# Patient Record
Sex: Male | Born: 1950 | Race: White | Hispanic: No | Smoking: Former smoker
Health system: Southern US, Community
[De-identification: ages and names within clinical notes are randomized; demographics above are authoritative.]

## PROBLEM LIST (undated history)

## (undated) DIAGNOSIS — I10 Essential (primary) hypertension: Secondary | ICD-10-CM

## (undated) DIAGNOSIS — M1990 Unspecified osteoarthritis, unspecified site: Secondary | ICD-10-CM

## (undated) DIAGNOSIS — C679 Malignant neoplasm of bladder, unspecified: Secondary | ICD-10-CM

## (undated) DIAGNOSIS — G510 Bell's palsy: Secondary | ICD-10-CM

## (undated) DIAGNOSIS — G459 Transient cerebral ischemic attack, unspecified: Secondary | ICD-10-CM

## (undated) DIAGNOSIS — M48061 Spinal stenosis, lumbar region without neurogenic claudication: Secondary | ICD-10-CM

## (undated) DIAGNOSIS — K219 Gastro-esophageal reflux disease without esophagitis: Secondary | ICD-10-CM

## (undated) DIAGNOSIS — G4730 Sleep apnea, unspecified: Secondary | ICD-10-CM

## (undated) DIAGNOSIS — M109 Gout, unspecified: Secondary | ICD-10-CM

## (undated) DIAGNOSIS — N400 Enlarged prostate without lower urinary tract symptoms: Secondary | ICD-10-CM

## (undated) DIAGNOSIS — M5416 Radiculopathy, lumbar region: Secondary | ICD-10-CM

## (undated) HISTORY — PX: JOINT REPLACEMENT: SHX530

## (undated) HISTORY — PX: CYSTOSCOPY: SUR368

## (undated) HISTORY — PX: TONSILLECTOMY: SUR1361

## (undated) HISTORY — PX: KNEE ARTHROSCOPY: SUR90

---

## 2012-05-19 DIAGNOSIS — G510 Bell's palsy: Secondary | ICD-10-CM

## 2012-05-19 HISTORY — DX: Bell's palsy: G51.0

## 2014-05-19 HISTORY — PX: SHOULDER ARTHROSCOPY: SHX128

## 2015-05-20 HISTORY — PX: BLADDER TUMOR EXCISION: SHX238

## 2015-05-20 HISTORY — PX: BLEPHAROPLASTY: SUR158

## 2015-12-04 DIAGNOSIS — N401 Enlarged prostate with lower urinary tract symptoms: Secondary | ICD-10-CM | POA: Insufficient documentation

## 2015-12-04 DIAGNOSIS — C679 Malignant neoplasm of bladder, unspecified: Secondary | ICD-10-CM | POA: Insufficient documentation

## 2015-12-04 DIAGNOSIS — N138 Other obstructive and reflux uropathy: Secondary | ICD-10-CM | POA: Insufficient documentation

## 2016-07-14 DIAGNOSIS — M25562 Pain in left knee: Secondary | ICD-10-CM | POA: Insufficient documentation

## 2016-07-14 DIAGNOSIS — G8929 Other chronic pain: Secondary | ICD-10-CM | POA: Insufficient documentation

## 2016-11-06 ENCOUNTER — Other Ambulatory Visit: Payer: Self-pay | Admitting: Orthopedic Surgery

## 2016-11-26 NOTE — Pre-Procedure Instructions (Signed)
Niki Cosman  11/26/2016      [id number] [place], [place] [address] [place] [address] Phone: [phone] Fax: [phone]    Your procedure is scheduled on July 23  Report to [facility] Admitting at 0800 A.M.  Call this number if you have problems the morning of surgery:  [phone]   Remember:  Do not eat food or drink liquids after midnight.   Take these medicines the morning of surgery with A SIP OF WATER allopurinol (ZYLOPRIM),  amLODipine (NORVASC), eye drops, metoprolol succinate (TOPROL-XL), pantoprazole (PROTONIX), tamsulosin (FLOMAX)  7 days prior to surgery STOP taking any meloxicam (MOBIC), Aleve, Naproxen, Ibuprofen, Motrin, Advil, Goody's, BC's, all herbal medications, fish oil, and all vitamins  Follow your doctors instructions regarding your Aspirin.  If no instructions were given by the doctor you will need to call the office to get instructions.  Your pre admission RN will also call for those instructions    Do not wear jewelry  Do not wear lotions, powders, or cologne, or deoderant.  Men may shave face and neck.  Do not bring valuables to the hospital.  [facility] is not responsible for any belongings or valuables.  Contacts, dentures or bridgework may not be worn into surgery.  Leave your suitcase in the car.  After surgery it may be brought to your room.  For patients admitted to the hospital, discharge time will be determined by your treatment team.  Patients discharged the day of surgery will not be allowed to drive home.    Special instructions:   [place]- Preparing For Surgery  Before surgery, you can play an important role. Because skin is not sterile, your skin needs to be as free of germs as possible. You can reduce the number of germs on your skin by washing with CHG (chlorahexidine gluconate) Soap before surgery.  CHG is an antiseptic cleaner which kills germs and  bonds with the skin to continue killing germs even after washing.  Please do not use if you have an allergy to CHG or antibacterial soaps. If your skin becomes reddened/irritated stop using the CHG.  Do not shave (including legs and underarms) for at least 48 hours prior to first CHG shower. It is OK to shave your face.  Please follow these instructions carefully.   1. Shower the NIGHT BEFORE SURGERY and the MORNING OF SURGERY with CHG.   2. If you chose to wash your hair, wash your hair first as usual with your normal shampoo.  3. After you shampoo, rinse your hair and body thoroughly to remove the shampoo.  4. Use CHG as you would any other liquid soap. You can apply CHG directly to the skin and wash gently with a scrungie or a clean washcloth.   5. Apply the CHG Soap to your body ONLY FROM THE NECK DOWN.  Do not use on open wounds or open sores. Avoid contact with your eyes, ears, mouth and genitals (private parts). Wash genitals (private parts) with your normal soap.  6. Wash thoroughly, paying special attention to the area where your surgery will be performed.  7. Thoroughly rinse your body with warm water from the neck down.  8. DO NOT shower/wash with your normal soap after using and rinsing off the CHG Soap.  9. Pat yourself dry with a CLEAN TOWEL.   10. Wear CLEAN PAJAMAS   11. Place CLEAN  SHEETS on your bed the night of your first shower and DO NOT SLEEP WITH PETS.    Day of Surgery: Do not apply any deodorants/lotions. Please wear clean clothes to the hospital/surgery center.      Please read over the following fact sheets that you were given.

## 2016-11-27 ENCOUNTER — Encounter (HOSPITAL_COMMUNITY)
Admission: RE | Admit: 2016-11-27 | Discharge: 2016-11-27 | Disposition: A | Discharge status: Home / Self Care | Payer: Federal, State, Local not specified - PPO | Source: Ambulatory Visit | Attending: Orthopedic Surgery | Admitting: Orthopedic Surgery

## 2016-11-27 ENCOUNTER — Encounter (HOSPITAL_COMMUNITY): Payer: Self-pay

## 2016-11-27 DIAGNOSIS — Z01812 Encounter for preprocedural laboratory examination: Secondary | ICD-10-CM | POA: Diagnosis not present

## 2016-11-27 HISTORY — DX: Sleep apnea, unspecified: G47.30

## 2016-11-27 HISTORY — DX: Essential (primary) hypertension: I10

## 2016-11-27 HISTORY — DX: Unspecified osteoarthritis, unspecified site: M19.90

## 2016-11-27 HISTORY — DX: Gastro-esophageal reflux disease without esophagitis: K21.9

## 2016-11-27 LAB — CBC WITH DIFFERENTIAL/PLATELET
BASOS ABS: 0 10*3/uL (ref 0.0–0.1)
Basophils Relative: 0 %
EOS PCT: 1 %
Eosinophils Absolute: 0.1 10*3/uL (ref 0.0–0.7)
HCT: 41.7 % (ref 39.0–52.0)
Hemoglobin: 14.4 g/dL (ref 13.0–17.0)
LYMPHS PCT: 27 %
Lymphs Abs: 1.8 10*3/uL (ref 0.7–4.0)
MCH: 31 pg (ref 26.0–34.0)
MCHC: 34.5 g/dL (ref 30.0–36.0)
MCV: 89.7 fL (ref 78.0–100.0)
MONO ABS: 0.4 10*3/uL (ref 0.1–1.0)
MONOS PCT: 5 %
Neutro Abs: 4.6 10*3/uL (ref 1.7–7.7)
Neutrophils Relative %: 67 %
PLATELETS: 172 10*3/uL (ref 150–400)
RBC: 4.65 MIL/uL (ref 4.22–5.81)
RDW: 13.6 % (ref 11.5–15.5)
WBC: 6.8 10*3/uL (ref 4.0–10.5)

## 2016-11-27 LAB — COMPREHENSIVE METABOLIC PANEL
ALT: 23 U/L (ref 17–63)
ANION GAP: 8 (ref 5–15)
AST: 20 U/L (ref 15–41)
Albumin: 3.8 g/dL (ref 3.5–5.0)
Alkaline Phosphatase: 47 U/L (ref 38–126)
BILIRUBIN TOTAL: 0.7 mg/dL (ref 0.3–1.2)
BUN: 19 mg/dL (ref 6–20)
CHLORIDE: 109 mmol/L (ref 101–111)
CO2: 23 mmol/L (ref 22–32)
Calcium: 9.4 mg/dL (ref 8.9–10.3)
Creatinine, Ser: 0.84 mg/dL (ref 0.61–1.24)
Glucose, Bld: 111 mg/dL — ABNORMAL HIGH (ref 65–99)
POTASSIUM: 3.9 mmol/L (ref 3.5–5.1)
Sodium: 140 mmol/L (ref 135–145)
TOTAL PROTEIN: 6 g/dL — AB (ref 6.5–8.1)

## 2016-11-27 LAB — SURGICAL PCR SCREEN
MRSA, PCR: NEGATIVE
STAPHYLOCOCCUS AUREUS: NEGATIVE

## 2016-11-27 NOTE — Pre-Procedure Instructions (Signed)
William [name]  11/27/2016      [id number] , [place] [address] [place] [address] Phone: [phone] Fax: [phone]    Your procedure is scheduled on July 23  Report to [facility] Admitting at 0800 A.M.  Call this number if you have problems the morning of surgery:  [phone]   Remember:  Do not eat food or drink liquids after midnight.   On 7/22- Sunday night    Take these medicines the morning of surgery with A SIP OF WATER allopurinol (ZYLOPRIM),  amLODipine (NORVASC), eye drops, metoprolol succinate (TOPROL-XL), pantoprazole (PROTONIX), tamsulosin (FLOMAX)  7 days prior to surgery STOP taking any meloxicam (MOBIC), Aleve, Naproxen, Ibuprofen, Motrin, Advil, Goody's, BC's, all herbal medications, fish oil, and all vitamins  Follow your doctors instructions regarding your Aspirin.  If no instructions were given by the doctor you will need to call the office to get instructions.  Your pre admission RN will also call for those instructions    Do not wear jewelry  Do not wear lotions, powders, or cologne, or deoderant.  Men may shave face and neck.  Do not bring valuables to the hospital.  [facility] is not responsible for any belongings or valuables.  Contacts, dentures or bridgework may not be worn into surgery.  Leave your suitcase in the car.  After surgery it may be brought to your room.  For patients admitted to the hospital, discharge time will be determined by your treatment team.  Patients discharged the day of surgery will not be allowed to drive home.    Special instructions:   [place]- Preparing For Surgery  Before surgery, you can play an important role. Because skin is not sterile, your skin needs to be as free of germs as possible. You can reduce the number of germs on your skin by washing with CHG (chlorahexidine gluconate) Soap before surgery.  CHG is an antiseptic  cleaner which kills germs and bonds with the skin to continue killing germs even after washing.  Please do not use if you have an allergy to CHG or antibacterial soaps. If your skin becomes reddened/irritated stop using the CHG.  Do not shave (including legs and underarms) for at least 48 hours prior to first CHG shower. It is OK to shave your face.  Please follow these instructions carefully.   1. Shower the NIGHT BEFORE SURGERY and the MORNING OF SURGERY with CHG.   2. If you chose to wash your hair, wash your hair first as usual with your normal shampoo.  3. After you shampoo, rinse your hair and body thoroughly to remove the shampoo.  4. Use CHG as you would any other liquid soap. You can apply CHG directly to the skin and wash gently with a scrungie or a clean washcloth.   5. Apply the CHG Soap to your body ONLY FROM THE NECK DOWN.  Do not use on open wounds or open sores. Avoid contact with your eyes, ears, mouth and genitals (private parts). Wash genitals (private parts) with your normal soap.  6. Wash thoroughly, paying special attention to the area where your surgery will be performed.  7. Thoroughly rinse your body with warm water from the neck down.  8. DO NOT shower/wash with your normal soap after using and rinsing off the CHG Soap.  9. Pat yourself dry with a CLEAN TOWEL.   10.  [place]   11. Place CLEAN SHEETS on your bed the night of your first shower and DO NOT SLEEP WITH PETS.    Day of Surgery: Do not apply any deodorants/lotions. Please wear clean clothes to the hospital/surgery center.      Please read over the following fact sheets that you were given.

## 2016-11-27 NOTE — Progress Notes (Signed)
Pt. explains that he went to PCP- Dr. Humphrey Rolls with [facility]. For surg. Clearance.  Ekg done & found to have some concern.  Pt. Referred to [facility]. & the stress test done proved to be negative. Requesting EKG & stress test. Pt. Denies all concerns for changes in his chest.

## 2016-11-28 NOTE — Progress Notes (Addendum)
Anesthesia Chart Review:  Pt is a 66 year old male scheduled for R total knee arthroplasty on 12/08/16 with Vickey Huger, M.D.  - PCP is Bertram Millard, MD who cleared pt for surgery.   PMH includes: HTN, OSA, GERD. Former smoker. BMI 28.  Medications include: Amlodipine, ASA 81 mg, Lipitor, losartan, metoprolol, Protonix  BP 136/73   Pulse 77   Temp (!) 36.3 C   Resp 20   Ht 5' 11.5" (1.816 m)   Wt 201 lb 9.6 oz (91.4 kg)   SpO2 96%   BMI 27.73 kg/m    Preoperative labs reviewed.    EKG 10/17/16: sinus rhythm with borderline 1st degree AV block. RAD.  Lateral infarct, age undetermined.    Nuclear stress test 10/22/16 ([place]): 1. No ischemia seen on the scan. 2. Normal gated images. 3. Normal EF calculated to be 64%.  If no changes, I anticipate pt can proceed with surgery as scheduled.   Willeen Cass, FNP-BC [facility] Short Stay Surgical Center/Anesthesiology Phone: [phone] 12/01/2016 9:20 AM

## 2016-12-05 MED ORDER — GABAPENTIN 300 MG PO CAPS
300.0000 mg | ORAL_CAPSULE | Freq: Once | ORAL | Status: AC
  Administered 2016-12-08: 300 mg via ORAL
  Filled 2016-12-05: qty 1

## 2016-12-05 MED ORDER — ACETAMINOPHEN 500 MG PO TABS
1000.0000 mg | ORAL_TABLET | Freq: Once | ORAL | Status: AC
  Administered 2016-12-08: 1000 mg via ORAL
  Filled 2016-12-05: qty 2

## 2016-12-05 MED ORDER — TRANEXAMIC ACID 1000 MG/10ML IV SOLN
1000.0000 mg | INTRAVENOUS | Status: AC
  Administered 2016-12-08: 1000 mg via INTRAVENOUS
  Filled 2016-12-05: qty 1100

## 2016-12-05 MED ORDER — CEFAZOLIN SODIUM-DEXTROSE 2-4 GM/100ML-% IV SOLN
2.0000 g | INTRAVENOUS | Status: AC
  Administered 2016-12-08: 2 g via INTRAVENOUS
  Filled 2016-12-05: qty 100

## 2016-12-05 MED ORDER — BUPIVACAINE LIPOSOME 1.3 % IJ SUSP
20.0000 mL | INTRAMUSCULAR | Status: AC
  Administered 2016-12-08: 20 mL
  Filled 2016-12-05: qty 20

## 2016-12-05 MED ORDER — DEXAMETHASONE SODIUM PHOSPHATE 10 MG/ML IJ SOLN
8.0000 mg | INTRAMUSCULAR | Status: AC
  Administered 2016-12-08: 8 mg via INTRAVENOUS
  Filled 2016-12-05: qty 1

## 2016-12-08 ENCOUNTER — Encounter (HOSPITAL_COMMUNITY)
Admission: RE | Disposition: A | Discharge status: Home / Self Care | Payer: Self-pay | Source: Ambulatory Visit | Attending: Orthopedic Surgery

## 2016-12-08 ENCOUNTER — Ambulatory Visit (HOSPITAL_COMMUNITY): Payer: Federal, State, Local not specified - PPO | Admitting: Certified Registered"

## 2016-12-08 ENCOUNTER — Observation Stay (HOSPITAL_COMMUNITY)
Admission: RE | Admit: 2016-12-08 | Discharge: 2016-12-09 | Disposition: A | Discharge status: Home / Self Care | Payer: Federal, State, Local not specified - PPO | Source: Ambulatory Visit | Attending: Orthopedic Surgery | Admitting: Orthopedic Surgery

## 2016-12-08 ENCOUNTER — Ambulatory Visit (HOSPITAL_COMMUNITY): Payer: Federal, State, Local not specified - PPO | Admitting: Emergency Medicine

## 2016-12-08 ENCOUNTER — Encounter (HOSPITAL_COMMUNITY): Payer: Self-pay | Admitting: Urology

## 2016-12-08 DIAGNOSIS — M1711 Unilateral primary osteoarthritis, right knee: Secondary | ICD-10-CM | POA: Diagnosis not present

## 2016-12-08 DIAGNOSIS — I10 Essential (primary) hypertension: Secondary | ICD-10-CM | POA: Diagnosis not present

## 2016-12-08 DIAGNOSIS — Z87891 Personal history of nicotine dependence: Secondary | ICD-10-CM | POA: Insufficient documentation

## 2016-12-08 DIAGNOSIS — Z882 Allergy status to sulfonamides status: Secondary | ICD-10-CM | POA: Insufficient documentation

## 2016-12-08 DIAGNOSIS — G4730 Sleep apnea, unspecified: Secondary | ICD-10-CM | POA: Insufficient documentation

## 2016-12-08 DIAGNOSIS — Z79899 Other long term (current) drug therapy: Secondary | ICD-10-CM | POA: Insufficient documentation

## 2016-12-08 DIAGNOSIS — Z9889 Other specified postprocedural states: Secondary | ICD-10-CM | POA: Diagnosis not present

## 2016-12-08 DIAGNOSIS — K219 Gastro-esophageal reflux disease without esophagitis: Secondary | ICD-10-CM | POA: Diagnosis not present

## 2016-12-08 DIAGNOSIS — Z96659 Presence of unspecified artificial knee joint: Secondary | ICD-10-CM

## 2016-12-08 HISTORY — PX: TOTAL KNEE ARTHROPLASTY: SHX125

## 2016-12-08 SURGERY — ARTHROPLASTY, KNEE, TOTAL
Anesthesia: Spinal | Site: Knee | Laterality: Right

## 2016-12-08 MED ORDER — PROMETHAZINE HCL 25 MG/ML IJ SOLN: 6.2500 mg | INTRAMUSCULAR | Status: DC | PRN

## 2016-12-08 MED ORDER — ASPIRIN EC 325 MG PO TBEC
325.0000 mg | DELAYED_RELEASE_TABLET | Freq: Two times a day (BID) | ORAL | Status: DC
  Administered 2016-12-08 – 2016-12-09 (×2): 325 mg via ORAL
  Filled 2016-12-08 (×2): qty 1

## 2016-12-08 MED ORDER — LOSARTAN POTASSIUM 50 MG PO TABS
100.0000 mg | ORAL_TABLET | Freq: Every day | ORAL | Status: DC
  Administered 2016-12-09: 100 mg via ORAL
  Filled 2016-12-08: qty 2

## 2016-12-08 MED ORDER — ONDANSETRON HCL 4 MG/2ML IJ SOLN
INTRAMUSCULAR | Status: DC | PRN
  Administered 2016-12-08: 4 mg via INTRAVENOUS

## 2016-12-08 MED ORDER — FENTANYL CITRATE (PF) 100 MCG/2ML IJ SOLN
INTRAMUSCULAR | Status: AC
  Administered 2016-12-08: 50 ug via INTRAVENOUS
  Filled 2016-12-08: qty 2

## 2016-12-08 MED ORDER — ONDANSETRON HCL 4 MG/2ML IJ SOLN
INTRAMUSCULAR | Status: AC
  Filled 2016-12-08: qty 2

## 2016-12-08 MED ORDER — SODIUM CHLORIDE 0.9 % IV SOLN
1000.0000 mg | Freq: Once | INTRAVENOUS | Status: AC
  Administered 2016-12-08: 1000 mg via INTRAVENOUS
  Filled 2016-12-08: qty 10

## 2016-12-08 MED ORDER — HYDROMORPHONE HCL 1 MG/ML IJ SOLN: 0.2500 mg | INTRAMUSCULAR | Status: DC | PRN

## 2016-12-08 MED ORDER — FLEET ENEMA 7-19 GM/118ML RE ENEM: 1.0000 | ENEMA | Freq: Once | RECTAL | Status: DC | PRN

## 2016-12-08 MED ORDER — EPHEDRINE 5 MG/ML INJ
INTRAVENOUS | Status: AC
  Filled 2016-12-08: qty 10

## 2016-12-08 MED ORDER — CHLORHEXIDINE GLUCONATE 4 % EX LIQD: 60.0000 mL | Freq: Once | CUTANEOUS | Status: DC

## 2016-12-08 MED ORDER — ZOLPIDEM TARTRATE 5 MG PO TABS: 5.0000 mg | ORAL_TABLET | Freq: Every evening | ORAL | Status: DC | PRN

## 2016-12-08 MED ORDER — FENTANYL CITRATE (PF) 100 MCG/2ML IJ SOLN
50.0000 ug | Freq: Once | INTRAMUSCULAR | Status: AC
  Administered 2016-12-08: 50 ug via INTRAVENOUS

## 2016-12-08 MED ORDER — OXYCODONE HCL 5 MG PO TABS
5.0000 mg | ORAL_TABLET | ORAL | Status: DC | PRN
  Administered 2016-12-08 – 2016-12-09 (×2): 10 mg via ORAL
  Filled 2016-12-08 (×3): qty 2

## 2016-12-08 MED ORDER — PROPOFOL 500 MG/50ML IV EMUL
INTRAVENOUS | Status: DC | PRN
  Administered 2016-12-08: 75 ug/kg/min via INTRAVENOUS

## 2016-12-08 MED ORDER — BISACODYL 5 MG PO TBEC: 5.0000 mg | DELAYED_RELEASE_TABLET | Freq: Every day | ORAL | Status: DC | PRN

## 2016-12-08 MED ORDER — MENTHOL 3 MG MT LOZG: 1.0000 | LOZENGE | OROMUCOSAL | Status: DC | PRN

## 2016-12-08 MED ORDER — CEFAZOLIN SODIUM-DEXTROSE 1-4 GM/50ML-% IV SOLN
1.0000 g | Freq: Four times a day (QID) | INTRAVENOUS | Status: AC
  Administered 2016-12-08 – 2016-12-09 (×2): 1 g via INTRAVENOUS
  Filled 2016-12-08 (×2): qty 50

## 2016-12-08 MED ORDER — DOCUSATE SODIUM 100 MG PO CAPS
100.0000 mg | ORAL_CAPSULE | Freq: Two times a day (BID) | ORAL | Status: DC
  Administered 2016-12-08 – 2016-12-09 (×2): 100 mg via ORAL
  Filled 2016-12-08 (×2): qty 1

## 2016-12-08 MED ORDER — MIDAZOLAM HCL 2 MG/2ML IJ SOLN
INTRAMUSCULAR | Status: AC
  Filled 2016-12-08: qty 2

## 2016-12-08 MED ORDER — ACETAMINOPHEN 500 MG PO TABS
1000.0000 mg | ORAL_TABLET | Freq: Four times a day (QID) | ORAL | Status: AC
  Administered 2016-12-08 – 2016-12-09 (×4): 1000 mg via ORAL
  Filled 2016-12-08 (×4): qty 2

## 2016-12-08 MED ORDER — PANTOPRAZOLE SODIUM 40 MG PO TBEC
40.0000 mg | DELAYED_RELEASE_TABLET | Freq: Every day | ORAL | Status: DC
  Administered 2016-12-09: 40 mg via ORAL
  Filled 2016-12-08: qty 1

## 2016-12-08 MED ORDER — BUPIVACAINE IN DEXTROSE 0.75-8.25 % IT SOLN
INTRATHECAL | Status: DC | PRN
  Administered 2016-12-08: 2 mL via INTRATHECAL

## 2016-12-08 MED ORDER — SODIUM CHLORIDE 0.9 % IJ SOLN
INTRAMUSCULAR | Status: DC | PRN
  Administered 2016-12-08: 20 mL

## 2016-12-08 MED ORDER — PHENYLEPHRINE HCL 10 MG/ML IJ SOLN
INTRAMUSCULAR | Status: DC | PRN
  Administered 2016-12-08: 80 ug via INTRAVENOUS
  Administered 2016-12-08: 40 ug via INTRAVENOUS

## 2016-12-08 MED ORDER — ACETAMINOPHEN 650 MG RE SUPP: 650.0000 mg | Freq: Four times a day (QID) | RECTAL | Status: DC | PRN

## 2016-12-08 MED ORDER — PHENOL 1.4 % MT LIQD: 1.0000 | OROMUCOSAL | Status: DC | PRN

## 2016-12-08 MED ORDER — METOCLOPRAMIDE HCL 5 MG/ML IJ SOLN: 5.0000 mg | Freq: Three times a day (TID) | INTRAMUSCULAR | Status: DC | PRN

## 2016-12-08 MED ORDER — ALUM & MAG HYDROXIDE-SIMETH 200-200-20 MG/5ML PO SUSP: 30.0000 mL | ORAL | Status: DC | PRN

## 2016-12-08 MED ORDER — DEXAMETHASONE SODIUM PHOSPHATE 10 MG/ML IJ SOLN
10.0000 mg | Freq: Once | INTRAMUSCULAR | Status: AC
  Administered 2016-12-09: 10 mg via INTRAVENOUS
  Filled 2016-12-08: qty 1

## 2016-12-08 MED ORDER — METOPROLOL SUCCINATE ER 100 MG PO TB24
100.0000 mg | ORAL_TABLET | Freq: Every day | ORAL | Status: DC
  Administered 2016-12-09: 100 mg via ORAL
  Filled 2016-12-08: qty 1

## 2016-12-08 MED ORDER — AMLODIPINE BESYLATE 5 MG PO TABS
5.0000 mg | ORAL_TABLET | Freq: Every day | ORAL | Status: DC
  Administered 2016-12-09: 5 mg via ORAL
  Filled 2016-12-08: qty 1

## 2016-12-08 MED ORDER — MIDAZOLAM HCL 2 MG/2ML IJ SOLN
INTRAMUSCULAR | Status: AC
  Administered 2016-12-08: 1 mg via INTRAVENOUS
  Filled 2016-12-08: qty 2

## 2016-12-08 MED ORDER — METOCLOPRAMIDE HCL 5 MG PO TABS: 5.0000 mg | ORAL_TABLET | Freq: Three times a day (TID) | ORAL | Status: DC | PRN

## 2016-12-08 MED ORDER — SODIUM CHLORIDE 0.9 % IR SOLN
Status: DC | PRN
  Administered 2016-12-08: 1000 mL
  Administered 2016-12-08: 3000 mL

## 2016-12-08 MED ORDER — HYDROCODONE-ACETAMINOPHEN 7.5-325 MG PO TABS
1.0000 | ORAL_TABLET | Freq: Four times a day (QID) | ORAL | Status: DC
  Administered 2016-12-08 – 2016-12-09 (×3): 1 via ORAL
  Filled 2016-12-08 (×3): qty 1

## 2016-12-08 MED ORDER — METHOCARBAMOL 1000 MG/10ML IJ SOLN
500.0000 mg | Freq: Four times a day (QID) | INTRAVENOUS | Status: DC | PRN
  Filled 2016-12-08: qty 5

## 2016-12-08 MED ORDER — METHOCARBAMOL 500 MG PO TABS
500.0000 mg | ORAL_TABLET | Freq: Four times a day (QID) | ORAL | Status: DC | PRN
  Administered 2016-12-08: 500 mg via ORAL
  Filled 2016-12-08 (×2): qty 1

## 2016-12-08 MED ORDER — ONDANSETRON HCL 4 MG PO TABS: 4.0000 mg | ORAL_TABLET | Freq: Four times a day (QID) | ORAL | Status: DC | PRN

## 2016-12-08 MED ORDER — HYDROMORPHONE HCL 1 MG/ML IJ SOLN: 1.0000 mg | INTRAMUSCULAR | Status: DC | PRN

## 2016-12-08 MED ORDER — PHENYLEPHRINE 40 MCG/ML (10ML) SYRINGE FOR IV PUSH (FOR BLOOD PRESSURE SUPPORT)
PREFILLED_SYRINGE | INTRAVENOUS | Status: AC
  Filled 2016-12-08: qty 10

## 2016-12-08 MED ORDER — FENTANYL CITRATE (PF) 250 MCG/5ML IJ SOLN
INTRAMUSCULAR | Status: AC
  Filled 2016-12-08: qty 5

## 2016-12-08 MED ORDER — BUPIVACAINE-EPINEPHRINE (PF) 0.25% -1:200000 IJ SOLN
INTRAMUSCULAR | Status: DC | PRN
  Administered 2016-12-08: 30 mL

## 2016-12-08 MED ORDER — DIPHENHYDRAMINE HCL 12.5 MG/5ML PO ELIX: 12.5000 mg | ORAL_SOLUTION | ORAL | Status: DC | PRN

## 2016-12-08 MED ORDER — ACETAMINOPHEN 325 MG PO TABS: 650.0000 mg | ORAL_TABLET | Freq: Four times a day (QID) | ORAL | Status: DC | PRN

## 2016-12-08 MED ORDER — SENNOSIDES-DOCUSATE SODIUM 8.6-50 MG PO TABS: 1.0000 | ORAL_TABLET | Freq: Every evening | ORAL | Status: DC | PRN

## 2016-12-08 MED ORDER — GABAPENTIN 300 MG PO CAPS
300.0000 mg | ORAL_CAPSULE | Freq: Three times a day (TID) | ORAL | Status: DC
  Administered 2016-12-08 – 2016-12-09 (×2): 300 mg via ORAL
  Filled 2016-12-08 (×2): qty 1

## 2016-12-08 MED ORDER — PROPOFOL 10 MG/ML IV BOLUS
INTRAVENOUS | Status: AC
  Filled 2016-12-08: qty 20

## 2016-12-08 MED ORDER — ONDANSETRON HCL 4 MG/2ML IJ SOLN: 4.0000 mg | Freq: Four times a day (QID) | INTRAMUSCULAR | Status: DC | PRN

## 2016-12-08 MED ORDER — TAMSULOSIN HCL 0.4 MG PO CAPS
0.4000 mg | ORAL_CAPSULE | Freq: Two times a day (BID) | ORAL | Status: DC
  Administered 2016-12-08 – 2016-12-09 (×2): 0.4 mg via ORAL
  Filled 2016-12-08 (×2): qty 1

## 2016-12-08 MED ORDER — ATORVASTATIN CALCIUM 10 MG PO TABS
10.0000 mg | ORAL_TABLET | Freq: Every day | ORAL | Status: DC
  Administered 2016-12-09: 10 mg via ORAL
  Filled 2016-12-08: qty 1

## 2016-12-08 MED ORDER — ALLOPURINOL 100 MG PO TABS
100.0000 mg | ORAL_TABLET | Freq: Every day | ORAL | Status: DC
  Administered 2016-12-09: 100 mg via ORAL
  Filled 2016-12-08: qty 1

## 2016-12-08 MED ORDER — EPHEDRINE SULFATE 50 MG/ML IJ SOLN
INTRAMUSCULAR | Status: DC | PRN
  Administered 2016-12-08 (×2): 10 mg via INTRAVENOUS
  Administered 2016-12-08: 5 mg via INTRAVENOUS

## 2016-12-08 MED ORDER — LACTATED RINGERS IV SOLN
INTRAVENOUS | Status: DC
  Administered 2016-12-08 (×2): via INTRAVENOUS

## 2016-12-08 MED ORDER — MIDAZOLAM HCL 2 MG/2ML IJ SOLN
1.0000 mg | Freq: Once | INTRAMUSCULAR | Status: AC
  Administered 2016-12-08: 1 mg via INTRAVENOUS

## 2016-12-08 MED ORDER — FENTANYL CITRATE (PF) 100 MCG/2ML IJ SOLN
INTRAMUSCULAR | Status: DC | PRN
  Administered 2016-12-08: 50 ug via INTRAVENOUS

## 2016-12-08 SURGICAL SUPPLY — 61 items
BANDAGE ACE 6X5 VEL STRL LF (GAUZE/BANDAGES/DRESSINGS) ×3 IMPLANT
BANDAGE ELASTIC 6 VELCRO ST LF (GAUZE/BANDAGES/DRESSINGS) ×3 IMPLANT
BANDAGE ESMARK 6X9 LF (GAUZE/BANDAGES/DRESSINGS) ×1 IMPLANT
BLADE SAGITTAL 13X1.27X60 (BLADE) ×2 IMPLANT
BLADE SAGITTAL 13X1.27X60MM (BLADE) ×1
BLADE SAW SGTL 83.5X18.5 (BLADE) ×3 IMPLANT
BLADE SURG 10 STRL SS (BLADE) ×3 IMPLANT
BNDG ESMARK 6X9 LF (GAUZE/BANDAGES/DRESSINGS) ×3
BOWL SMART MIX CTS (DISPOSABLE) ×3 IMPLANT
CAPT KNEE TOTAL 3 ×3 IMPLANT
CEMENT BONE SIMPLEX SPEEDSET (Cement) ×6 IMPLANT
CLOSURE WOUND 1/2 X4 (GAUZE/BANDAGES/DRESSINGS) ×1
COVER SURGICAL LIGHT HANDLE (MISCELLANEOUS) ×3 IMPLANT
CUFF TOURNIQUET SINGLE 34IN LL (TOURNIQUET CUFF) ×3 IMPLANT
DRAPE EXTREMITY T 121X128X90 (DRAPE) ×3 IMPLANT
DRAPE HALF SHEET 40X57 (DRAPES) ×3 IMPLANT
DRAPE INCISE IOBAN 66X45 STRL (DRAPES) ×6 IMPLANT
DRAPE U-SHAPE 47X51 STRL (DRAPES) ×3 IMPLANT
DRSG AQUACEL AG ADV 3.5X10 (GAUZE/BANDAGES/DRESSINGS) ×3 IMPLANT
DURAPREP 26ML APPLICATOR (WOUND CARE) ×6 IMPLANT
ELECT REM PT RETURN 9FT ADLT (ELECTROSURGICAL) ×3
ELECTRODE REM PT RTRN 9FT ADLT (ELECTROSURGICAL) ×1 IMPLANT
FILTER STRAW FLUID ASPIR (MISCELLANEOUS) IMPLANT
GLOVE BIOGEL M 7.0 STRL (GLOVE) ×3 IMPLANT
GLOVE BIOGEL PI IND STRL 7.5 (GLOVE) ×1 IMPLANT
GLOVE BIOGEL PI IND STRL 8.5 (GLOVE) ×1 IMPLANT
GLOVE BIOGEL PI INDICATOR 7.5 (GLOVE) ×2
GLOVE BIOGEL PI INDICATOR 8.5 (GLOVE) ×2
GLOVE SURG ORTHO 8.0 STRL STRW (GLOVE) ×6 IMPLANT
GOWN STRL REUS W/ TWL LRG LVL3 (GOWN DISPOSABLE) ×1 IMPLANT
GOWN STRL REUS W/ TWL XL LVL3 (GOWN DISPOSABLE) ×2 IMPLANT
GOWN STRL REUS W/TWL 2XL LVL3 (GOWN DISPOSABLE) ×3 IMPLANT
GOWN STRL REUS W/TWL LRG LVL3 (GOWN DISPOSABLE) ×2
GOWN STRL REUS W/TWL XL LVL3 (GOWN DISPOSABLE) ×4
HANDPIECE INTERPULSE COAX TIP (DISPOSABLE) ×2
HOOD PEEL AWAY FACE SHEILD DIS (HOOD) ×9 IMPLANT
KIT BASIN OR (CUSTOM PROCEDURE TRAY) ×3 IMPLANT
KIT ROOM TURNOVER OR (KITS) ×3 IMPLANT
KNEE CAPITATED TOTAL 3 ×1 IMPLANT
MANIFOLD NEPTUNE II (INSTRUMENTS) ×3 IMPLANT
NEEDLE 18GX1X1/2 (RX/OR ONLY) (NEEDLE) IMPLANT
NEEDLE 22X1 1/2 (OR ONLY) (NEEDLE) ×6 IMPLANT
NS IRRIG 1000ML POUR BTL (IV SOLUTION) ×3 IMPLANT
PACK TOTAL JOINT (CUSTOM PROCEDURE TRAY) ×3 IMPLANT
PAD ARMBOARD 7.5X6 YLW CONV (MISCELLANEOUS) ×3 IMPLANT
SET HNDPC FAN SPRY TIP SCT (DISPOSABLE) ×1 IMPLANT
STRIP CLOSURE SKIN 1/2X4 (GAUZE/BANDAGES/DRESSINGS) ×2 IMPLANT
SUCTION FRAZIER HANDLE 10FR (MISCELLANEOUS)
SUCTION TUBE FRAZIER 10FR DISP (MISCELLANEOUS) IMPLANT
SUT MNCRL AB 3-0 PS2 18 (SUTURE) ×3 IMPLANT
SUT VIC AB 0 CTB1 27 (SUTURE) ×6 IMPLANT
SUT VIC AB 1 CT1 27 (SUTURE) ×6
SUT VIC AB 1 CT1 27XBRD ANBCTR (SUTURE) ×3 IMPLANT
SUT VIC AB 2-0 CT1 27 (SUTURE) ×4
SUT VIC AB 2-0 CT1 TAPERPNT 27 (SUTURE) ×2 IMPLANT
SYR 20CC LL (SYRINGE) ×6 IMPLANT
SYR TB 1ML LUER SLIP (SYRINGE) IMPLANT
TOWEL OR 17X24 6PK STRL BLUE (TOWEL DISPOSABLE) ×3 IMPLANT
TOWEL OR 17X26 10 PK STRL BLUE (TOWEL DISPOSABLE) ×3 IMPLANT
TRAY FOLEY CATH 14FR (SET/KITS/TRAYS/PACK) ×3 IMPLANT
WRAP KNEE MAXI GEL POST OP (GAUZE/BANDAGES/DRESSINGS) ×3 IMPLANT

## 2016-12-08 NOTE — Transfer of Care (Signed)
Immediate Anesthesia Transfer of Care Note  Patient: William [name]  Procedure(s) Performed: Procedure(s): TOTAL KNEE ARTHROPLASTY (Right)  Patient Location: PACU  Anesthesia Type:MAC and Spinal  Level of Consciousness: drowsy and patient cooperative  Airway & Oxygen Therapy: Patient Spontanous Breathing and Patient connected to nasal cannula oxygen  Post-op Assessment: Report given to RN  Post vital signs: Reviewed and stable  Last Vitals:  Vitals:   12/08/16 1055 12/08/16 1100  BP: (!) 115/59 (!) 138/54  Pulse: (!) 59 (!) 59  Resp: 19 17  Temp:      Last Pain:  Vitals:   12/08/16 0810  TempSrc: Oral         Complications: No apparent anesthesia complications

## 2016-12-08 NOTE — Evaluation (Addendum)
Physical Therapy Evaluation Patient Details Name: William [name] MRN: [id number] DOB: 06/20/1950 Today's Date: 12/08/2016   History of Present Illness  pt is a 66 y/o male with pmh of HTN, leep apnea and OA of bil knees, admitted for elective R TKA.  Clinical Impression  Pt admitted with/for elective R tKA.  Pt currently limited functionally due to the problems listed below.  (see problems list.)  Pt will benefit from PT to maximize function and safety to be able to get home safely with available assist .     Follow Up Recommendations DC plan and follow up therapy as arranged by surgeon    Equipment Recommendations  None recommended by PT    Recommendations for Other Services       Precautions / Restrictions Precautions Precautions: Knee Restrictions Weight Bearing Restrictions: Yes RLE Weight Bearing: Weight bearing as tolerated      Mobility  Bed Mobility Overal bed mobility: Needs Assistance Bed Mobility: Supine to Sit;Sit to Supine     Supine to sit: Supervision        Transfers Overall transfer level: Needs assistance   Transfers: Sit to/from Stand Sit to Stand: Min guard         General transfer comment: cues for hand placement and general technique  Ambulation/Gait Ambulation/Gait assistance: Min guard Ambulation Distance (Feet): 150 Feet Assistive device: Rolling walker (2 wheeled) Gait Pattern/deviations: Step-to pattern   Gait velocity interpretation: Below normal speed for age/gender General Gait Details: generally steady with good R knee control. Cues for sequencing   Stairs            Wheelchair Mobility    Modified Rankin (Stroke Patients Only)       Balance Overall balance assessment: No apparent balance deficits (not formally assessed)                                           Pertinent Vitals/Pain Pain Assessment: Faces Faces Pain Scale: Hurts even more Pain Location: R knee Pain Descriptors /  Indicators: Aching;Sore Pain Intervention(s): Monitored during session    Home Living Family/patient expects to be discharged to:: Private residence Living Arrangements: Spouse/significant other Available Help at Discharge: Family;Available 24 hours/day Type of Home: House Home Access: Stairs to enter   [employer] of Steps: 1 Home Layout: Two level;Other (Comment) (will stay on basement level initially) Home Equipment: Walker - 2 wheels;Bedside commode      Prior Function Level of Independence: Independent               Hand Dominance        Extremity/Trunk Assessment   Upper Extremity Assessment Upper Extremity Assessment: Overall WFL for tasks assessed    Lower Extremity Assessment Lower Extremity Assessment: Overall WFL for tasks assessed;RLE deficits/detail RLE Deficits / Details: functional strength for control in w/bearing       Communication   Communication: No difficulties  Cognition Arousal/Alertness: Awake/alert Behavior During Therapy: WFL for tasks assessed/performed Overall Cognitive Status: Within Functional Limits for tasks assessed                                        General Comments      Exercises Total Joint Exercises Ankle Circles/Pumps: AROM;20 reps;Supine Quad Sets: AROM;Both;10 reps;Supine Gluteal Sets: AROM;Both;10 reps;Supine  Short Arc Quad: AROM;Right;10 reps;Supine Heel Slides: AROM;Right;10 reps;Supine Hip ABduction/ADduction: AROM;Right;10 reps;Supine Straight Leg Raises: AROM;Right;10 reps;Supine Knee Flexion: AROM;Right;10 reps;Supine Goniometric ROM: 110   Assessment/Plan    PT Assessment Patient needs continued PT services  PT Problem List Decreased range of motion;Decreased activity tolerance;Decreased mobility;Decreased knowledge of use of DME;Pain       PT Treatment Interventions Gait training;Stair training;DME instruction;Functional mobility training;Therapeutic  activities;Therapeutic exercise;Patient/family education    PT Goals (Current goals can be found in the Care Plan section)  Acute Rehab PT Goals Patient Stated Goal: home independent PT Goal Formulation: With patient Time For Goal Achievement: 12/15/16 Potential to Achieve Goals: Good    Frequency 7X/week   Barriers to discharge        Co-evaluation               AM-PAC PT "6 Clicks" Daily Activity  Outcome Measure Difficulty turning over in bed (including adjusting bedclothes, sheets and blankets)?: None Difficulty moving from lying on back to sitting on the side of the bed? : None Difficulty sitting down on and standing up from a chair with arms (e.g., wheelchair, bedside commode, etc,.)?: A Little Help needed moving to and from a bed to chair (including a wheelchair)?: A Little Help needed walking in hospital room?: A Little Help needed climbing 3-5 steps with a railing? : A Little 6 Click Score: 20    End of Session   Activity Tolerance: Patient tolerated treatment well Patient left: in chair;with call bell/phone within reach;with family/visitor present Nurse Communication: Mobility status PT Visit Diagnosis: Other abnormalities of gait and mobility (R26.89);Pain Pain - Right/Left: Right Pain - part of body: Knee    Time: 1725-1750 PT Time Calculation (min) (ACUTE ONLY): 25 min   Charges:   PT Evaluation $PT Eval Low Complexity: 1 Procedure PT Treatments $Therapeutic Exercise: 8-22 mins   PT G Codes:   PT G-Codes **NOT FOR INPATIENT CLASS** Functional Assessment Tool Used: AM-PAC 6 Clicks Basic Mobility Functional Limitation: Mobility: Walking and moving around Mobility: Walking and Moving Around Current Status (K9983): At least 1 percent but less than 20 percent impaired, limited or restricted Mobility: Walking and Moving Around Goal Status [id number]): At least 1 percent but less than 20 percent impaired, limited or restricted    12/08/2016  Donnella Sham, PT [phone]  (pager)  Tessie Fass Miche Loughridge 12/08/2016, 6:03 PM

## 2016-12-08 NOTE — Anesthesia Procedure Notes (Signed)
Procedure Name: MAC Date/Time: 12/08/2016 8:28 AM Performed by: Barrington Ellison Pre-anesthesia Checklist: Patient identified, Emergency Drugs available, Suction available and Patient being monitored Patient Re-evaluated:Patient Re-evaluated prior to induction Oxygen Delivery Method: Simple face mask

## 2016-12-08 NOTE — Anesthesia Postprocedure Evaluation (Signed)
Anesthesia Post Note  Patient: William [name]  Procedure(s) Performed: Procedure(s) (LRB): TOTAL KNEE ARTHROPLASTY (Right)     Patient location during evaluation: PACU Anesthesia Type: Spinal Level of consciousness: oriented and awake and alert Pain management: pain level controlled Vital Signs Assessment: post-procedure vital signs reviewed and stable Respiratory status: spontaneous breathing, respiratory function stable and patient connected to nasal cannula oxygen Cardiovascular status: blood pressure returned to baseline and stable Postop Assessment: no headache and no backache Anesthetic complications: no    Last Vitals:  Vitals:   12/08/16 1430 12/08/16 1442  BP: (!) 147/79 (!) 146/74  Pulse: 71 71  Resp: 15 17  Temp:      Last Pain:  Vitals:   12/08/16 0810  TempSrc: Oral                 [name],Maurion TERRILL

## 2016-12-08 NOTE — Anesthesia Procedure Notes (Addendum)
Anesthesia Regional Block: Adductor canal block   Pre-Anesthetic Checklist: ,, timeout performed, Correct Patient, Correct Site, Correct Laterality, Correct Procedure, Correct Position, site marked, Risks and benefits discussed,  Surgical consent,  Pre-op evaluation,  At surgeon's request and post-op pain management  Laterality: Right and Lower  Prep: chloraprep       Needles:   Needle Type: Echogenic Stimulator Needle     Needle Length: 9cm  Needle Gauge: 21   Needle insertion depth: 6 cm   Additional Needles:   Procedures: ultrasound guided,,,,,,,,  Narrative:  Start time: 12/08/2016 10:45 AM End time: 12/08/2016 10:55 AM Injection made incrementally with aspirations every 5 mL.  Performed by: Personally  Anesthesiologist: Geffrey Michaelsen

## 2016-12-08 NOTE — Anesthesia Preprocedure Evaluation (Signed)
Anesthesia Evaluation  Patient identified by MRN, date of birth, ID band Patient awake    Reviewed: Allergy & Precautions, NPO status , Patient's Chart, lab work & pertinent test results  Airway Mallampati: II   Neck ROM: Full    Dental no notable dental hx.    Pulmonary sleep apnea , former smoker,    breath sounds clear to auscultation       Cardiovascular hypertension,  Rhythm:Regular Rate:Normal     Neuro/Psych    GI/Hepatic GERD  ,  Endo/Other  negative endocrine ROS  Renal/GU negative Renal ROS     Musculoskeletal  (+) Arthritis ,   Abdominal   Peds  Hematology   Anesthesia Other Findings   Reproductive/Obstetrics                             Anesthesia Physical Anesthesia Plan  ASA: II  Anesthesia Plan: Spinal   Post-op Pain Management:  Regional for Post-op pain   Induction:   PONV Risk Score and Plan: 2 and Ondansetron and Dexamethasone  Airway Management Planned: Simple Face Mask and Natural Airway  Additional Equipment:   Intra-op Plan:   Post-operative Plan:   Informed Consent: I have reviewed the patients History and Physical, chart, labs and discussed the procedure including the risks, benefits and alternatives for the proposed anesthesia with the patient or authorized representative who has indicated his/her understanding and acceptance.     Plan Discussed with: CRNA  Anesthesia Plan Comments:         Anesthesia Quick Evaluation

## 2016-12-08 NOTE — Progress Notes (Signed)
Orthopedic Tech Progress Note Patient Details:  William [name] Oct 12, 1950 [id number]  CPM Right Knee CPM Right Knee: On Right Knee Flexion (Degrees): 90 Right Knee Extension (Degrees): 0 Additional Comments: foot roll   [place] Pink 12/08/2016, 1:53 PM

## 2016-12-08 NOTE — H&P (Signed)
William [name] MRN:  [id number] DOB/SEX:  1950/09/19/male  CHIEF COMPLAINT:  Painful right Knee  HISTORY: Patient is a 66 y.o. male presented with a history of pain in the right knee. Onset of symptoms was gradual starting a few years ago with gradually worsening course since that time. Patient has been treated conservatively with over-the-counter NSAIDs and activity modification. Patient currently rates pain in the knee at 10 out of 10 with activity. There is pain at night.  PAST MEDICAL HISTORY: There are no active problems to display for this patient.  Past Medical History:  Diagnosis Date  . Arthritis    OA- knees   . GERD (gastroesophageal reflux disease)   . Hypertension   . Sleep apnea    CPAP-in use q night, last study 5 yrs. ago   Past Surgical History:  Procedure Laterality Date  . BLADDER TUMOR EXCISION  2017   several cystocscopy for the excision followed by M. Annitta Needs  . blepheroplasty  Bilateral 2017   in [place]  . KNEE ARTHROSCOPY     2 on each side, one being a repair of the meniscus     . SHOULDER ARTHROSCOPY Right 2016  . TONSILLECTOMY       MEDICATIONS:   No prescriptions prior to admission.    ALLERGIES:   Allergies  Allergen Reactions  . Sulfa Antibiotics     UNSPECIFIED REACTION     REVIEW OF SYSTEMS:  A comprehensive review of systems was negative except for: Musculoskeletal: positive for arthralgias and bone pain   FAMILY HISTORY:  No family history on file.  SOCIAL HISTORY:   Social History  Substance Use Topics  . Smoking status: Former Smoker    Quit date: 08/29/2002  . Smokeless tobacco: Never Used  . Alcohol use 1.2 - 1.8 oz/week    2 - 3 Shots of liquor per week     Comment: sometimes goes weeks without drinking      EXAMINATION:  Vital signs in last 24 hours:    There were no vitals taken for this visit.  General Appearance:    Alert, cooperative, no distress, appears stated age  Head:    Normocephalic, without  obvious abnormality, atraumatic  Eyes:    PERRL, conjunctiva/corneas clear, EOM's intact, fundi    benign, both eyes       Ears:    Normal TM's and external ear canals, both ears  Nose:   Nares normal, septum midline, mucosa normal, no drainage    or sinus tenderness  Throat:   Lips, mucosa, and tongue normal; teeth and gums normal  Neck:   Supple, symmetrical, trachea midline, no adenopathy;       thyroid:  No enlargement/tenderness/nodules; no carotid   bruit or JVD  Back:     Symmetric, no curvature, ROM normal, no CVA tenderness  Lungs:     Clear to auscultation bilaterally, respirations unlabored  Chest wall:    No tenderness or deformity  Heart:    Regular rate and rhythm, S1 and S2 normal, no murmur, rub   or gallop  Abdomen:     Soft, non-tender, bowel sounds active all four quadrants,    no masses, no organomegaly  Genitalia:    Normal male without lesion, discharge or tenderness  Rectal:    Normal tone, normal prostate, no masses or tenderness;   guaiac negative stool  Extremities:   Extremities normal, atraumatic, no cyanosis or edema  Pulses:   2+ and symmetric  all extremities  Skin:   Skin color, texture, turgor normal, no rashes or lesions  Lymph nodes:   Cervical, supraclavicular, and axillary nodes normal  Neurologic:   CNII-XII intact. Normal strength, sensation and reflexes      throughout    Musculoskeletal:  ROM 0-120, Ligaments intact,  Imaging Review Plain radiographs demonstrate severe degenerative joint disease of the right knee. The overall alignment is neutral. The bone quality appears to be good for age and reported activity level.  Assessment/Plan: Primary osteoarthritis, right knee   The patient history, physical examination and imaging studies are consistent with advanced degenerative joint disease of the right knee. The patient has failed conservative treatment.  The clearance notes were reviewed.  After discussion with the patient it was felt that  Total Knee Replacement was indicated. The procedure,  risks, and benefits of total knee arthroplasty were presented and reviewed. The risks including but not limited to aseptic loosening, infection, blood clots, vascular injury, stiffness, patella tracking problems complications among others were discussed. The patient acknowledged the explanation, agreed to proceed with the plan.  Donia Ast 12/08/2016, 6:30 AM

## 2016-12-08 NOTE — Anesthesia Procedure Notes (Signed)
Spinal  Patient location during procedure: OR Start time: 12/08/2016 11:20 AM End time: 12/08/2016 11:35 AM Staffing Anesthesiologist: Rica Koyanagi Performed: anesthesiologist  Preanesthetic Checklist Completed: patient identified, site marked, surgical consent, pre-op evaluation, timeout performed, IV checked, risks and benefits discussed and monitors and equipment checked Spinal Block Patient position: sitting Prep: ChloraPrep Patient monitoring: cardiac monitor, continuous pulse ox and blood pressure Approach: midline Location: L3-4 Needle Needle type: Pencan  Needle gauge: 24 G Needle length: 9 cm Needle insertion depth: 7 cm Assessment Sensory level: T8

## 2016-12-08 NOTE — Progress Notes (Signed)
Orthopedic Tech Progress Note Patient Details:  William [name] 1950-10-28 [id number]  CPM Right Knee CPM Right Knee: On Right Knee Flexion (Degrees): 90 Right Knee Extension (Degrees): 0 Additional Comments: right knee   [name] 12/08/2016, 11:22 PM

## 2016-12-09 ENCOUNTER — Encounter (HOSPITAL_COMMUNITY): Payer: Self-pay | Admitting: Orthopedic Surgery

## 2016-12-09 DIAGNOSIS — M1711 Unilateral primary osteoarthritis, right knee: Secondary | ICD-10-CM | POA: Diagnosis not present

## 2016-12-09 LAB — BASIC METABOLIC PANEL
Anion gap: 6 (ref 5–15)
BUN: 17 mg/dL (ref 6–20)
CALCIUM: 9.4 mg/dL (ref 8.9–10.3)
CO2: 25 mmol/L (ref 22–32)
CREATININE: 0.82 mg/dL (ref 0.61–1.24)
Chloride: 108 mmol/L (ref 101–111)
Glucose, Bld: 130 mg/dL — ABNORMAL HIGH (ref 65–99)
Potassium: 4 mmol/L (ref 3.5–5.1)
SODIUM: 139 mmol/L (ref 135–145)

## 2016-12-09 LAB — CBC
HEMATOCRIT: 39.5 % (ref 39.0–52.0)
Hemoglobin: 13.6 g/dL (ref 13.0–17.0)
MCH: 30.8 pg (ref 26.0–34.0)
MCHC: 34.4 g/dL (ref 30.0–36.0)
MCV: 89.4 fL (ref 78.0–100.0)
PLATELETS: 187 10*3/uL (ref 150–400)
RBC: 4.42 MIL/uL (ref 4.22–5.81)
RDW: 13.7 % (ref 11.5–15.5)
WBC: 16.2 10*3/uL — AB (ref 4.0–10.5)

## 2016-12-09 MED ORDER — OXYCODONE HCL 10 MG PO TABS: 10.0000 mg | ORAL_TABLET | ORAL | 0 refills | Status: DC | PRN

## 2016-12-09 MED ORDER — ASPIRIN 325 MG PO TBEC: 325.0000 mg | DELAYED_RELEASE_TABLET | Freq: Two times a day (BID) | ORAL | 0 refills | Status: DC

## 2016-12-09 MED ORDER — METHOCARBAMOL 500 MG PO TABS: 500.0000 mg | ORAL_TABLET | Freq: Four times a day (QID) | ORAL | 0 refills | Status: DC | PRN

## 2016-12-09 NOTE — Care Management Note (Signed)
Case Management Note  Patient Details  Name: William [name] MRN: [id number] Date of Birth: 12/20/1950  Subjective/Objective:  66 yr old gentleman s/p right total knee arthroplasty.                 Action/Plan: Case manager spoke with patient concerning discharge plan and DME . Patient was preoperatively setup with [facility], no changes. Patient says his girlfriend will assist him at discharge. DME has been delivered to his home.    Expected Discharge Date:  12/09/16               Expected Discharge Plan:  [place]  In-House Referral:     Discharge planning Services  CM Consult  Post Acute Care Choice:  NA, Home Health, Durable Medical Equipment Choice offered to:  Patient  DME Arranged:  3-N-1, CPM, Walker rolling DME Agency:  [employer]  HH Arranged:  PT [place]:  [facility] [employer]  Status of Service:  Completed, signed off  If discussed at [employer], dates discussed:    Additional Comments:  Ninfa Meeker, RN 12/09/2016, 2:47 PM

## 2016-12-09 NOTE — Evaluation (Signed)
Occupational Therapy Evaluation Patient Details Name: William [name] MRN: [id number] DOB: [birth year] Today's Date: 12/09/2016    History of Present Illness pt is a 66 y/o male with pmh of HTN, leep apnea and OA of bil knees, admitted for elective R TKA.   Clinical Impression   Patient evaluated by Occupational Therapy with no further acute OT needs identified. All education has been completed and the patient has no further questions. See below for any follow-up Occupational Therapy or equipment needs. OT to sign off. Thank you for referral.      Follow Up Recommendations  No OT follow up    Equipment Recommendations  None recommended by OT    Recommendations for Other Services       Precautions / Restrictions Precautions Precautions: Knee Precaution Comments: reviewed precautions/positioning with pt Restrictions Weight Bearing Restrictions: Yes RLE Weight Bearing: Weight bearing as tolerated      Mobility Bed Mobility Overal bed mobility: Modified Independent Bed Mobility: Supine to Sit           General bed mobility comments: increased time and effort  Transfers Overall transfer level: Needs assistance Equipment used: Rolling walker (2 wheeled) Transfers: Sit to/from Stand Sit to Stand: Supervision         General transfer comment: supervision for safety; cues for hand placement and technique    Balance Overall balance assessment: Needs assistance   Sitting balance-Leahy Scale: Good     Standing balance support: During functional activity;Bilateral upper extremity supported Standing balance-Leahy Scale: Fair                             ADL either performed or assessed with clinical judgement   ADL Overall ADL's : Independent                                       General ADL Comments: pt demonstrates shwoer transfer. pt can reach feet at this time with his hands. Pt plans to wear closed toe shoes and pt has no pets in  the home. pt recently ( 4 weeks ago) lost his 76 yo pet.   Pt educated on bathing and avoid washing directly on incision. Pt educated to use new wash cloth and towel each day. Pt educated to allow water to run across dressing and not to soak in a tub at this time.Pt strongly advised not to get into swimming pool until incision completely closed and to speak with MD regarding pool swimming. Pt states I wont for 21 days. Pt again advised to have MD state okay to submerge R LE into pool.    Vision Baseline Vision/History: Wears glasses Wears Glasses: At all times       Perception     Praxis      Pertinent Vitals/Pain Pain Assessment: Faces Faces Pain Scale: Hurts even more Pain Location: R knee Pain Descriptors / Indicators: Aching;Sore;Grimacing;Guarding Pain Intervention(s): Monitored during session;Premedicated before session;Repositioned     Hand Dominance Right   Extremity/Trunk Assessment Upper Extremity Assessment Upper Extremity Assessment: Overall WFL for tasks assessed   Lower Extremity Assessment Lower Extremity Assessment: Defer to PT evaluation   Cervical / Trunk Assessment Cervical / Trunk Assessment: Normal   Communication Communication Communication: No difficulties   Cognition Arousal/Alertness: Awake/alert Behavior During Therapy: WFL for tasks assessed/performed Overall Cognitive Status: Within Functional Limits for tasks assessed  General Comments       Exercises Exercises: Total Joint Total Joint Exercises Ankle Circles/Pumps: AROM;Both;20 reps Quad Sets: AROM;Right;10 reps Towel Squeeze: AROM;10 reps Short Arc Quad: AROM;Right;10 reps Heel Slides: AROM;Right;10 reps Hip ABduction/ADduction: AROM;Right;10 reps Straight Leg Raises: AROM;Right;10 reps Long Arc Quad: AROM;Right;10 reps Knee Flexion: AROM;Right;10 reps;Seated Goniometric ROM: >90 degrees flexion   Shoulder Instructions       Home Living Family/patient expects to be discharged to:: Private residence Living Arrangements: Spouse/significant other Available Help at Discharge: Family;Available 24 hours/day Type of Home: House Home Access: Stairs to enter [employer] of Steps: 1   Home Layout: Two level;Other (Comment) Alternate Level Stairs-Number of Steps: flight Alternate Level Stairs-Rails: Right;Left Bathroom Shower/Tub: Occupational psychologist: Standard     Home Equipment: Environmental consultant - 2 wheels;Bedside commode   Additional Comments: Pt plans to use outdoor shower by pool that is walk in shower instead of tub shower in the home. pt has a girlfiend to (A) upon d/c.       Prior Functioning/Environment Level of Independence: Independent                 OT Problem List:        OT Treatment/Interventions:      OT Goals(Current goals can be found in the care plan section) Acute Rehab OT Goals Patient Stated Goal: home independent  OT Frequency:     Barriers to D/C:            Co-evaluation              AM-PAC PT "6 Clicks" Daily Activity     Outcome Measure Help from another person eating meals?: None Help from another person taking care of personal grooming?: None Help from another person toileting, which includes using toliet, bedpan, or urinal?: None Help from another person bathing (including washing, rinsing, drying)?: None Help from another person to put on and taking off regular upper body clothing?: None Help from another person to put on and taking off regular lower body clothing?: None 6 Click Score: 24   End of Session Equipment Utilized During Treatment: Rolling walker CPM Right Knee CPM Right Knee: Off Nurse Communication: Mobility status;Precautions  Activity Tolerance: Patient tolerated treatment well Patient left: Other (comment) (sitting EOB )  OT Visit Diagnosis: Unsteadiness on feet (R26.81)                Time: 9233-0076 OT Time  Calculation (min): 14 min Charges:  OT General Charges $OT Visit: 1 Procedure OT Evaluation $OT Eval Moderate Complexity: 1 Procedure G-Codes: OT G-codes **NOT FOR INPATIENT CLASS** Functional Assessment Tool Used: Clinical judgement Functional Limitation: Self care Self Care Current Status (A2633): 0 percent impaired, limited or restricted Self Care Goal Status (H5456): 0 percent impaired, limited or restricted Self Care Discharge Status (Y5638): 0 percent impaired, limited or restricted    Jeri Modena   OTR/L Pager: [phone] Office: [phone] .   Parke Poisson B 12/09/2016, 3:21 PM

## 2016-12-09 NOTE — Discharge Summary (Signed)
SPORTS MEDICINE & JOINT REPLACEMENT   William Mulch, MD   William Shadow, PA-C [place], [place], [place]  [address]                             [phone]  PATIENT ID: William [name]        MRN:  [id number]          DOB/AGE: 1951/01/21 / 66 y.o.    DISCHARGE SUMMARY  ADMISSION DATE:    12/08/2016 DISCHARGE DATE:   12/09/2016   ADMISSION DIAGNOSIS: primary osteoarthritis right knee    DISCHARGE DIAGNOSIS:  primary osteoarthritis right knee    ADDITIONAL DIAGNOSIS: Active Problems:   S/P total knee replacement  Past Medical History:  Diagnosis Date  . Arthritis    OA- knees   . GERD (gastroesophageal reflux disease)   . Hypertension   . Sleep apnea    CPAP-in use q night, last study 5 yrs. ago    PROCEDURE: Procedure(s): TOTAL KNEE ARTHROPLASTY on 12/08/2016  CONSULTS:    HISTORY:  See H&P in chart  HOSPITAL COURSE:  William [name] is a 66 y.o. admitted on 12/08/2016 and found to have a diagnosis of primary osteoarthritis right knee.  After appropriate laboratory studies were obtained  they were taken to the operating room on 12/08/2016 and underwent Procedure(s): TOTAL KNEE ARTHROPLASTY.   They were given perioperative antibiotics:  Anti-infectives    Start     Dose/Rate Route Frequency Ordered Stop   12/08/16 1800  ceFAZolin (ANCEF) IVPB 1 g/50 mL premix     1 g 100 mL/hr over 30 Minutes Intravenous Every 6 hours 12/08/16 1608 12/09/16 0100   12/08/16 0930  ceFAZolin (ANCEF) IVPB 2g/100 mL premix     2 g 200 mL/hr over 30 Minutes Intravenous To ShortStay Surgical 12/05/16 1122 12/08/16 1120    .  Patient given tranexamic acid IV or topical and exparel intra-operatively.  Tolerated the procedure well.    POD# 1: Vital signs were stable.  Patient denied Chest pain, shortness of breath, or calf pain.  Patient was started on Lovenox 30 mg subcutaneously twice daily at 8am.  Consults to PT, OT, and care management were made.  The patient was weight  bearing as tolerated.  CPM was placed on the operative leg 0-90 degrees for 6-8 hours a day. When out of the CPM, patient was placed in the foam block to achieve full extension. Incentive spirometry was taught.  Dressing was changed.       POD #2, Continued  PT for ambulation and exercise program.  IV saline locked.  O2 discontinued.    The remainder of the hospital course was dedicated to ambulation and strengthening.   The patient was discharged on 1 Day Post-Op in  Good condition.  Blood products given:none  DIAGNOSTIC STUDIES: Recent vital signs: Patient Vitals for the past 24 hrs:  BP Temp Temp src Pulse Resp SpO2  12/09/16 0532 (!) 141/68 (!) 97.4 F (36.3 C) Oral 60 18 96 %  12/09/16 0053 (!) 142/63 (!) 97.4 F (36.3 C) Oral 72 18 97 %  12/08/16 2157 (!) 134/58 97.6 F (36.4 C) Oral 73 18 97 %  12/08/16 2015 - - - - - 95 %  12/08/16 2013 - - - - - (!) 87 %  12/08/16 1624 (!) 142/67 99.3 F (37.4 C) Oral 72 19 92 %  12/08/16 1554 (!) 144/72 97.7 F (  36.5 C) - 69 (!) 23 98 %  12/08/16 1529 136/61 97.9 F (36.6 C) - 64 15 94 %  12/08/16 1513 (!) 151/73 - - 71 18 94 %  12/08/16 1500 - - - 71 (!) 24 93 %  12/08/16 1458 (!) 142/76 - - 72 20 93 %  12/08/16 1442 (!) 146/74 - - 71 17 91 %  12/08/16 1430 (!) 147/79 - - 71 15 95 %  12/08/16 1415 136/83 - - 77 17 95 %  12/08/16 1400 (!) 141/76 - - 73 16 94 %  12/08/16 1330 120/70 - - 74 18 90 %  12/08/16 1325 - 97.8 F (36.6 C) - 74 - 90 %       Recent laboratory studies:  Recent Labs  12/09/16 0321  WBC 16.2*  HGB 13.6  HCT 39.5  PLT 187    Recent Labs  12/09/16 0321  NA 139  K 4.0  CL 108  CO2 25  BUN 17  CREATININE 0.82  GLUCOSE 130*  CALCIUM 9.4   No results found for: INR, PROTIME   Recent Radiographic Studies :  No results found.  DISCHARGE INSTRUCTIONS: Discharge Instructions    CPM    Complete by:  As directed    Continuous passive motion machine (CPM):      Use the CPM from 0 to 90 for 4-6  hours per day.      You may increase by 10 per day.  You may break it up into 2 or 3 sessions per day.      Use CPM for 2 weeks or until you are told to stop.   Call MD / Call 911    Complete by:  As directed    If you experience chest pain or shortness of breath, CALL 911 and be transported to the hospital emergency room.  If you develope a fever above 101 F, pus (white drainage) or increased drainage or redness at the wound, or calf pain, call your surgeon's office.   Constipation Prevention    Complete by:  As directed    Drink plenty of fluids.  Prune juice may be helpful.  You may use a stool softener, such as Colace (over the counter) 100 mg twice a day.  Use MiraLax (over the counter) for constipation as needed.   Diet - low sodium heart healthy    Complete by:  As directed    Discharge instructions    Complete by:  As directed    INSTRUCTIONS AFTER JOINT REPLACEMENT   Remove items at home which could result in a fall. This includes throw rugs or furniture in walking pathways ICE to the affected joint every three hours while awake for 30 minutes at a time, for at least the first 3-5 days, and then as needed for pain and swelling.  Continue to use ice for pain and swelling. You may notice swelling that will progress down to the foot and ankle.  This is normal after surgery.  Elevate your leg when you are not up walking on it.   Continue to use the breathing machine you got in the hospital (incentive spirometer) which will help keep your temperature down.  It is common for your temperature to cycle up and down following surgery, especially at night when you are not up moving around and exerting yourself.  The breathing machine keeps your lungs expanded and your temperature down.   DIET:  As you were doing prior to hospitalization, we  recommend a well-balanced diet.  DRESSING / WOUND CARE / SHOWERING  Keep the surgical dressing until follow up.  The dressing is water proof, so you can  shower without any extra covering.  IF THE DRESSING FALLS OFF or the wound gets wet inside, change the dressing with sterile gauze.  Please use good hand washing techniques before changing the dressing.  Do not use any lotions or creams on the incision until instructed by your surgeon.    ACTIVITY  Increase activity slowly as tolerated, but follow the weight bearing instructions below.   No driving for 6 weeks or until further direction given by your physician.  You cannot drive while taking narcotics.  No lifting or carrying greater than 10 lbs. until further directed by your surgeon. Avoid periods of inactivity such as sitting longer than an hour when not asleep. This helps prevent blood clots.  You may return to work once you are authorized by your doctor.     WEIGHT BEARING   Weight bearing as tolerated with assist device (walker, cane, etc) as directed, use it as long as suggested by your surgeon or therapist, typically at least 4-6 weeks.   EXERCISES  Results after joint replacement surgery are often greatly improved when you follow the exercise, range of motion and muscle strengthening exercises prescribed by your doctor. Safety measures are also important to protect the joint from further injury. Any time any of these exercises cause you to have increased pain or swelling, decrease what you are doing until you are comfortable again and then slowly increase them. If you have problems or questions, call your caregiver or physical therapist for advice.   Rehabilitation is important following a joint replacement. After just a few days of immobilization, the muscles of the leg can become weakened and shrink (atrophy).  These exercises are designed to build up the tone and strength of the thigh and leg muscles and to improve motion. Often times heat used for twenty to thirty minutes before working out will loosen up your tissues and help with improving the range of motion but do not use heat  for the first two weeks following surgery (sometimes heat can increase post-operative swelling).   These exercises can be done on a training (exercise) mat, on the floor, on a table or on a bed. Use whatever works the best and is most comfortable for you.    Use music or television while you are exercising so that the exercises are a pleasant break in your day. This will make your life better with the exercises acting as a break in your routine that you can look forward to.   Perform all exercises about fifteen times, three times per day or as directed.  You should exercise both the operative leg and the other leg as well.   Exercises include:   Quad Sets - Tighten up the muscle on the front of the thigh (Quad) and hold for 5-10 seconds.   Straight Leg Raises - With your knee straight (if you were given a brace, keep it on), lift the leg to 60 degrees, hold for 3 seconds, and slowly lower the leg.  Perform this exercise against resistance later as your leg gets stronger.  Leg Slides: Lying on your back, slowly slide your foot toward your buttocks, bending your knee up off the floor (only go as far as is comfortable). Then slowly slide your foot back down until your leg is flat on the floor again.  Angel Wings: Lying on your back spread your legs to the side as far apart as you can without causing discomfort.  Hamstring Strength:  Lying on your back, push your heel against the floor with your leg straight by tightening up the muscles of your buttocks.  Repeat, but this time bend your knee to a comfortable angle, and push your heel against the floor.  You may put a pillow under the heel to make it more comfortable if necessary.   A rehabilitation program following joint replacement surgery can speed recovery and prevent re-injury in the future due to weakened muscles. Contact your doctor or a physical therapist for more information on knee rehabilitation.    CONSTIPATION  Constipation is defined  medically as fewer than three stools per week and severe constipation as less than one stool per week.  Even if you have a regular bowel pattern at home, your normal regimen is likely to be disrupted due to multiple reasons following surgery.  Combination of anesthesia, postoperative narcotics, change in appetite and fluid intake all can affect your bowels.   YOU MUST use at least one of the following options; they are listed in order of increasing strength to get the job done.  They are all available over the counter, and you may need to use some, POSSIBLY even all of these options:    Drink plenty of fluids (prune juice may be helpful) and high fiber foods Colace 100 mg by mouth twice a day  Senokot for constipation as directed and as needed Dulcolax (bisacodyl), take with full glass of water  Miralax (polyethylene glycol) once or twice a day as needed.  If you have tried all these things and are unable to have a bowel movement in the first 3-4 days after surgery call either your surgeon or your primary doctor.    If you experience loose stools or diarrhea, hold the medications until you stool forms back up.  If your symptoms do not get better within 1 week or if they get worse, check with your doctor.  If you experience "the worst abdominal pain ever" or develop nausea or vomiting, please contact the office immediately for further recommendations for treatment.   ITCHING:  If you experience itching with your medications, try taking only a single pain pill, or even half a pain pill at a time.  You can also use Benadryl over the counter for itching or also to help with sleep.   TED HOSE STOCKINGS:  Use stockings on both legs until for at least 2 weeks or as directed by physician office. They may be removed at night for sleeping.  MEDICATIONS:  See your medication summary on the "After Visit Summary" that nursing will review with you.  You may have some home medications which will be placed on hold  until you complete the course of blood thinner medication.  It is important for you to complete the blood thinner medication as prescribed.  PRECAUTIONS:  If you experience chest pain or shortness of breath - call 911 immediately for transfer to the hospital emergency department.   If you develop a fever greater that 101 F, purulent drainage from wound, increased redness or drainage from wound, foul odor from the wound/dressing, or calf pain - CONTACT YOUR SURGEON.  FOLLOW-UP APPOINTMENTS:  If you do not already have a post-op appointment, please call the office for an appointment to be seen by your surgeon.  Guidelines for how soon to be seen are listed in your "After Visit Summary", but are typically between 1-4 weeks after surgery.  OTHER INSTRUCTIONS:   Knee Replacement:  Do not place pillow under knee, focus on keeping the knee straight while resting. CPM instructions: 0-90 degrees, 2 hours in the morning, 2 hours in the afternoon, and 2 hours in the evening. Place foam block, curve side up under heel at all times except when in CPM or when walking.  DO NOT modify, tear, cut, or change the foam block in any way.  MAKE SURE YOU:  Understand these instructions.  Get help right away if you are not doing well or get worse.    Thank you for letting us be a part of your medical care team.  It is a privilege we respect greatly.  We hope these instructions will help you stay on track for a fast and full recovery!   Increase activity slowly as tolerated    Complete by:  As directed       DISCHARGE MEDICATIONS:   Allergies as of 12/09/2016      Reactions   Sulfa Antibiotics    UNSPECIFIED REACTION       Medication List    STOP taking these medications   KENALOG IJ   meloxicam 15 MG tablet Commonly known as:  MOBIC     TAKE these medications   4-WAY FAST ACTING 1 % nasal spray Generic drug:  phenylephrine Place 1 drop into both  nostrils every 6 (six) hours as needed for congestion.   allopurinol 100 MG tablet Commonly known as:  ZYLOPRIM Take 100 mg by mouth daily.   amLODipine 5 MG tablet Commonly known as:  NORVASC Take 5 mg by mouth daily. for high blood pressure   aspirin 325 MG EC tablet Take 1 tablet (325 mg total) by mouth 2 (two) times daily. What changed:  medication strength  how much to take  when to take this   atorvastatin 10 MG tablet Commonly known as:  LIPITOR Take 10 mg by mouth daily.   carboxymethylcellulose 0.5 % Soln Commonly known as:  REFRESH PLUS Apply 1-2 drops to eye 3 (three) times daily as needed (for dry/irritated eyes.).   CoQ10 100 MG Caps Take 100 mg by mouth daily.   diphenhydramine-acetaminophen 25-500 MG Tabs tablet Commonly known as:  TYLENOL PM Take 1 tablet by mouth at bedtime as needed (for sleep.).   Fish Oil 1000 MG Caps Take 2,000 mg by mouth 2 (two) times daily.   Flaxseed Oil 1000 MG Caps Take 1,000 mg by mouth 2 (two) times daily.   GLUCOSAMINE PO Take 2 tablets by mouth 2 (two) times daily.   losartan 100 MG tablet Commonly known as:  COZAAR Take 100 mg by mouth daily.   Magnesium Glycinate Powd Take 400 mg by mouth 2 (two) times daily.   methocarbamol 500 MG tablet Commonly known as:  ROBAXIN Take 1-2 tablets (500-1,000 mg total) by mouth every 6 (six) hours as needed for muscle spasms.   metoprolol succinate 100 MG 24 hr tablet Commonly known as:  TOPROL-XL Take 100 mg by mouth daily.   multivitamin with minerals Tabs tablet Take 1 tablet by mouth daily.   Oxycodone HCl 10 MG Tabs Take 1 tablet (10 mg total) by mouth every 4 (four)  hours as needed.   pantoprazole 40 MG tablet Commonly known as:  PROTONIX Take 40 mg by mouth daily before breakfast.   tamsulosin 0.4 MG Caps capsule Commonly known as:  FLOMAX Take 0.4 mg by mouth 2 (two) times daily.   Turmeric Curcumin 500 MG Caps Take 500 mg by mouth 2 (two) times  daily.            Durable Medical Equipment        Start     Ordered   12/08/16 1608  DME Walker rolling  Once    Question:  Patient needs a walker to treat with the following condition  Answer:  S/P total knee replacement   12/08/16 1608   12/08/16 1608  DME 3 n 1  Once     12/08/16 1608   12/08/16 1608  DME Bedside commode  Once    Question:  Patient needs a bedside commode to treat with the following condition  Answer:  S/P total knee replacement   12/08/16 1608      FOLLOW UP VISIT:    DISPOSITION: HOME VS. SNF  CONDITION:  Good   Donia Ast 12/09/2016, 1:17 PM

## 2016-12-09 NOTE — Op Note (Signed)
TOTAL KNEE REPLACEMENT OPERATIVE NOTE:  12/08/2016  11:35 AM  PATIENT:  William [name]  66 y.o. male  PRE-OPERATIVE DIAGNOSIS:  primary osteoarthritis right knee  POST-OPERATIVE DIAGNOSIS:  primary osteoarthritis right knee  PROCEDURE:  Procedure(s): TOTAL KNEE ARTHROPLASTY  SURGEON:  Surgeon(s): Vickey Huger, MD  PHYSICIAN ASSISTANT: Nehemiah Massed, [facility]  ANESTHESIA:   spinal  DRAINS: Hemovac  SPECIMEN: None  COUNTS:  Correct  TOURNIQUET:   Total Tourniquet Time Documented: Thigh (Right) - 48 minutes Total: Thigh (Right) - 48 minutes   DICTATION:  Indication for procedure:    The patient is a 66 y.o. male who has failed conservative treatment for primary osteoarthritis right knee.  Informed consent was obtained prior to anesthesia. The risks versus benefits of the operation were explain and in a way the patient can, and did, understand.   On the implant demand matching protocol, this patient scored 10.  Therefore, this patient was not receive a polyethylene insert with vitamin E which is a high demand implant.  Description of procedure:     The patient was taken to the operating room and placed under anesthesia.  The patient was positioned in the usual fashion taking care that all body parts were adequately padded and/or protected.  I foley catheter was not placed.  A tourniquet was applied and the leg prepped and draped in the usual sterile fashion.  The extremity was exsanguinated with the esmarch and tourniquet inflated to 350 mmHg.  Pre-operative range of motion was normal.  The knee was in 5 degree of mild valgus.  A midline incision approximately 6-7 inches long was made with a #10 blade.  A new blade was used to make a parapatellar arthrotomy going 2-3 cm into the quadriceps tendon, over the patella, and alongside the medial aspect of the patellar tendon.  A synovectomy was then performed with the #10 blade and forceps. I then elevated the deep MCL off the medial  tibial metaphysis subperiosteally around to the semimembranosus attachment.    I everted the patella and used calipers to measure patellar thickness.  I used the reamer to ream down to appropriate thickness to recreate the native thickness.  I then removed excess bone with the rongeur and sagittal saw.  I used the appropriately sized template and drilled the three lug holes.  I then put the trial in place and measured the thickness with the calipers to ensure recreation of the native thickness.  The trial was then removed and the patella subluxed and the knee brought into flexion.  A homan retractor was place to retract and protect the patella and lateral structures.  A Z-retractor was place medially to protect the medial structures.  The extra-medullary alignment system was used to make cut the tibial articular surface perpendicular to the anamotic axis of the tibia and in 3 degrees of posterior slope.  The cut surface and alignment jig was removed.  I then used the intramedullary alignment guide to make a 5 valgus cut on the distal femur.  I then marked out the epicondylar axis on the distal femur.  The posterior condylar axis measured 3 degrees.  I then used the anterior referencing sizer and measured the femur to be a size 10.  The 4-In-1 cutting block was screwed into place in external rotation matching the posterior condylar angle, making our cuts perpendicular to the epicondylar axis.  Anterior, posterior and chamfer cuts were made with the sagittal saw.  The cutting block and cut pieces were  removed.  A lamina spreader was placed in 90 degrees of flexion.  The ACL, PCL, menisci, and posterior condylar osteophytes were removed.  A 11 mm spacer blocked was found to offer good flexion and extension gap balance after mild in degree releasing.   The scoop retractor was then placed and the femoral finishing block was pinned in place.  The small sagittal saw was used as well as the lug drill to finish the  femur.  The block and cut surfaces were removed and the medullary canal hole filled with autograft bone from the cut pieces.  The tibia was delivered forward in deep flexion and external rotation.  A size G tray was selected and pinned into place centered on the medial 1/3 of the tibial tubercle.  The reamer and keel was used to prepare the tibia through the tray.    I then trialed with the size 10 femur, size G tibia, a 11 mm insert and the 38 patella.  I had excellent flexion/extension gap balance, excellent patella tracking.  Flexion was full and beyond 120 degrees; extension was zero.  These components were chosen and the staff opened them to me on the back table while the knee was lavaged copiously and the cement mixed.  The soft tissue was infiltrated with 60cc of exparel 1.3% through a 21 gauge needle.  I cemented in the components and removed all excess cement.  The polyethylene tibial component was snapped into place and the knee placed in extension while cement was hardening.  The capsule was infilltrated with 30cc of .25% Marcaine with epinephrine.  A hemovac was place in the joint exiting superolaterally.  A pain pump was place superomedially superficial to the arthrotomy.  Once the cement was hard, the tourniquet was let down.  Hemostasis was obtained.  The arthrotomy was closed with figure-8 #1 vicryl sutures.  The deep soft tissues were closed with #0 vicryls and the subcuticular layer closed with a running #2-0 vicryl.  The skin was reapproximated and closed with skin staples.  The wound was dressed with xeroform, 4 x4's, 2 ABD sponges, a single layer of webril and a TED stocking.   The patient was then awakened, extubated, and taken to the recovery room in stable condition.  BLOOD LOSS:  300cc DRAINS: 1 hemovac, 1 pain catheter COMPLICATIONS:  None.  PLAN OF CARE: Admit to inpatient   PATIENT DISPOSITION:  PACU - hemodynamically stable.   Delay start of Pharmacological VTE agent  (>24hrs) due to surgical blood loss or risk of bleeding:  not applicable  Please fax a copy of this op note to my office at [phone] (please only include page 1 and 2 of the Case Information op note)

## 2016-12-09 NOTE — Progress Notes (Signed)
SPORTS MEDICINE AND JOINT REPLACEMENT  Lara Mulch, MD    Carlyon Shadow, PA-C [place], [place], [place]  [address]                             [phone]   PROGRESS NOTE  Subjective:  negative for Chest Pain  negative for Shortness of Breath  negative for Nausea/Vomiting   negative for Calf Pain  negative for Bowel Movement   Tolerating Diet: yes         Patient reports pain as 3 on 0-10 scale.    Objective: Vital signs in last 24 hours:   Patient Vitals for the past 24 hrs:  BP Temp Temp src Pulse Resp SpO2  12/09/16 0532 (!) 141/68 (!) 97.4 F (36.3 C) Oral 60 18 96 %  12/09/16 0053 (!) 142/63 (!) 97.4 F (36.3 C) Oral 72 18 97 %  12/08/16 2157 (!) 134/58 97.6 F (36.4 C) Oral 73 18 97 %  12/08/16 2015 - - - - - 95 %  12/08/16 2013 - - - - - (!) 87 %  12/08/16 1624 (!) 142/67 99.3 F (37.4 C) Oral 72 19 92 %  12/08/16 1554 (!) 144/72 97.7 F (36.5 C) - 69 (!) 23 98 %  12/08/16 1529 136/61 97.9 F (36.6 C) - 64 15 94 %  12/08/16 1513 (!) 151/73 - - 71 18 94 %  12/08/16 1500 - - - 71 (!) 24 93 %  12/08/16 1458 (!) 142/76 - - 72 20 93 %  12/08/16 1442 (!) 146/74 - - 71 17 91 %  12/08/16 1430 (!) 147/79 - - 71 15 95 %  12/08/16 1415 136/83 - - 77 17 95 %  12/08/16 1400 (!) 141/76 - - 73 16 94 %  12/08/16 1330 120/70 - - 74 18 90 %  12/08/16 1325 - 97.8 F (36.6 C) - 74 - 90 %  12/08/16 1100 (!) 138/54 - - (!) 59 17 95 %  12/08/16 1055 (!) 115/59 - - (!) 59 19 95 %  12/08/16 1050 134/70 - - 63 20 95 %  12/08/16 1045 140/86 - - 68 13 98 %  12/08/16 1040 140/67 - - 70 19 98 %  12/08/16 1035 (!) 146/79 - - 96 19 (!) 84 %  12/08/16 0810 136/65 97.8 F (36.6 C) Oral 72 20 96 %    @flow {1959:LAST@   Intake/Output from previous day:   07/23 0701 - 07/24 0700 In: 2160 [P.O.:910; I.V.:1100] Out: 3150 [Urine:3100]   Intake/Output this shift:   No intake/output data recorded.   Intake/Output      07/23 0701 - 07/24 0700 07/24 0701 - 07/25 0700   P.O. 910    I.V. 1100    Other 100    IV Piggyback 50    Total Intake 2160     Urine 3100    Blood 50    Total Output 3150     Net -990             LABORATORY DATA:  Recent Labs  12/09/16 0321  WBC 16.2*  HGB 13.6  HCT 39.5  PLT 187    Recent Labs  12/09/16 0321  NA 139  K 4.0  CL 108  CO2 25  BUN 17  CREATININE 0.82  GLUCOSE 130*  CALCIUM 9.4   No results found for: INR, PROTIME  Examination:  General appearance: alert, cooperative and  no distress Extremities: extremities normal, atraumatic, no cyanosis or edema  Wound Exam: clean, dry, intact   Drainage:  None: wound tissue dry  Motor Exam: Quadriceps and Hamstrings Intact  Sensory Exam: Superficial Peroneal normal   Assessment:    1 Day Post-Op  Procedure(s) (LRB): TOTAL KNEE ARTHROPLASTY (Right)  ADDITIONAL DIAGNOSIS:  Active Problems:   S/P total knee replacement    Plan: Physical Therapy as ordered Weight Bearing as Tolerated (WBAT)  DVT Prophylaxis:  Aspirin  DISCHARGE PLAN: Home  DISCHARGE NEEDS: HHPT   Patient doing very well, expected D/C home today         Donia Ast 12/09/2016, 7:14 AM

## 2016-12-09 NOTE — Progress Notes (Signed)
Physical Therapy Treatment Patient Details Name: William [name] MRN: [id number] DOB: 01-28-1951 Today's Date: 12/09/2016    History of Present Illness pt is a 66 y/o male with pmh of HTN, leep apnea and OA of bil knees, admitted for elective R TKA.    PT Comments    Patient continues to progress with mobility and is mod I/supervision for bed mobility and transfers. Pt required min A at times when ambulating due to R knee instability noted and pt with some posterior bias with sharp pains. Review HEP and practice step next session.    Follow Up Recommendations  DC plan and follow up therapy as arranged by surgeon     Equipment Recommendations  None recommended by PT    Recommendations for Other Services       Precautions / Restrictions Precautions Precautions: Knee Precaution Comments: reviewed precautions/positioning with pt Restrictions Weight Bearing Restrictions: Yes RLE Weight Bearing: Weight bearing as tolerated    Mobility  Bed Mobility Overal bed mobility: Modified Independent Bed Mobility: Supine to Sit           General bed mobility comments: increased time and effort  Transfers Overall transfer level: Needs assistance Equipment used: Rolling walker (2 wheeled) Transfers: Sit to/from Stand Sit to Stand: Supervision         General transfer comment: supervision for safety; cues for hand placement and technique  Ambulation/Gait Ambulation/Gait assistance: Min guard;Min assist Ambulation Distance (Feet): 160 Feet Assistive device: Rolling walker (2 wheeled) Gait Pattern/deviations: Step-to pattern;Step-through pattern;Antalgic;Decreased stance time - right;Decreased step length - left;Decreased step length - right     General Gait Details: cues for R quad activation during stance phase; pt unsteady at times with posterior bias and R knee instability requiring min A for balance   Stairs            Wheelchair Mobility    Modified Rankin  (Stroke Patients Only)       Balance Overall balance assessment: Needs assistance   Sitting balance-Leahy Scale: Good     Standing balance support: During functional activity;Bilateral upper extremity supported Standing balance-Leahy Scale: Fair                              Cognition Arousal/Alertness: Awake/alert Behavior During Therapy: WFL for tasks assessed/performed Overall Cognitive Status: Within Functional Limits for tasks assessed                                        Exercises      General Comments        Pertinent Vitals/Pain Pain Assessment: Faces Faces Pain Scale: Hurts even more Pain Location: R knee Pain Descriptors / Indicators: Aching;Sore;Grimacing;Guarding Pain Intervention(s): Monitored during session;Premedicated before session;Repositioned;Ice applied    Home Living                      Prior Function            PT Goals (current goals can now be found in the care plan section) Acute Rehab PT Goals Patient Stated Goal: home independent PT Goal Formulation: With patient Time For Goal Achievement: 12/15/16 Potential to Achieve Goals: Good Progress towards PT goals: Progressing toward goals    Frequency    7X/week      PT Plan Current plan remains appropriate  Co-evaluation              AM-PAC PT "6 Clicks" Daily Activity  Outcome Measure  Difficulty turning over in bed (including adjusting bedclothes, sheets and blankets)?: None Difficulty moving from lying on back to sitting on the side of the bed? : None Difficulty sitting down on and standing up from a chair with arms (e.g., wheelchair, bedside commode, etc,.)?: A Little Help needed moving to and from a bed to chair (including a wheelchair)?: A Little Help needed walking in hospital room?: A Little Help needed climbing 3-5 steps with a railing? : A Little 6 Click Score: 20    End of Session Equipment Utilized During Treatment:  Gait belt Activity Tolerance: Patient tolerated treatment well Patient left: in chair;with call bell/phone within reach Nurse Communication: Mobility status PT Visit Diagnosis: Other abnormalities of gait and mobility (R26.89);Pain Pain - Right/Left: Right Pain - part of body: Knee     Time: 0935-1006 PT Time Calculation (min) (ACUTE ONLY): 31 min  Charges:  $Gait Training: 8-22 mins $Therapeutic Activity: 8-22 mins                    G Codes:       Earney Navy, PTA Pager: [phone]     Darliss Cheney 12/09/2016, 10:19 AM

## 2016-12-09 NOTE — Progress Notes (Signed)
Physical Therapy Treatment Patient Details Name: William [name] MRN: [id number] DOB: 1950-06-30 Today's Date: 12/09/2016    History of Present Illness pt is a 66 y/o male with pmh of HTN, leep apnea and OA of bil knees, admitted for elective R TKA.    PT Comments    Patient continues to progress with mobility. Pt tolerated HEP well and no assistance needed. Pt able to safely negotiate steps this session. Current plan remains appropriate.    Follow Up Recommendations  DC plan and follow up therapy as arranged by surgeon     Equipment Recommendations  None recommended by PT    Recommendations for Other Services       Precautions / Restrictions Precautions Precautions: Knee Precaution Comments: reviewed precautions/positioning with pt Restrictions Weight Bearing Restrictions: Yes RLE Weight Bearing: Weight bearing as tolerated    Mobility  Bed Mobility Overal bed mobility: Modified Independent Bed Mobility: Supine to Sit           General bed mobility comments: increased time and effort  Transfers Overall transfer level: Needs assistance Equipment used: Rolling walker (2 wheeled) Transfers: Sit to/from Stand Sit to Stand: Supervision         General transfer comment: supervision for safety; cues for hand placement and technique  Ambulation/Gait Ambulation/Gait assistance: Min guard Ambulation Distance (Feet): 20 Feet Assistive device: Rolling walker (2 wheeled) Gait Pattern/deviations: Step-through pattern;Antalgic;Decreased stance time - right;Decreased step length - left;Decreased step length - right     General Gait Details: cues for posture and safe use of AD; no knee buckling noted   Stairs Stairs: Yes   Stair Management: No rails;Step to pattern;Forwards;Backwards;With walker Number of Stairs:  (1 step X3) General stair comments: cues for sequencing and safety  Wheelchair Mobility    Modified Rankin (Stroke Patients Only)       Balance  Overall balance assessment: Needs assistance   Sitting balance-Leahy Scale: Good     Standing balance support: During functional activity;Bilateral upper extremity supported Standing balance-Leahy Scale: Fair                              Cognition Arousal/Alertness: Awake/alert Behavior During Therapy: WFL for tasks assessed/performed Overall Cognitive Status: Within Functional Limits for tasks assessed                                        Exercises Total Joint Exercises Ankle Circles/Pumps: AROM;Both;20 reps Quad Sets: AROM;Right;10 reps Towel Squeeze: AROM;10 reps Short Arc Quad: AROM;Right;10 reps Heel Slides: AROM;Right;10 reps Hip ABduction/ADduction: AROM;Right;10 reps Straight Leg Raises: AROM;Right;10 reps Long Arc Quad: AROM;Right;10 reps Knee Flexion: AROM;Right;10 reps;Seated Goniometric ROM: >90 degrees flexion    General Comments        Pertinent Vitals/Pain Pain Assessment: Faces Faces Pain Scale: Hurts even more Pain Location: R knee Pain Descriptors / Indicators: Aching;Sore;Grimacing;Guarding Pain Intervention(s): Monitored during session;Premedicated before session;Repositioned    Home Living                      Prior Function            PT Goals (current goals can now be found in the care plan section) Acute Rehab PT Goals Patient Stated Goal: home independent PT Goal Formulation: With patient Time For Goal Achievement: 12/15/16 Potential to Achieve Goals: Good Progress towards  PT goals: Progressing toward goals    Frequency    7X/week      PT Plan Current plan remains appropriate    Co-evaluation              AM-PAC PT "6 Clicks" Daily Activity  Outcome Measure  Difficulty turning over in bed (including adjusting bedclothes, sheets and blankets)?: None Difficulty moving from lying on back to sitting on the side of the bed? : None Difficulty sitting down on and standing up from a  chair with arms (e.g., wheelchair, bedside commode, etc,.)?: A Little Help needed moving to and from a bed to chair (including a wheelchair)?: A Little Help needed walking in hospital room?: A Little Help needed climbing 3-5 steps with a railing? : A Little 6 Click Score: 20    End of Session Equipment Utilized During Treatment: Gait belt Activity Tolerance: Patient tolerated treatment well Patient left: with call bell/phone within reach;in bed Nurse Communication: Mobility status PT Visit Diagnosis: Other abnormalities of gait and mobility (R26.89);Pain Pain - Right/Left: Right Pain - part of body: Knee     Time: 5176-1607 PT Time Calculation (min) (ACUTE ONLY): 31 min  Charges:  $Gait Training: 8-22 mins $Therapeutic Exercise: 8-22 mins                    G Codes:       Earney Navy, PTA Pager: [phone]     Darliss Cheney 12/09/2016, 2:10 PM

## 2017-06-02 DIAGNOSIS — M5416 Radiculopathy, lumbar region: Secondary | ICD-10-CM | POA: Insufficient documentation

## 2017-08-19 DIAGNOSIS — M1812 Unilateral primary osteoarthritis of first carpometacarpal joint, left hand: Secondary | ICD-10-CM | POA: Insufficient documentation

## 2017-08-19 DIAGNOSIS — R52 Pain, unspecified: Secondary | ICD-10-CM | POA: Insufficient documentation

## 2018-02-11 ENCOUNTER — Other Ambulatory Visit: Payer: Self-pay | Admitting: Neurosurgery

## 2018-02-18 NOTE — Pre-Procedure Instructions (Signed)
William [name]  02/18/2018      [id number] - [place], [place] [address] [place] [address] Phone: [phone] Fax: [phone]    Your procedure is scheduled on February 24, 2018.  Report to [facility] Admitting at 700 AM.  Call this number if you have problems the morning of surgery:  [phone]   Remember:  Do not eat or drink after midnight.    Take these medicines the morning of surgery with A SIP OF WATER  Allopurinol (zylprim) Metoprolol succinate (Toprol XL) Amlodipine (norvasc) Loratadine (claritin) Pantoprazole (protonix) tamsulosin (flomax) Eyedrops-if needed   Follow your surgeon's instructions on when to hold/resume aspirin.  If no instructions were given call the office to determine how they would like to you take aspirin  7 days prior to surgery STOP taking any Meloxicam (mobic), Aleve, Naproxen, Ibuprofen, Motrin, Advil, Goody's, BC's, all herbal medications, fish oil, and all vitamins   Do not wear jewelry  Do not wear lotions, powders, colognes, or deodorant.  Men may shave face and neck.  Do not bring valuables to the hospital.  [facility] is not responsible for any belongings or valuables.  Contacts, dentures or bridgework may not be worn into surgery.  Leave your suitcase in the car.  After surgery it may be brought to your room.  For patients admitted to the hospital, discharge time will be determined by your treatment team.  Patients discharged the day of surgery will not be allowed to drive home.   [facility]- Preparing For Surgery  Before surgery, you can play an important role. Because skin is not sterile, your skin needs to be as free of germs as possible. You can reduce the number of germs on your skin by washing with CHG (chlorahexidine gluconate) Soap before surgery.  CHG is an antiseptic cleaner which kills germs and bonds with the skin to continue killing germs  even after washing.    Oral Hygiene is also important to reduce your risk of infection.  Remember - BRUSH YOUR TEETH THE MORNING OF SURGERY WITH YOUR REGULAR TOOTHPASTE  Please do not use if you have an allergy to CHG or antibacterial soaps. If your skin becomes reddened/irritated stop using the CHG.  Do not shave (including legs and underarms) for at least 48 hours prior to first CHG shower. It is OK to shave your face.  Please follow these instructions carefully.   1. Shower the NIGHT BEFORE SURGERY and the MORNING OF SURGERY with CHG.   2. If you chose to wash your hair, wash your hair first as usual with your normal shampoo.  3. After you shampoo, rinse your hair and body thoroughly to remove the shampoo.  4. Use CHG as you would any other liquid soap. You can apply CHG directly to the skin and wash gently with a scrungie or a clean washcloth.   5. Apply the CHG Soap to your body ONLY FROM THE NECK DOWN.  Do not use on open wounds or open sores. Avoid contact with your eyes, ears, mouth and genitals (private parts). Wash Face and genitals (private parts)  with your normal soap.  6. Wash thoroughly, paying special attention to the area where your surgery will be performed.  7. Thoroughly rinse your body with warm water from the neck down.  8. DO NOT shower/wash with your normal soap after using and rinsing off the CHG Soap.  9. Pat yourself dry with a CLEAN TOWEL.  10. Wear CLEAN PAJAMAS to bed the night before surgery, wear comfortable clothes the morning of surgery  11. Place CLEAN SHEETS on your bed the night of your first shower and DO NOT SLEEP WITH PETS.  Day of Surgery:  Do not apply any deodorants/lotions.  Please wear clean clothes to the hospital/surgery center.   Remember to brush your teeth WITH YOUR REGULAR TOOTHPASTE.   Please read over the following fact sheets that you were given.

## 2018-02-19 ENCOUNTER — Encounter (HOSPITAL_COMMUNITY)
Admission: RE | Admit: 2018-02-19 | Discharge: 2018-02-19 | Disposition: A | Discharge status: Home / Self Care | Payer: Federal, State, Local not specified - PPO | Source: Ambulatory Visit | Attending: Neurosurgery | Admitting: Neurosurgery

## 2018-02-19 ENCOUNTER — Other Ambulatory Visit: Payer: Self-pay

## 2018-02-19 ENCOUNTER — Encounter (HOSPITAL_COMMUNITY): Payer: Self-pay

## 2018-02-19 DIAGNOSIS — Z01818 Encounter for other preprocedural examination: Secondary | ICD-10-CM | POA: Diagnosis present

## 2018-02-19 LAB — CBC
HCT: 44.5 % (ref 39.0–52.0)
HEMOGLOBIN: 15.3 g/dL (ref 13.0–17.0)
MCH: 31.2 pg (ref 26.0–34.0)
MCHC: 34.4 g/dL (ref 30.0–36.0)
MCV: 90.8 fL (ref 78.0–100.0)
PLATELETS: 176 10*3/uL (ref 150–400)
RBC: 4.9 MIL/uL (ref 4.22–5.81)
RDW: 12.6 % (ref 11.5–15.5)
WBC: 7.9 10*3/uL (ref 4.0–10.5)

## 2018-02-19 LAB — BASIC METABOLIC PANEL
ANION GAP: 9 (ref 5–15)
BUN: 13 mg/dL (ref 8–23)
CALCIUM: 10.3 mg/dL (ref 8.9–10.3)
CO2: 25 mmol/L (ref 22–32)
CREATININE: 0.96 mg/dL (ref 0.61–1.24)
Chloride: 106 mmol/L (ref 98–111)
GFR calc non Af Amer: >60 mL/min (ref >60)
Glucose, Bld: 123 mg/dL — ABNORMAL HIGH (ref 70–99)
Potassium: 3.8 mmol/L (ref 3.5–5.1)
SODIUM: 140 mmol/L (ref 135–145)

## 2018-02-19 LAB — SURGICAL PCR SCREEN
MRSA, PCR: NEGATIVE
Staphylococcus aureus: NEGATIVE

## 2018-02-19 NOTE — Progress Notes (Addendum)
PCP: Bertram Millard, MD  Cardiologist:  Pt denies  EKG: 02/19/18 in EPIC  Stress test: pt denies past 5 years  ECHO: pt denies past 5+ years  Cardiac Cath: pt denies  Chest x-ray: pt denies past year, no recent respiratory infections/complications  Aspirin 81 mg-pt began holding 02/17/18

## 2018-02-24 ENCOUNTER — Ambulatory Visit (HOSPITAL_COMMUNITY): Payer: Federal, State, Local not specified - PPO

## 2018-02-24 ENCOUNTER — Ambulatory Visit (HOSPITAL_COMMUNITY)
Admission: RE | Admit: 2018-02-24 | Discharge: 2018-02-24 | Disposition: A | Discharge status: Home / Self Care | Payer: Federal, State, Local not specified - PPO | Source: Ambulatory Visit | Attending: Neurosurgery | Admitting: Neurosurgery

## 2018-02-24 ENCOUNTER — Ambulatory Visit (HOSPITAL_COMMUNITY): Payer: Federal, State, Local not specified - PPO | Admitting: Anesthesiology

## 2018-02-24 ENCOUNTER — Other Ambulatory Visit: Payer: Self-pay

## 2018-02-24 ENCOUNTER — Encounter (HOSPITAL_COMMUNITY)
Admission: RE | Disposition: A | Discharge status: Home / Self Care | Payer: Self-pay | Source: Ambulatory Visit | Attending: Neurosurgery

## 2018-02-24 ENCOUNTER — Encounter (HOSPITAL_COMMUNITY): Payer: Self-pay | Admitting: *Deleted

## 2018-02-24 DIAGNOSIS — Z87891 Personal history of nicotine dependence: Secondary | ICD-10-CM | POA: Insufficient documentation

## 2018-02-24 DIAGNOSIS — Z8551 Personal history of malignant neoplasm of bladder: Secondary | ICD-10-CM | POA: Diagnosis not present

## 2018-02-24 DIAGNOSIS — G4730 Sleep apnea, unspecified: Secondary | ICD-10-CM | POA: Diagnosis not present

## 2018-02-24 DIAGNOSIS — Z7982 Long term (current) use of aspirin: Secondary | ICD-10-CM | POA: Insufficient documentation

## 2018-02-24 DIAGNOSIS — Z882 Allergy status to sulfonamides status: Secondary | ICD-10-CM | POA: Insufficient documentation

## 2018-02-24 DIAGNOSIS — Z79899 Other long term (current) drug therapy: Secondary | ICD-10-CM | POA: Insufficient documentation

## 2018-02-24 DIAGNOSIS — Z419 Encounter for procedure for purposes other than remedying health state, unspecified: Secondary | ICD-10-CM

## 2018-02-24 DIAGNOSIS — K219 Gastro-esophageal reflux disease without esophagitis: Secondary | ICD-10-CM | POA: Diagnosis not present

## 2018-02-24 DIAGNOSIS — I10 Essential (primary) hypertension: Secondary | ICD-10-CM | POA: Insufficient documentation

## 2018-02-24 DIAGNOSIS — M48062 Spinal stenosis, lumbar region with neurogenic claudication: Secondary | ICD-10-CM | POA: Diagnosis present

## 2018-02-24 HISTORY — PX: LUMBAR LAMINECTOMY/DECOMPRESSION MICRODISCECTOMY: SHX5026

## 2018-02-24 SURGERY — LUMBAR LAMINECTOMY/DECOMPRESSION MICRODISCECTOMY 1 LEVEL
Anesthesia: General | Site: Spine Lumbar

## 2018-02-24 MED ORDER — ROCURONIUM BROMIDE 50 MG/5ML IV SOSY
PREFILLED_SYRINGE | INTRAVENOUS | Status: DC | PRN
  Administered 2018-02-24: 50 mg via INTRAVENOUS

## 2018-02-24 MED ORDER — DOCUSATE SODIUM 100 MG PO CAPS
100.0000 mg | ORAL_CAPSULE | Freq: Two times a day (BID) | ORAL | Status: DC
  Administered 2018-02-24: 100 mg via ORAL
  Filled 2018-02-24: qty 1

## 2018-02-24 MED ORDER — SODIUM CHLORIDE 0.9 % IV SOLN
INTRAVENOUS | Status: DC | PRN
  Administered 2018-02-24: 25 ug/min via INTRAVENOUS

## 2018-02-24 MED ORDER — HYDROCHLOROTHIAZIDE 12.5 MG PO CAPS
12.5000 mg | ORAL_CAPSULE | Freq: Every day | ORAL | Status: DC
  Administered 2018-02-24: 12.5 mg via ORAL
  Filled 2018-02-24: qty 1

## 2018-02-24 MED ORDER — LOSARTAN POTASSIUM 50 MG PO TABS
100.0000 mg | ORAL_TABLET | Freq: Every day | ORAL | Status: DC
  Administered 2018-02-24: 100 mg via ORAL

## 2018-02-24 MED ORDER — HYDROCODONE-ACETAMINOPHEN 7.5-325 MG PO TABS
1.0000 | ORAL_TABLET | Freq: Four times a day (QID) | ORAL | Status: DC
  Administered 2018-02-24: 1 via ORAL
  Filled 2018-02-24: qty 1

## 2018-02-24 MED ORDER — FENTANYL CITRATE (PF) 100 MCG/2ML IJ SOLN
INTRAMUSCULAR | Status: DC | PRN
  Administered 2018-02-24: 150 ug via INTRAVENOUS

## 2018-02-24 MED ORDER — VITAMIN C 500 MG PO TABS
500.0000 mg | ORAL_TABLET | Freq: Every day | ORAL | Status: DC
  Administered 2018-02-24: 500 mg via ORAL
  Filled 2018-02-24: qty 1

## 2018-02-24 MED ORDER — FENTANYL CITRATE (PF) 250 MCG/5ML IJ SOLN
INTRAMUSCULAR | Status: AC
  Filled 2018-02-24: qty 5

## 2018-02-24 MED ORDER — METOPROLOL SUCCINATE ER 100 MG PO TB24: 100.0000 mg | ORAL_TABLET | Freq: Every day | ORAL | Status: DC

## 2018-02-24 MED ORDER — ACETAMINOPHEN 160 MG/5ML PO SOLN: 1000.0000 mg | Freq: Once | ORAL | Status: DC | PRN

## 2018-02-24 MED ORDER — CHLORHEXIDINE GLUCONATE CLOTH 2 % EX PADS: 6.0000 | MEDICATED_PAD | Freq: Once | CUTANEOUS | Status: DC

## 2018-02-24 MED ORDER — TURMERIC CURCUMIN 500 MG PO CAPS: 500.0000 mg | ORAL_CAPSULE | Freq: Two times a day (BID) | ORAL | Status: DC

## 2018-02-24 MED ORDER — TAMSULOSIN HCL 0.4 MG PO CAPS: 0.4000 mg | ORAL_CAPSULE | Freq: Two times a day (BID) | ORAL | Status: DC

## 2018-02-24 MED ORDER — AMLODIPINE BESYLATE 5 MG PO TABS: 5.0000 mg | ORAL_TABLET | Freq: Every day | ORAL | Status: DC

## 2018-02-24 MED ORDER — LIDOCAINE HCL (CARDIAC) PF 100 MG/5ML IV SOSY
PREFILLED_SYRINGE | INTRAVENOUS | Status: DC | PRN
  Administered 2018-02-24: 60 mg via INTRAVENOUS

## 2018-02-24 MED ORDER — CEFAZOLIN SODIUM-DEXTROSE 2-4 GM/100ML-% IV SOLN
2.0000 g | INTRAVENOUS | Status: AC
  Administered 2018-02-24: 2 g via INTRAVENOUS

## 2018-02-24 MED ORDER — MIDAZOLAM HCL 5 MG/5ML IJ SOLN
INTRAMUSCULAR | Status: DC | PRN
  Administered 2018-02-24: 1 mg via INTRAVENOUS

## 2018-02-24 MED ORDER — 0.9 % SODIUM CHLORIDE (POUR BTL) OPTIME
TOPICAL | Status: DC | PRN
  Administered 2018-02-24: 1000 mL

## 2018-02-24 MED ORDER — SUGAMMADEX SODIUM 200 MG/2ML IV SOLN
INTRAVENOUS | Status: DC | PRN
  Administered 2018-02-24: 200 mg via INTRAVENOUS

## 2018-02-24 MED ORDER — PHENOL 1.4 % MT LIQD: 1.0000 | OROMUCOSAL | Status: DC | PRN

## 2018-02-24 MED ORDER — PANTOPRAZOLE SODIUM 40 MG PO TBEC: 40.0000 mg | DELAYED_RELEASE_TABLET | Freq: Every day | ORAL | Status: DC

## 2018-02-24 MED ORDER — LACTATED RINGERS IV SOLN
INTRAVENOUS | Status: DC | PRN
  Administered 2018-02-24 (×2): via INTRAVENOUS

## 2018-02-24 MED ORDER — SODIUM CHLORIDE 0.9 % IV SOLN: 250.0000 mL | INTRAVENOUS | Status: DC

## 2018-02-24 MED ORDER — ACETAMINOPHEN 500 MG PO TABS
1000.0000 mg | ORAL_TABLET | Freq: Four times a day (QID) | ORAL | Status: DC
  Administered 2018-02-24: 1000 mg via ORAL
  Filled 2018-02-24: qty 2

## 2018-02-24 MED ORDER — LORATADINE 10 MG PO TABS: 10.0000 mg | ORAL_TABLET | Freq: Every day | ORAL | Status: DC | PRN

## 2018-02-24 MED ORDER — SODIUM CHLORIDE 0.9% FLUSH
3.0000 mL | Freq: Two times a day (BID) | INTRAVENOUS | Status: DC
  Administered 2018-02-24: 3 mL via INTRAVENOUS

## 2018-02-24 MED ORDER — OXYCODONE HCL 5 MG PO TABS: 5.0000 mg | ORAL_TABLET | Freq: Once | ORAL | Status: DC | PRN

## 2018-02-24 MED ORDER — POLYVINYL ALCOHOL 1.4 % OP SOLN
2.0000 [drp] | Freq: Three times a day (TID) | OPHTHALMIC | Status: DC | PRN
  Filled 2018-02-24: qty 15

## 2018-02-24 MED ORDER — OXYCODONE HCL 5 MG PO TABS: 10.0000 mg | ORAL_TABLET | ORAL | Status: DC | PRN

## 2018-02-24 MED ORDER — PHENYLEPHRINE HCL 10 MG/ML IJ SOLN
INTRAMUSCULAR | Status: DC | PRN
  Administered 2018-02-24: 100 ug via INTRAVENOUS

## 2018-02-24 MED ORDER — HEMOSTATIC AGENTS (NO CHARGE) OPTIME
TOPICAL | Status: DC | PRN
  Administered 2018-02-24: 1 via TOPICAL

## 2018-02-24 MED ORDER — CYCLOBENZAPRINE HCL 10 MG PO TABS: 10.0000 mg | ORAL_TABLET | Freq: Three times a day (TID) | ORAL | 0 refills | Status: DC | PRN

## 2018-02-24 MED ORDER — CEFAZOLIN SODIUM-DEXTROSE 2-4 GM/100ML-% IV SOLN
INTRAVENOUS | Status: AC
  Filled 2018-02-24: qty 100

## 2018-02-24 MED ORDER — OXYCODONE HCL 5 MG/5ML PO SOLN: 5.0000 mg | Freq: Once | ORAL | Status: DC | PRN

## 2018-02-24 MED ORDER — MAGNESIUM OXIDE 400 (241.3 MG) MG PO TABS: 400.0000 mg | ORAL_TABLET | Freq: Two times a day (BID) | ORAL | Status: DC

## 2018-02-24 MED ORDER — PROPOFOL 10 MG/ML IV BOLUS
INTRAVENOUS | Status: DC | PRN
  Administered 2018-02-24: 110 mg via INTRAVENOUS
  Administered 2018-02-24: 30 mg via INTRAVENOUS

## 2018-02-24 MED ORDER — COQ10 100 MG PO CAPS: 100.0000 mg | ORAL_CAPSULE | Freq: Every day | ORAL | Status: DC

## 2018-02-24 MED ORDER — MORPHINE SULFATE (PF) 2 MG/ML IV SOLN: 2.0000 mg | INTRAVENOUS | Status: DC | PRN

## 2018-02-24 MED ORDER — MIDAZOLAM HCL 2 MG/2ML IJ SOLN
INTRAMUSCULAR | Status: AC
  Filled 2018-02-24: qty 2

## 2018-02-24 MED ORDER — DIAZEPAM 5 MG PO TABS: 5.0000 mg | ORAL_TABLET | Freq: Four times a day (QID) | ORAL | Status: DC | PRN

## 2018-02-24 MED ORDER — LIDOCAINE-EPINEPHRINE 0.5 %-1:200000 IJ SOLN
INTRAMUSCULAR | Status: DC | PRN
  Administered 2018-02-24: 10 mL

## 2018-02-24 MED ORDER — ONDANSETRON HCL 4 MG/2ML IJ SOLN: 4.0000 mg | Freq: Four times a day (QID) | INTRAMUSCULAR | Status: DC | PRN

## 2018-02-24 MED ORDER — THROMBIN 5000 UNITS EX SOLR
CUTANEOUS | Status: DC | PRN
  Administered 2018-02-24 (×2): 5000 [IU] via TOPICAL

## 2018-02-24 MED ORDER — OXYCODONE HCL 5 MG PO TABS: 5.0000 mg | ORAL_TABLET | ORAL | Status: DC | PRN

## 2018-02-24 MED ORDER — BISACODYL 5 MG PO TBEC: 5.0000 mg | DELAYED_RELEASE_TABLET | Freq: Every day | ORAL | Status: DC | PRN

## 2018-02-24 MED ORDER — FENTANYL CITRATE (PF) 100 MCG/2ML IJ SOLN
25.0000 ug | INTRAMUSCULAR | Status: DC | PRN
  Administered 2018-02-24 (×2): 50 ug via INTRAVENOUS

## 2018-02-24 MED ORDER — ASPIRIN EC 81 MG PO TBEC: 81.0000 mg | DELAYED_RELEASE_TABLET | Freq: Every day | ORAL | Status: DC

## 2018-02-24 MED ORDER — ACETAMINOPHEN 500 MG PO TABS: 1000.0000 mg | ORAL_TABLET | Freq: Once | ORAL | Status: DC | PRN

## 2018-02-24 MED ORDER — ACETAMINOPHEN 10 MG/ML IV SOLN: 1000.0000 mg | Freq: Once | INTRAVENOUS | Status: DC | PRN

## 2018-02-24 MED ORDER — FENTANYL CITRATE (PF) 100 MCG/2ML IJ SOLN
INTRAMUSCULAR | Status: AC
  Filled 2018-02-24: qty 2

## 2018-02-24 MED ORDER — DEXAMETHASONE SODIUM PHOSPHATE 10 MG/ML IJ SOLN
INTRAMUSCULAR | Status: DC | PRN
  Administered 2018-02-24: 10 mg via INTRAVENOUS

## 2018-02-24 MED ORDER — HYDROCODONE-ACETAMINOPHEN 7.5-325 MG PO TABS: 1.0000 | ORAL_TABLET | Freq: Four times a day (QID) | ORAL | 0 refills | Status: DC

## 2018-02-24 MED ORDER — GABAPENTIN 300 MG PO CAPS
300.0000 mg | ORAL_CAPSULE | Freq: Three times a day (TID) | ORAL | Status: DC
  Administered 2018-02-24: 300 mg via ORAL
  Filled 2018-02-24: qty 1

## 2018-02-24 MED ORDER — LOSARTAN POTASSIUM-HCTZ 100-12.5 MG PO TABS: 1.0000 | ORAL_TABLET | Freq: Every day | ORAL | Status: DC

## 2018-02-24 MED ORDER — ACETAMINOPHEN 325 MG PO TABS: 650.0000 mg | ORAL_TABLET | ORAL | Status: DC | PRN

## 2018-02-24 MED ORDER — SENNOSIDES-DOCUSATE SODIUM 8.6-50 MG PO TABS: 1.0000 | ORAL_TABLET | Freq: Every evening | ORAL | Status: DC | PRN

## 2018-02-24 MED ORDER — ALLOPURINOL 100 MG PO TABS: 100.0000 mg | ORAL_TABLET | Freq: Every day | ORAL | Status: DC

## 2018-02-24 MED ORDER — ACETAMINOPHEN 650 MG RE SUPP: 650.0000 mg | RECTAL | Status: DC | PRN

## 2018-02-24 MED ORDER — SODIUM CHLORIDE 0.9% FLUSH: 3.0000 mL | INTRAVENOUS | Status: DC | PRN

## 2018-02-24 MED ORDER — FLAXSEED OIL 1000 MG PO CAPS: 2000.0000 mg | ORAL_CAPSULE | Freq: Two times a day (BID) | ORAL | Status: DC

## 2018-02-24 MED ORDER — THROMBIN 5000 UNITS EX SOLR
CUTANEOUS | Status: AC
  Filled 2018-02-24: qty 10000

## 2018-02-24 MED ORDER — PROPOFOL 10 MG/ML IV BOLUS
INTRAVENOUS | Status: AC
  Filled 2018-02-24: qty 40

## 2018-02-24 MED ORDER — BUPIVACAINE HCL (PF) 0.5 % IJ SOLN
INTRAMUSCULAR | Status: AC
  Filled 2018-02-24: qty 30

## 2018-02-24 MED ORDER — LIDOCAINE-EPINEPHRINE 0.5 %-1:200000 IJ SOLN
INTRAMUSCULAR | Status: AC
  Filled 2018-02-24: qty 1

## 2018-02-24 MED ORDER — MENTHOL 3 MG MT LOZG: 1.0000 | LOZENGE | OROMUCOSAL | Status: DC | PRN

## 2018-02-24 MED ORDER — BUPIVACAINE HCL (PF) 0.5 % IJ SOLN
INTRAMUSCULAR | Status: DC | PRN
  Administered 2018-02-24: 30 mL

## 2018-02-24 MED ORDER — ATORVASTATIN CALCIUM 20 MG PO TABS: 10.0000 mg | ORAL_TABLET | Freq: Every day | ORAL | Status: DC

## 2018-02-24 MED ORDER — ONDANSETRON HCL 4 MG PO TABS: 4.0000 mg | ORAL_TABLET | Freq: Four times a day (QID) | ORAL | Status: DC | PRN

## 2018-02-24 MED ORDER — ZOLPIDEM TARTRATE 5 MG PO TABS: 5.0000 mg | ORAL_TABLET | Freq: Every evening | ORAL | Status: DC | PRN

## 2018-02-24 MED ORDER — MAGNESIUM CITRATE PO SOLN: 1.0000 | Freq: Once | ORAL | Status: DC | PRN

## 2018-02-24 MED ORDER — POTASSIUM CHLORIDE IN NACL 20-0.9 MEQ/L-% IV SOLN: INTRAVENOUS | Status: DC

## 2018-02-24 MED ORDER — CYCLOBENZAPRINE HCL 10 MG PO TABS: 10.0000 mg | ORAL_TABLET | Freq: Three times a day (TID) | ORAL | Status: DC | PRN

## 2018-02-24 SURGICAL SUPPLY — 53 items
BENZOIN TINCTURE PRP APPL 2/3 (GAUZE/BANDAGES/DRESSINGS) IMPLANT
BLADE CLIPPER SURG (BLADE) ×3 IMPLANT
BUR MATCHSTICK NEURO 3.0 LAGG (BURR) ×3 IMPLANT
BUR PRECISION FLUTE 5.0 (BURR) ×3 IMPLANT
CANISTER SUCT 3000ML PPV (MISCELLANEOUS) ×3 IMPLANT
CARTRIDGE OIL MAESTRO DRILL (MISCELLANEOUS) ×1 IMPLANT
CLOSURE WOUND 1/2 X4 (GAUZE/BANDAGES/DRESSINGS)
COVER WAND RF STERILE (DRAPES) ×3 IMPLANT
DECANTER SPIKE VIAL GLASS SM (MISCELLANEOUS) ×6 IMPLANT
DERMABOND ADVANCED (GAUZE/BANDAGES/DRESSINGS) ×2
DERMABOND ADVANCED .7 DNX12 (GAUZE/BANDAGES/DRESSINGS) ×1 IMPLANT
DIFFUSER DRILL AIR PNEUMATIC (MISCELLANEOUS) ×3 IMPLANT
DRAPE LAPAROTOMY 100X72X124 (DRAPES) ×3 IMPLANT
DRAPE MICROSCOPE LEICA (MISCELLANEOUS) IMPLANT
DRAPE SURG 17X23 STRL (DRAPES) ×3 IMPLANT
DURAPREP 26ML APPLICATOR (WOUND CARE) ×3 IMPLANT
ELECT REM PT RETURN 9FT ADLT (ELECTROSURGICAL) ×3
ELECTRODE REM PT RTRN 9FT ADLT (ELECTROSURGICAL) ×1 IMPLANT
GAUZE 4X4 16PLY RFD (DISPOSABLE) IMPLANT
GAUZE SPONGE 4X4 12PLY STRL (GAUZE/BANDAGES/DRESSINGS) IMPLANT
GLOVE BIO SURGEON STRL SZ8 (GLOVE) ×3 IMPLANT
GLOVE BIOGEL PI IND STRL 6.5 (GLOVE) ×2 IMPLANT
GLOVE BIOGEL PI INDICATOR 6.5 (GLOVE) ×4
GLOVE ECLIPSE 6.5 STRL STRAW (GLOVE) ×3 IMPLANT
GLOVE EXAM NITRILE LRG STRL (GLOVE) IMPLANT
GLOVE EXAM NITRILE XL STR (GLOVE) IMPLANT
GLOVE EXAM NITRILE XS STR PU (GLOVE) IMPLANT
GLOVE SS BIOGEL STRL SZ 6.5 (GLOVE) ×3 IMPLANT
GLOVE SUPERSENSE BIOGEL SZ 6.5 (GLOVE) ×6
GOWN STRL REUS W/ TWL LRG LVL3 (GOWN DISPOSABLE) ×3 IMPLANT
GOWN STRL REUS W/ TWL XL LVL3 (GOWN DISPOSABLE) ×1 IMPLANT
GOWN STRL REUS W/TWL 2XL LVL3 (GOWN DISPOSABLE) IMPLANT
GOWN STRL REUS W/TWL LRG LVL3 (GOWN DISPOSABLE) ×6
GOWN STRL REUS W/TWL XL LVL3 (GOWN DISPOSABLE) ×2
KIT BASIN OR (CUSTOM PROCEDURE TRAY) ×3 IMPLANT
KIT TURNOVER KIT B (KITS) ×3 IMPLANT
NEEDLE HYPO 25X1 1.5 SAFETY (NEEDLE) ×3 IMPLANT
NEEDLE SPNL 18GX3.5 QUINCKE PK (NEEDLE) IMPLANT
NS IRRIG 1000ML POUR BTL (IV SOLUTION) ×3 IMPLANT
OIL CARTRIDGE MAESTRO DRILL (MISCELLANEOUS) ×3
PACK LAMINECTOMY NEURO (CUSTOM PROCEDURE TRAY) ×3 IMPLANT
PAD ARMBOARD 7.5X6 YLW CONV (MISCELLANEOUS) ×15 IMPLANT
RUBBERBAND STERILE (MISCELLANEOUS) IMPLANT
SPONGE LAP 4X18 RFD (DISPOSABLE) IMPLANT
SPONGE SURGIFOAM ABS GEL SZ50 (HEMOSTASIS) ×3 IMPLANT
STRIP CLOSURE SKIN 1/2X4 (GAUZE/BANDAGES/DRESSINGS) IMPLANT
SUT VIC AB 0 CT1 18XCR BRD8 (SUTURE) ×1 IMPLANT
SUT VIC AB 0 CT1 8-18 (SUTURE) ×2
SUT VIC AB 2-0 CT1 18 (SUTURE) ×3 IMPLANT
SUT VIC AB 3-0 SH 8-18 (SUTURE) ×6 IMPLANT
TOWEL GREEN STERILE (TOWEL DISPOSABLE) ×3 IMPLANT
TOWEL GREEN STERILE FF (TOWEL DISPOSABLE) ×3 IMPLANT
WATER STERILE IRR 1000ML POUR (IV SOLUTION) ×3 IMPLANT

## 2018-02-24 NOTE — Progress Notes (Signed)
Patient alert and oriented, mae's well, voiding adequate amount of urine, swallowing without difficulty, no c/o pain at time of discharge. Patient discharged home with family. Script and discharged instructions given to patient. Patient and family stated understanding of instructions given. Patient has an appointment with Dr. Cabbell   

## 2018-02-24 NOTE — Transfer of Care (Signed)
Immediate Anesthesia Transfer of Care Note  Patient: William [name]  Procedure(s) Performed: Lumbar Four-Five Laminectomy/Foraminotomy (N/A Spine Lumbar)  Patient Location: PACU  Anesthesia Type:General  Level of Consciousness: awake and alert   Airway & Oxygen Therapy: Patient Spontanous Breathing and Patient connected to nasal cannula oxygen  Post-op Assessment: Report given to RN and Post -op Vital signs reviewed and stable  Post vital signs: Reviewed and stable  Last Vitals:  Vitals Value Taken Time  BP 145/83 02/24/2018 11:41 AM  Temp    Pulse 77 02/24/2018 11:44 AM  Resp 20 02/24/2018 11:44 AM  SpO2 96 % 02/24/2018 11:44 AM  Vitals shown include unvalidated device data.  Last Pain:  Vitals:   02/24/18 0715  TempSrc: Oral         Complications: No apparent anesthesia complications

## 2018-02-24 NOTE — H&P (Signed)
BP (!) 168/84   Pulse 72   Temp 97.8 F (36.6 C) (Oral)   Resp 20   Ht 5\' 11"  (1.803 m)   Wt 97.5 kg   SpO2 98%   BMI 29.98 kg/m  William [name] comes in today for evaluation of pain that he has in his back and both lower extremities, right side worse than left. He had a knee replacement done in July of 2018. He says 3-4 months afterwards he started having pain and numbness in the right leg. He is not having much of any discomfort in the buttocks. But then when he speaks more, he says "I do have pain in both legs for example when I first get up in the morning or if I stand or walk for any period of time. I find walking very difficult to do." He says that his balance has been off to some degree but that he finds himself using a shopping cart far more frequently now than he used to in the past. He is 5 feet 10 inches in height, weighs 223 pounds, blood pressure is 168/82, pulse is 70, temperature is 97.9. He also has very specific pain in the back of both legs. He is currently retired. He is 67 years old and is right-handed. He drinks alcohol socially. He was a smoker. No history of substance abuse other than tobacco.   PAST MEDICAL HISTORY: Significant for hypertension, bladder cancer. He had a knee replacement in July of 2018, and had a bladder tumor removed.   ALLERGIES: No known drug allergies.   MEDICATIONS: Losartan, Metoprolol, Amlodipine, Allopurinol, Pantoprazole, Atorvastatin, Meloxicam, Magnesium Glycinate, Multivitamin, Glucosamine, Flaxseed Oil, Fish Oil, CoQ10, Tamsulosin, Turmeric.   FAMILY HISTORY: Mother and father both deceased.    He says the pain started sometime around December of 2018. He says nothing relieves the pain. Feels weakness in the legs, numbness and tingling in the legs.   REVIEW OF SYSTEMS: Positive for sinus problems, leg pain with walking, difficulty with urinary stream, arthritis, leg weakness, back pain, leg pain, joint pain, problems with coordination.    EXAM: He is alert, oriented x4, in obvious distress. He has an antalgic gait, favors the right lower extremity. He can toe walk, heel walk and do a squat. Reflexes are 2+ at the knees, 2+ at the ankles, 2+ at the biceps, triceps, and brachioradialis. Proprioception is intact. Romberg is negative. Pupils equal, round, and reactive to light. Full extraocular movements. Full visual fields. Hearing intact to voice. Uvula elevates midline. Shoulder shrug is normal. Tongue protrudes in midline. Symmetric facial sensation.   IMAGING: MRI lumbar spine from 2010, and 2019, in March, were reviewed. He has had progression of the listhesis at L4-5 and significant progression of what was already fairly bad stenosis in 2010, to now quite severe stenosis in 2019, at L4-5. He has some stenosis also present at L3-4, though not bad as 4-5. The conus is normal. Cauda equina outside of the stenosis is normal. Paraspinous soft tissues are normal.   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    William [name] comes in today for evaluation of the continued pain he has secondary to lumbar stenosis.  He has undergone a series of 3 injections and has had no improvement.  At this time, he would like to proceed with operative decompression of the spinal canal  at L4-5.  While he has some stenosis present at 3-4 and 4-5, it is significantly worse and I think that is the only level that needs to be addressed.  I would rather not weaken other areas of the spine if we do not need to at this time, and I do not think we do.  He is alert and oriented x4.  He answers all questions appropriately.  Memory, language, attention span, and fund of knowledge are normal.  Speech is clear, it is also fluent. 5/5 strength in the upper and lower extremities.  Gait is slow, but steady.  Memory, language, attention span, and fund of  knowledge are normal.  We will go ahead and get him scheduled for an L4-5 lumbar laminectomy to decompress the spinal canal at L4-5.  Risks and benefits of bleeding, infection, no relief, need for further surgery, fusion failure, and hardware failure were discussed.  He understands and wishes to proceed.

## 2018-02-24 NOTE — Progress Notes (Signed)
Occupational Therapy Evaluation Patient Details Name: William [name] MRN: [id number] DOB: January 25, 1951 Today's Date: 02/24/2018    History of Present Illness Pt is 67yo male with lumbar stenosis and neurogenic claudication s/p L4-5 semihemilaminectomy and foraminotomy. PMHx: HTN, bladder cancer s/p tumor removal, and L TKA 10/2016.    Clinical Impression   Completed all education regarding back precautions and ADL and functional mobility for ADL using compensatory techniques and AE/DME. Pt/wife demonstrated understanding through completion of ADL. No further OT needs.     Follow Up Recommendations  No OT follow up;Supervision - Intermittent    Equipment Recommendations  None recommended by OT    Recommendations for Other Services       Precautions / Restrictions Precautions Precautions: Back Precaution Booklet Issued: Yes (comment) Precaution Comments: Precautions reviewed with pt able to recall all at end of session.  Restrictions Weight Bearing Restrictions: No      Mobility Bed Mobility Overal bed mobility: Modified Independent   Transfers Overall transfer level: Needs assistance   Transfers: Sit to/from Stand Sit to Stand: Supervision         General transfer comment: unsteady from surgery    Balance Overall balance assessment: Needs assistance Sitting-balance support: Single extremity supported;Feet supported Sitting balance-Leahy Scale: Good       Standing balance-Leahy Scale: Fair                            ADL either performed or assessed with clinical judgement   ADL Overall ADL's : Needs assistance/impaired                                     Functional mobility during ADLs: Supervision/safety General ADL Comments: completed education regarding compensatory techniques and use of AE to increase independence with ADL while adhering to back precautions. Pt able to return demonstrate. Wfie present for education.  Education on home safety adn IADL tasks while following back precautions.      Vision         Perception     Praxis      Pertinent Vitals/Pain Pain Assessment: 0-10 Pain Score: 3  Pain Location: back Pain Descriptors / Indicators: Operative site guarding;Guarding;Discomfort Pain Intervention(s): Limited activity within patient's tolerance     Hand Dominance Right   Extremity/Trunk Assessment Upper Extremity Assessment Upper Extremity Assessment: Defer to OT evaluation   Lower Extremity Assessment Lower Extremity Assessment: RLE deficits/detail;LLE deficits/detail;Generalized weakness RLE Deficits / Details: TKA 10/2016 with increased pain with mobility.  LLE Deficits / Details:   Cervical / Trunk Assessment Cervical / Trunk Assessment: Normal   Communication Communication Communication: No difficulties   Cognition Arousal/Alertness: Awake/alert Behavior During Therapy: WFL for tasks assessed/performed Overall Cognitive Status: Within Functional Limits for tasks assessed                                     General Comments       Exercises     Shoulder Instructions      Home Living Family/patient expects to be discharged to:: Private residence Living Arrangements: Spouse/significant other   Type of Home: House Home Access: Stairs to enter [employer] of Steps: 1 Entrance Stairs-Rails: None Home Layout: Two level;Able to live on main level with bedroom/bathroom(office and computer in basement. ) Alternate Level  Stairs-Number of Steps: flight Alternate Level Stairs-Rails: Right;Left Bathroom Shower/Tub: Teacher, early years/pre: Standard Bathroom Accessibility: Yes How Accessible: Accessible via walker Home Equipment: [place] - 2 wheels;Bedside commode          Prior Functioning/Environment Level of Independence: Independent        Comments: still driving, enjoys spending time in pool, had trouble dressing prior to  sx         OT Problem List: Decreased knowledge of use of DME or AE;Decreased knowledge of precautions;Pain      OT Treatment/Interventions:      OT Goals(Current goals can be found in the care plan section) Acute Rehab OT Goals Patient Stated Goal: return home, "hang out at pool"  OT Goal Formulation: All assessment and education complete, DC therapy  OT Frequency:     Barriers to D/C:            Co-evaluation              AM-PAC PT "6 Clicks" Daily Activity     Outcome Measure Help from another person eating meals?: None Help from another person taking care of personal grooming?: None Help from another person toileting, which includes using toliet, bedpan, or urinal?: A Little Help from another person bathing (including washing, rinsing, drying)?: A Little Help from another person to put on and taking off regular upper body clothing?: None Help from another person to put on and taking off regular lower body clothing?: A Little 6 Click Score: 21   End of Session Nurse Communication: Mobility status  Activity Tolerance: Patient tolerated treatment well Patient left: in bed  OT Visit Diagnosis: Pain Pain - part of body: (back)                Time: 7062-3762 OT Time Calculation (min): 24 min Charges:  OT General Charges $OT Visit: 1 Visit OT Evaluation $OT Eval Low Complexity: 1 Low OT Treatments $Self Care/Home Management : 8-22 mins  Maurie Boettcher, OT/L   Acute OT Clinical Specialist Acute Rehabilitation Services Pager [phone] Office [phone]   [facility] 02/24/2018, 5:07 PM

## 2018-02-24 NOTE — Evaluation (Signed)
Physical Therapy Evaluation Patient Details Name: William [name] MRN: [id number] DOB: 17-Mar-1951 Today's Date: 02/24/2018   History of Present Illness  Pt is 67yo male with lumbar stenosis and neurogenic claudication s/p L4-5 semihemilaminectomy and foraminotomy. PMHx: HTN, bladder cancer s/p tumor removal, and L TKA 10/2016.   Clinical Impression  Pt pleasant on arrival, reporting he was feeling ok and ready to eat. Pt min guard for all mobility with bed, transfers, and ambulation with RW. Pt with antalgic gait, reporting that his knees were giving him trouble and increased pain with movement. Pt knees would slightly buckle without bil UE support. Pt encouraged to use RW at home for mobility. All back precautions were reviewed with pt and wife with handout provided. Discussed walking exercise program and progress with energy conservation techniques as well as techniques for car transfers at discharge. Pt with decreased strength, coordination, proper use of DME (see PT problem list for all). Pt will benefit acutely for above deficits to maximize functional mobility, independence and safety with decreasing fall risk.       Follow Up Recommendations No PT follow up    Equipment Recommendations  None recommended by PT    Recommendations for Other Services       Precautions / Restrictions Precautions Precautions: Back Precaution Booklet Issued: Yes (comment) Precaution Comments: Precautions reviewed with pt able to recall all at end of session.  Restrictions Weight Bearing Restrictions: No      Mobility  Bed Mobility Overal bed mobility: Needs Assistance Bed Mobility: Rolling;Sidelying to Sit;Sit to Sidelying Rolling: Min guard Sidelying to sit: Min guard     Sit to sidelying: Min assist General bed mobility comments: mostly min guard with cues for log roll technique and sequencing of arms, trunk and legs. sit to sidelying, assist for bil LEs up onto bed.   Transfers Overall  transfer level: Needs assistance   Transfers: Sit to/from Stand Sit to Stand: Min guard         General transfer comment: min guard for balance, vcs for hand placement on bed to push off and to prevent bending at trunk on rise.   Ambulation/Gait Ambulation/Gait assistance: Min guard Gait Distance (Feet): 100 Feet Assistive device: Rolling walker (2 wheeled);None Gait Pattern/deviations: Step-to pattern;Step-through pattern;Antalgic Gait velocity: decreased  Gait velocity interpretation: <1.8 ft/sec, indicate of risk for recurrent falls General Gait Details: first 23ft without AD, pt swaying with pt report of knee pain on RLE. RW remaining 75 feet with cues for posture and positioning in walker. RLE knee partially buckling when pt removed UE support from RW. Pt encouraged to use RW at home for now for increased safety. Pt RLE step to with cues to step through, pt able to correct ~50% of time. Pt foot low clearance from floor with occasional drag to.   Stairs Stairs: (pt still under some effects of the anesthesia with unsteadiness during ambulation. deferred at this time.  did discuss sequencing "up with good, down with bad knee" with pt and wife. )          Wheelchair Mobility    Modified Rankin (Stroke Patients Only)       Balance Overall balance assessment: Needs assistance Sitting-balance support: Single extremity supported;Feet supported Sitting balance-Leahy Scale: Fair       Standing balance-Leahy Scale: Fair Standing balance comment: can static stand without bil UE support but benefits from RW from ambulation.  Pertinent Vitals/Pain Pain Assessment: 0-10 Pain Score: 5  Pain Descriptors / Indicators: Operative site guarding;Guarding;Discomfort Pain Intervention(s): Limited activity within patient's tolerance;Monitored during session;Repositioned    Home Living Family/patient expects to be discharged to:: Private  residence Living Arrangements: Spouse/significant other   Type of Home: House Home Access: Stairs to enter Entrance Stairs-Rails: None Entrance Stairs-Number of Steps: 1 Home Layout: Two level;Able to live on main level with bedroom/bathroom(office and computer in basement. ) Home Equipment: [place] - 2 wheels;Bedside commode      Prior Function Level of Independence: Independent         Comments: still driving, enjoys spending time in pool, had trouble dressing prior to sx      Hand Dominance   Dominant Hand: Right    Extremity/Trunk Assessment   Upper Extremity Assessment Upper Extremity Assessment: Defer to OT evaluation    Lower Extremity Assessment Lower Extremity Assessment: RLE deficits/detail;LLE deficits/detail;Generalized weakness RLE Deficits / Details: TKA 10/2016 with increased pain with mobility.  LLE Deficits / Details: knee showing some buckling with ambulation without bil UE support.     Cervical / Trunk Assessment Cervical / Trunk Assessment: Normal  Communication   Communication: No difficulties  Cognition Arousal/Alertness: Awake/alert Behavior During Therapy: WFL for tasks assessed/performed Overall Cognitive Status: Within Functional Limits for tasks assessed                                        General Comments      Exercises     Assessment/Plan    PT Assessment Patient needs continued PT services  PT Problem List Decreased strength;Decreased mobility;Decreased safety awareness;Decreased range of motion;Decreased coordination;Decreased knowledge of precautions;Decreased balance;Decreased knowledge of use of DME;Decreased activity tolerance       PT Treatment Interventions DME instruction;Therapeutic activities;Gait training;Therapeutic exercise;Patient/family education;Stair training;Balance training;Functional mobility training    PT Goals (Current goals can be found in the Care Plan section)  Acute Rehab PT  Goals Patient Stated Goal: return home, "hang out at pool"  PT Goal Formulation: With patient/family Time For Goal Achievement: 03/10/18 Potential to Achieve Goals: Good    Frequency Min 5X/week   Barriers to discharge        Co-evaluation               AM-PAC PT "6 Clicks" Daily Activity  Outcome Measure Difficulty turning over in bed (including adjusting bedclothes, sheets and blankets)?: A Lot Difficulty moving from lying on back to sitting on the side of the bed? : Unable Difficulty sitting down on and standing up from a chair with arms (e.g., wheelchair, bedside commode, etc,.)?: A Lot Help needed moving to and from a bed to chair (including a wheelchair)?: A Little Help needed walking in hospital room?: A Little Help needed climbing 3-5 steps with a railing? : A Little 6 Click Score: 14    End of Session Equipment Utilized During Treatment: Gait belt Activity Tolerance: Patient tolerated treatment well;Patient limited by fatigue Patient left: in bed;with call bell/phone within reach;with family/visitor present Nurse Communication: Mobility status PT Visit Diagnosis: Unsteadiness on feet (R26.81);Other abnormalities of gait and mobility (R26.89);Other symptoms and signs involving the nervous system (R29.898)    Time: 6440-3474 PT Time Calculation (min) (ACUTE ONLY): 35 min   Charges:   PT Evaluation $PT Eval Moderate Complexity: 1 Mod PT Treatments $Gait Training: 8-22 mins  Samuella Bruin, [place]  Acute Rehab [phone]   Samuella Bruin 02/24/2018, 2:58 PM

## 2018-02-24 NOTE — Discharge Instructions (Signed)
Wound Care °Leave incision open to air. °You may shower. °Do not scrub directly on incision.  °Do not put any creams, lotions, or ointments on incision. °Activity °Walk each and every day, increasing distance each day. °No lifting greater than 5 lbs.  Avoid bending, arching, and twisting. °No driving for 2 weeks; may ride as a passenger locally. ° °Diet °Resume your normal diet.  °Return to Work °Will be discussed at you follow up appointment. °Call Your Doctor If Any of These Occur °Redness, drainage, or swelling at the wound.  °Temperature greater than 101 degrees. °Severe pain not relieved by pain medication. °Incision starts to come apart. °Follow Up Appt °Call today for appointment in 3 weeks ([phone]) or for problems.  If you have any hardware placed in your spine, you will need an x-ray before your appointment. °

## 2018-02-24 NOTE — Anesthesia Preprocedure Evaluation (Addendum)
Anesthesia Evaluation  Patient identified by MRN, date of birth, ID band Patient awake    Reviewed: Allergy & Precautions, NPO status , Patient's Chart, lab work & pertinent test results, reviewed documented beta blocker date and time   History of Anesthesia Complications Negative for: history of anesthetic complications  Airway Mallampati: II  TM Distance: >3 FB Neck ROM: Full    Dental  (+) Dental Advisory Given, Teeth Intact   Pulmonary sleep apnea and Continuous Positive Airway Pressure Ventilation , former smoker,    breath sounds clear to auscultation       Cardiovascular hypertension, Pt. on medications and Pt. on home beta blockers  Rhythm:Regular Rate:Normal     Neuro/Psych    GI/Hepatic GERD  ,  Endo/Other  negative endocrine ROS  Renal/GU negative Renal ROS     Musculoskeletal  (+) Arthritis ,   Abdominal   Peds  Hematology   Anesthesia Other Findings   Reproductive/Obstetrics                            Anesthesia Physical Anesthesia Plan  ASA: II  Anesthesia Plan: General   Post-op Pain Management:    Induction: Intravenous  PONV Risk Score and Plan: 2 and Ondansetron and Dexamethasone  Airway Management Planned: Oral ETT  Additional Equipment: None  Intra-op Plan:   Post-operative Plan: Extubation in OR  Informed Consent: I have reviewed the patients History and Physical, chart, labs and discussed the procedure including the risks, benefits and alternatives for the proposed anesthesia with the patient or authorized representative who has indicated his/her understanding and acceptance.   Dental advisory given  Plan Discussed with: CRNA and Surgeon  Anesthesia Plan Comments:        Anesthesia Quick Evaluation

## 2018-02-24 NOTE — Op Note (Signed)
02/24/2018  12:01 PM  PATIENT:  William [name]  67 y.o. male  With severe neurogenic claudication due to lumbar stenosis. I recommended a lumbar decompression as conservative treatment did not provide relief PRE-OPERATIVE DIAGNOSIS:  Lumbar stenosis with neurogenic claudication L4/5  POST-OPERATIVE DIAGNOSIS:  Lumbar stenosis with neurogenic claudication.  L4/5  PROCEDURE:  Procedure(s): Lumbar Four-Five semihemiLaminectomy/Foraminotomy  SURGEON: Surgeon(s): Ashok Pall, MD Eustace Moore, MD  ASSISTANTS:Jones, Shanon Brow  ANESTHESIA:   general  EBL:  Total I/O In: 1700 [I.V.:1700] Out: 20 [Blood:20]  BLOOD ADMINISTERED:none  COUNT:per nursing  DRAINS: none   SPECIMEN:  No Specimen  DICTATION: Ebon Ketchum was taken to the operating room, intubated, and placed under a general anesthetic without difficulty. He was positioned prone on a Penrose frame with all pressure points properly padded. His lumbar region was prepped and draped in a sterile manner. I infiltrated lidocaine into the planned incision. I opened the skin with a 10 blade and dissected sharply to the thoracolumbar fascia. I exposed in a subperiosteal fashion the lamina of L4 and L5. I confirmed my location with an intraoperative xray.  I performed semihemilaminectomies bilaterally of L4 using the drill and Kerrison punches. I removed very brittle ligamentum flavum exposing the thecal sac. I removed bone and ligament to decompress the spinal canal. I also used the punches to decompress the L4 roots and their entry in the lateral recess and foramina bilaterally. Dr. Ronnald Ramp assisted in the decompression.  After inspection I felt the thecal sac and nerve roots were free from compression. We closed the wound in layered fashion with vicryl sutures approximating the thoracolumbAr, subcutaneous, and subcuticular planes. I applied Dermabond for a sterile dressing. He was then rolled back to the OR stretcher and extubated.    PLAN OF CARE: Admit for overnight observation  PATIENT DISPOSITION:  PACU - hemodynamically stable.   Delay start of Pharmacological VTE agent (>24hrs) due to surgical blood loss or risk of bleeding:  yes

## 2018-02-24 NOTE — Anesthesia Procedure Notes (Signed)
Procedure Name: Intubation Date/Time: 02/24/2018 9:48 AM Performed by: Eligha Bridegroom, CRNA Pre-anesthesia Checklist: Patient identified, Emergency Drugs available, Suction available, Patient being monitored and Timeout performed Patient Re-evaluated:Patient Re-evaluated prior to induction Oxygen Delivery Method: Circle system utilized Preoxygenation: Pre-oxygenation with 100% oxygen Induction Type: IV induction Ventilation: Mask ventilation without difficulty and Oral airway inserted - appropriate to patient size Laryngoscope Size: Mac and 4 Grade View: Grade I Tube type: Oral Tube size: 7.5 mm Number of attempts: 1 Airway Equipment and Method: Stylet Placement Confirmation: ETT inserted through vocal cords under direct vision,  positive ETCO2 and breath sounds checked- equal and bilateral Secured at: 22 cm Tube secured with: Tape Dental Injury: Teeth and Oropharynx as per pre-operative assessment

## 2018-02-24 NOTE — Discharge Summary (Signed)
Physician Discharge Summary  Patient ID: William [name] MRN: [id number] DOB/AGE: [birth year] 69 y.o. Estimated body mass index is 29.98 kg/m as calculated from the following:   Height as of this encounter: 5\' 11"  (1.803 m).   Weight as of this encounter: 97.5 kg.   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  Consults: Significant Diagnostic Studies: Treatments: Decompressive laminectomy Discharge Exam: Blood pressure 139/77, pulse 69, temperature (!) 97.5 F (36.4 C), temperature source Oral, resp. rate 18, height 5\' 11"  (1.803 m), weight 97.5 kg, SpO2 95 %. Strength 5 out of 5 wound clean dry and intact  Disposition: Home   Allergies as of 02/24/2018      Reactions   Sulfa Antibiotics Other (See Comments)   UNSPECIFIED REACTION    Sulfasalazine Dermatitis   UNSPECIFIED SEVERITY REACTION       Medication List    TAKE these medications   allopurinol 100 MG tablet Commonly known as:  ZYLOPRIM Take 100 mg by mouth daily.   amLODipine 5 MG tablet Commonly known as:  NORVASC Take 5 mg by mouth daily.   aspirin EC 81 MG tablet Take 81 mg by mouth at bedtime.   atorvastatin 10 MG tablet Commonly known as:  LIPITOR Take 10 mg by mouth daily.   Chelated Magnesium 100 MG Tabs Take 400 mg by mouth 2 (two) times daily.   CoQ10 100 MG Caps Take 100 mg by mouth daily.   cyclobenzaprine 10 MG tablet Commonly known as:  FLEXERIL Take 1 tablet (10 mg total) by mouth 3 (three) times daily as needed for muscle  spasms.   Fish Oil 1200 MG Caps Take 2,400 mg by mouth 2 (two) times daily.   FLAXSEED OIL PO Take 2,000 mg by mouth 2 (two) times daily.   GLUCOSAMINE PO Take 2 tablets by mouth 2 (two) times daily.   HYDROcodone-acetaminophen 7.5-325 MG tablet Commonly known as:  NORCO Take 1 tablet by mouth every 6 (six) hours.   loratadine 10 MG tablet Commonly known as:  CLARITIN Take 10 mg by mouth daily.   losartan-hydrochlorothiazide 100-12.5 MG tablet Commonly known as:  HYZAAR Take 1 tablet by mouth daily.   meloxicam 15 MG tablet Commonly known as:  MOBIC Take 15 mg by mouth daily.   methocarbamol 500 MG tablet Commonly known as:  ROBAXIN Take 1-2 tablets (500-1,000 mg total) by mouth every 6 (six) hours as needed for muscle spasms.   metoprolol succinate 100 MG 24 hr tablet Commonly known as:  TOPROL-XL Take 100 mg by mouth daily.   multivitamin with minerals Tabs tablet Take 1 tablet by mouth daily.   Oxycodone HCl 10 MG Tabs Take 1 tablet (10 mg total) by mouth every 4 (four) hours as needed.   pantoprazole 40 MG tablet Commonly known as:  PROTONIX Take 40 mg by mouth daily before breakfast.   REFRESH 1.4-0.6 % Soln Generic drug:  Polyvinyl Alcohol-Povidone PF Place 2 drops into both eyes 3 (three) times daily as needed (for dry eyes).   tamsulosin 0.4 MG Caps capsule Commonly known as:  FLOMAX Take 0.4 mg by mouth  2 (two) times daily.   Turmeric Curcumin 500 MG Caps Take 500 mg by mouth 2 (two) times daily.   vitamin C 500 MG tablet Commonly known as:  ASCORBIC ACID Take 500 mg by mouth daily.        Signed: Khoury Siemon P 02/24/2018, 3:49 PM

## 2018-02-25 ENCOUNTER — Encounter (HOSPITAL_COMMUNITY): Payer: Self-pay | Admitting: Neurosurgery

## 2018-02-25 NOTE — Anesthesia Postprocedure Evaluation (Signed)
Anesthesia Post Note  Patient: William [name]  Procedure(s) Performed: Lumbar Four-Five Laminectomy/Foraminotomy (N/A Spine Lumbar)     Patient location during evaluation: PACU Anesthesia Type: General Level of consciousness: awake and alert Pain management: pain level controlled Vital Signs Assessment: post-procedure vital signs reviewed and stable Respiratory status: spontaneous breathing, nonlabored ventilation, respiratory function stable and patient connected to nasal cannula oxygen Cardiovascular status: blood pressure returned to baseline and stable Postop Assessment: no apparent nausea or vomiting Anesthetic complications: no    Last Vitals:  Vitals:   02/24/18 1249 02/24/18 1313  BP:  139/77  Pulse:  69  Resp:  18  Temp: (!) 36.3 C (!) 36.4 C  SpO2:  95%    Last Pain:  Vitals:   02/24/18 1313  TempSrc: Oral  PainSc:                  [name]

## 2018-07-04 ENCOUNTER — Encounter (HOSPITAL_COMMUNITY)
Admission: EM | Disposition: A | Discharge status: Rehabilitation Facility | Payer: Self-pay | Source: Other Acute Inpatient Hospital | Attending: Neurosurgery

## 2018-07-04 ENCOUNTER — Encounter (HOSPITAL_COMMUNITY): Payer: Self-pay | Admitting: Internal Medicine

## 2018-07-04 ENCOUNTER — Inpatient Hospital Stay: Admit: 2018-07-04 | Payer: Federal, State, Local not specified - PPO | Admitting: Neurosurgery

## 2018-07-04 ENCOUNTER — Emergency Department (HOSPITAL_COMMUNITY): Payer: Medicare Other | Admitting: Certified Registered Nurse Anesthetist

## 2018-07-04 ENCOUNTER — Inpatient Hospital Stay (HOSPITAL_COMMUNITY)
Admission: EM | Admit: 2018-07-04 | Discharge: 2018-07-08 | DRG: 909 | Disposition: A | Discharge status: Rehabilitation Facility | Payer: Medicare Other | Source: Other Acute Inpatient Hospital | Attending: Neurosurgery | Admitting: Neurosurgery

## 2018-07-04 DIAGNOSIS — S064X9A Epidural hemorrhage with loss of consciousness of unspecified duration, initial encounter: Secondary | ICD-10-CM | POA: Diagnosis present

## 2018-07-04 DIAGNOSIS — Y838 Other surgical procedures as the cause of abnormal reaction of the patient, or of later complication, without mention of misadventure at the time of the procedure: Secondary | ICD-10-CM | POA: Diagnosis present

## 2018-07-04 DIAGNOSIS — M545 Low back pain, unspecified: Secondary | ICD-10-CM

## 2018-07-04 DIAGNOSIS — G4730 Sleep apnea, unspecified: Secondary | ICD-10-CM | POA: Diagnosis not present

## 2018-07-04 DIAGNOSIS — Z87891 Personal history of nicotine dependence: Secondary | ICD-10-CM | POA: Diagnosis not present

## 2018-07-04 DIAGNOSIS — Z960 Presence of urogenital implants: Secondary | ICD-10-CM | POA: Diagnosis present

## 2018-07-04 DIAGNOSIS — R338 Other retention of urine: Secondary | ICD-10-CM | POA: Diagnosis not present

## 2018-07-04 DIAGNOSIS — Z7982 Long term (current) use of aspirin: Secondary | ICD-10-CM

## 2018-07-04 DIAGNOSIS — K219 Gastro-esophageal reflux disease without esophagitis: Secondary | ICD-10-CM | POA: Diagnosis not present

## 2018-07-04 DIAGNOSIS — I10 Essential (primary) hypertension: Secondary | ICD-10-CM | POA: Diagnosis present

## 2018-07-04 DIAGNOSIS — Z9181 History of falling: Secondary | ICD-10-CM

## 2018-07-04 DIAGNOSIS — S064XAA Epidural hemorrhage with loss of consciousness status unknown, initial encounter: Secondary | ICD-10-CM | POA: Diagnosis present

## 2018-07-04 DIAGNOSIS — G9761 Postprocedural hematoma of a nervous system organ or structure following a nervous system procedure: Principal | ICD-10-CM | POA: Diagnosis present

## 2018-07-04 DIAGNOSIS — Z791 Long term (current) use of non-steroidal anti-inflammatories (NSAID): Secondary | ICD-10-CM | POA: Diagnosis not present

## 2018-07-04 DIAGNOSIS — N401 Enlarged prostate with lower urinary tract symptoms: Secondary | ICD-10-CM | POA: Diagnosis present

## 2018-07-04 DIAGNOSIS — R531 Weakness: Secondary | ICD-10-CM | POA: Diagnosis not present

## 2018-07-04 DIAGNOSIS — Z79899 Other long term (current) drug therapy: Secondary | ICD-10-CM

## 2018-07-04 DIAGNOSIS — Z79891 Long term (current) use of opiate analgesic: Secondary | ICD-10-CM | POA: Diagnosis not present

## 2018-07-04 DIAGNOSIS — Z882 Allergy status to sulfonamides status: Secondary | ICD-10-CM | POA: Diagnosis not present

## 2018-07-04 DIAGNOSIS — R296 Repeated falls: Secondary | ICD-10-CM | POA: Diagnosis not present

## 2018-07-04 HISTORY — DX: Malignant neoplasm of bladder, unspecified: C67.9

## 2018-07-04 HISTORY — DX: Benign prostatic hyperplasia without lower urinary tract symptoms: N40.0

## 2018-07-04 HISTORY — DX: Radiculopathy, lumbar region: M54.16

## 2018-07-04 HISTORY — DX: Gout, unspecified: M10.9

## 2018-07-04 HISTORY — DX: Spinal stenosis, lumbar region without neurogenic claudication: M48.061

## 2018-07-04 HISTORY — DX: Bell's palsy: G51.0

## 2018-07-04 HISTORY — PX: HEMATOMA EVACUATION: SHX5118

## 2018-07-04 SURGERY — EVACUATION HEMATOMA
Anesthesia: General | Site: Back

## 2018-07-04 MED ORDER — OXYCODONE HCL 5 MG PO TABS
10.0000 mg | ORAL_TABLET | ORAL | Status: DC | PRN
  Administered 2018-07-04 – 2018-07-05 (×3): 10 mg via ORAL
  Filled 2018-07-04 (×2): qty 2

## 2018-07-04 MED ORDER — OXYCODONE HCL 5 MG PO TABS
5.0000 mg | ORAL_TABLET | ORAL | Status: DC | PRN
  Administered 2018-07-05: 5 mg via ORAL
  Filled 2018-07-04: qty 1

## 2018-07-04 MED ORDER — LOSARTAN POTASSIUM-HCTZ 100-12.5 MG PO TABS: 1.0000 | ORAL_TABLET | Freq: Every day | ORAL | Status: DC

## 2018-07-04 MED ORDER — METOPROLOL SUCCINATE ER 100 MG PO TB24
100.0000 mg | ORAL_TABLET | Freq: Every day | ORAL | Status: DC
  Administered 2018-07-05 – 2018-07-08 (×4): 100 mg via ORAL
  Filled 2018-07-04 (×4): qty 1

## 2018-07-04 MED ORDER — MAGNESIUM OXIDE 400 (241.3 MG) MG PO TABS
400.0000 mg | ORAL_TABLET | Freq: Two times a day (BID) | ORAL | Status: DC
  Administered 2018-07-05 – 2018-07-08 (×8): 400 mg via ORAL
  Filled 2018-07-04 (×8): qty 1

## 2018-07-04 MED ORDER — CYCLOBENZAPRINE HCL 10 MG PO TABS
10.0000 mg | ORAL_TABLET | Freq: Three times a day (TID) | ORAL | Status: DC | PRN
  Administered 2018-07-04 – 2018-07-05 (×2): 10 mg via ORAL
  Filled 2018-07-04: qty 1

## 2018-07-04 MED ORDER — THROMBIN 20000 UNITS EX SOLR
CUTANEOUS | Status: DC | PRN
  Administered 2018-07-04: 20 mL via TOPICAL

## 2018-07-04 MED ORDER — PHENYLEPHRINE 40 MCG/ML (10ML) SYRINGE FOR IV PUSH (FOR BLOOD PRESSURE SUPPORT)
PREFILLED_SYRINGE | INTRAVENOUS | Status: AC
  Filled 2018-07-04: qty 10

## 2018-07-04 MED ORDER — PANTOPRAZOLE SODIUM 40 MG PO TBEC
40.0000 mg | DELAYED_RELEASE_TABLET | Freq: Every day | ORAL | Status: DC
  Administered 2018-07-05 – 2018-07-08 (×4): 40 mg via ORAL
  Filled 2018-07-04 (×4): qty 1

## 2018-07-04 MED ORDER — LORATADINE 10 MG PO TABS
10.0000 mg | ORAL_TABLET | Freq: Every day | ORAL | Status: DC
  Administered 2018-07-05 – 2018-07-08 (×4): 10 mg via ORAL
  Filled 2018-07-04 (×4): qty 1

## 2018-07-04 MED ORDER — BISACODYL 10 MG RE SUPP: 10.0000 mg | Freq: Every day | RECTAL | Status: DC | PRN

## 2018-07-04 MED ORDER — 0.9 % SODIUM CHLORIDE (POUR BTL) OPTIME
TOPICAL | Status: DC | PRN
  Administered 2018-07-04: 1000 mL

## 2018-07-04 MED ORDER — FENTANYL CITRATE (PF) 250 MCG/5ML IJ SOLN
INTRAMUSCULAR | Status: AC
  Filled 2018-07-04: qty 5

## 2018-07-04 MED ORDER — MORPHINE SULFATE (PF) 4 MG/ML IV SOLN: 4.0000 mg | INTRAVENOUS | Status: DC | PRN

## 2018-07-04 MED ORDER — CEFAZOLIN SODIUM 1 G IJ SOLR
INTRAMUSCULAR | Status: AC
  Filled 2018-07-04: qty 20

## 2018-07-04 MED ORDER — SUGAMMADEX SODIUM 200 MG/2ML IV SOLN
INTRAVENOUS | Status: DC | PRN
  Administered 2018-07-04: 200 mg via INTRAVENOUS

## 2018-07-04 MED ORDER — ACETAMINOPHEN 650 MG RE SUPP: 650.0000 mg | RECTAL | Status: DC | PRN

## 2018-07-04 MED ORDER — OXYCODONE HCL 5 MG PO TABS
ORAL_TABLET | ORAL | Status: AC
  Administered 2018-07-04: 10 mg via ORAL
  Filled 2018-07-04: qty 2

## 2018-07-04 MED ORDER — HYDROCODONE-ACETAMINOPHEN 7.5-325 MG PO TABS
1.0000 | ORAL_TABLET | ORAL | Status: DC | PRN
  Administered 2018-07-05 – 2018-07-06 (×3): 1 via ORAL
  Filled 2018-07-04 (×3): qty 1

## 2018-07-04 MED ORDER — DEXAMETHASONE SODIUM PHOSPHATE 10 MG/ML IJ SOLN
INTRAMUSCULAR | Status: AC
  Filled 2018-07-04: qty 2

## 2018-07-04 MED ORDER — TAMSULOSIN HCL 0.4 MG PO CAPS
0.4000 mg | ORAL_CAPSULE | Freq: Two times a day (BID) | ORAL | Status: DC
  Administered 2018-07-05 – 2018-07-08 (×8): 0.4 mg via ORAL
  Filled 2018-07-04 (×8): qty 1

## 2018-07-04 MED ORDER — CEFAZOLIN SODIUM-DEXTROSE 2-3 GM-%(50ML) IV SOLR
INTRAVENOUS | Status: DC | PRN
  Administered 2018-07-04: 2 g via INTRAVENOUS

## 2018-07-04 MED ORDER — SODIUM CHLORIDE 0.9 % IV SOLN
INTRAVENOUS | Status: DC | PRN
  Administered 2018-07-04: 500 mL

## 2018-07-04 MED ORDER — MIDAZOLAM HCL 2 MG/2ML IJ SOLN
INTRAMUSCULAR | Status: AC
  Filled 2018-07-04: qty 2

## 2018-07-04 MED ORDER — SODIUM CHLORIDE 0.9% FLUSH: 3.0000 mL | INTRAVENOUS | Status: DC | PRN

## 2018-07-04 MED ORDER — AMLODIPINE BESYLATE 5 MG PO TABS
5.0000 mg | ORAL_TABLET | Freq: Every day | ORAL | Status: DC
  Administered 2018-07-05 – 2018-07-08 (×4): 5 mg via ORAL
  Filled 2018-07-04 (×4): qty 1

## 2018-07-04 MED ORDER — MENTHOL 3 MG MT LOZG: 1.0000 | LOZENGE | OROMUCOSAL | Status: DC | PRN

## 2018-07-04 MED ORDER — ROCURONIUM BROMIDE 50 MG/5ML IV SOSY
PREFILLED_SYRINGE | INTRAVENOUS | Status: AC
  Filled 2018-07-04: qty 5

## 2018-07-04 MED ORDER — LIDOCAINE 2% (20 MG/ML) 5 ML SYRINGE
INTRAMUSCULAR | Status: AC
  Filled 2018-07-04: qty 5

## 2018-07-04 MED ORDER — SODIUM CHLORIDE 0.9% FLUSH
3.0000 mL | Freq: Two times a day (BID) | INTRAVENOUS | Status: DC
  Administered 2018-07-04 – 2018-07-08 (×4): 3 mL via INTRAVENOUS

## 2018-07-04 MED ORDER — LACTATED RINGERS IV SOLN
INTRAVENOUS | Status: DC | PRN
  Administered 2018-07-04: 10:00:00 via INTRAVENOUS

## 2018-07-04 MED ORDER — THROMBIN 5000 UNITS EX SOLR
OROMUCOSAL | Status: DC | PRN
  Administered 2018-07-04: 5 mL via TOPICAL

## 2018-07-04 MED ORDER — VANCOMYCIN HCL 1000 MG IV SOLR
INTRAVENOUS | Status: AC
  Filled 2018-07-04: qty 1000

## 2018-07-04 MED ORDER — ROCURONIUM BROMIDE 100 MG/10ML IV SOLN
INTRAVENOUS | Status: DC | PRN
  Administered 2018-07-04: 50 mg via INTRAVENOUS

## 2018-07-04 MED ORDER — SODIUM CHLORIDE 0.9 % IV SOLN
250.0000 mL | INTRAVENOUS | Status: DC
  Administered 2018-07-04: 250 mL via INTRAVENOUS

## 2018-07-04 MED ORDER — ATORVASTATIN CALCIUM 10 MG PO TABS
10.0000 mg | ORAL_TABLET | Freq: Every day | ORAL | Status: DC
  Administered 2018-07-05 – 2018-07-07 (×3): 10 mg via ORAL
  Filled 2018-07-04 (×3): qty 1

## 2018-07-04 MED ORDER — ONDANSETRON HCL 4 MG/2ML IJ SOLN: 4.0000 mg | Freq: Four times a day (QID) | INTRAMUSCULAR | Status: DC | PRN

## 2018-07-04 MED ORDER — THROMBIN 20000 UNITS EX SOLR
CUTANEOUS | Status: AC
  Filled 2018-07-04: qty 20000

## 2018-07-04 MED ORDER — ONDANSETRON HCL 4 MG/2ML IJ SOLN: 4.0000 mg | Freq: Once | INTRAMUSCULAR | Status: DC | PRN

## 2018-07-04 MED ORDER — PHENYLEPHRINE HCL 10 MG/ML IJ SOLN
INTRAMUSCULAR | Status: DC | PRN
  Administered 2018-07-04: 80 ug via INTRAVENOUS

## 2018-07-04 MED ORDER — ONDANSETRON HCL 4 MG/2ML IJ SOLN
INTRAMUSCULAR | Status: AC
  Filled 2018-07-04: qty 2

## 2018-07-04 MED ORDER — HYDROMORPHONE HCL 1 MG/ML IJ SOLN: 0.2500 mg | INTRAMUSCULAR | Status: DC | PRN

## 2018-07-04 MED ORDER — PHENOL 1.4 % MT LIQD: 1.0000 | OROMUCOSAL | Status: DC | PRN

## 2018-07-04 MED ORDER — EPHEDRINE 5 MG/ML INJ
INTRAVENOUS | Status: AC
  Filled 2018-07-04: qty 10

## 2018-07-04 MED ORDER — FENTANYL CITRATE (PF) 250 MCG/5ML IJ SOLN
INTRAMUSCULAR | Status: DC | PRN
  Administered 2018-07-04: 100 ug via INTRAVENOUS
  Administered 2018-07-04 (×3): 50 ug via INTRAVENOUS

## 2018-07-04 MED ORDER — ALLOPURINOL 100 MG PO TABS
100.0000 mg | ORAL_TABLET | Freq: Every day | ORAL | Status: DC
  Administered 2018-07-05 – 2018-07-08 (×4): 100 mg via ORAL
  Filled 2018-07-04 (×4): qty 1

## 2018-07-04 MED ORDER — LIDOCAINE HCL (CARDIAC) PF 100 MG/5ML IV SOSY
PREFILLED_SYRINGE | INTRAVENOUS | Status: DC | PRN
  Administered 2018-07-04: 60 mg via INTRAVENOUS

## 2018-07-04 MED ORDER — SUCCINYLCHOLINE CHLORIDE 200 MG/10ML IV SOSY
PREFILLED_SYRINGE | INTRAVENOUS | Status: AC
  Filled 2018-07-04: qty 10

## 2018-07-04 MED ORDER — ONDANSETRON HCL 4 MG PO TABS: 4.0000 mg | ORAL_TABLET | Freq: Four times a day (QID) | ORAL | Status: DC | PRN

## 2018-07-04 MED ORDER — ACETAMINOPHEN 500 MG PO TABS
1000.0000 mg | ORAL_TABLET | Freq: Four times a day (QID) | ORAL | Status: AC
  Administered 2018-07-05 (×2): 1000 mg via ORAL
  Filled 2018-07-04 (×2): qty 2

## 2018-07-04 MED ORDER — ACETAMINOPHEN 325 MG PO TABS
650.0000 mg | ORAL_TABLET | ORAL | Status: DC | PRN
  Administered 2018-07-05 – 2018-07-07 (×5): 650 mg via ORAL
  Filled 2018-07-04 (×5): qty 2

## 2018-07-04 MED ORDER — PROPOFOL 10 MG/ML IV BOLUS
INTRAVENOUS | Status: AC
  Filled 2018-07-04: qty 20

## 2018-07-04 MED ORDER — CEFAZOLIN SODIUM-DEXTROSE 2-4 GM/100ML-% IV SOLN
2.0000 g | Freq: Three times a day (TID) | INTRAVENOUS | Status: AC
  Administered 2018-07-04 – 2018-07-05 (×2): 2 g via INTRAVENOUS
  Filled 2018-07-04 (×2): qty 100

## 2018-07-04 MED ORDER — BACITRACIN ZINC 500 UNIT/GM EX OINT
TOPICAL_OINTMENT | CUTANEOUS | Status: AC
  Filled 2018-07-04: qty 28.35

## 2018-07-04 MED ORDER — ONDANSETRON HCL 4 MG/2ML IJ SOLN
INTRAMUSCULAR | Status: DC | PRN
  Administered 2018-07-04: 4 mg via INTRAVENOUS

## 2018-07-04 MED ORDER — POLYVINYL ALCOHOL 1.4 % OP SOLN: 2.0000 [drp] | Freq: Three times a day (TID) | OPHTHALMIC | Status: DC | PRN

## 2018-07-04 MED ORDER — CYCLOBENZAPRINE HCL 10 MG PO TABS
ORAL_TABLET | ORAL | Status: AC
  Administered 2018-07-04: 10 mg via ORAL
  Filled 2018-07-04: qty 1

## 2018-07-04 MED ORDER — THROMBIN 5000 UNITS EX SOLR
CUTANEOUS | Status: AC
  Filled 2018-07-04: qty 5000

## 2018-07-04 MED ORDER — VANCOMYCIN HCL 1000 MG IV SOLR
INTRAVENOUS | Status: DC | PRN
  Administered 2018-07-04: 1000 mg

## 2018-07-04 MED ORDER — COQ10 100 MG PO CAPS: 100.0000 mg | ORAL_CAPSULE | Freq: Every day | ORAL | Status: DC

## 2018-07-04 MED ORDER — OXYCODONE HCL 5 MG PO TABS: 5.0000 mg | ORAL_TABLET | Freq: Once | ORAL | Status: DC | PRN

## 2018-07-04 MED ORDER — DOCUSATE SODIUM 100 MG PO CAPS
100.0000 mg | ORAL_CAPSULE | Freq: Two times a day (BID) | ORAL | Status: DC
  Administered 2018-07-04 – 2018-07-08 (×8): 100 mg via ORAL
  Filled 2018-07-04 (×8): qty 1

## 2018-07-04 MED ORDER — OXYCODONE HCL 5 MG/5ML PO SOLN: 5.0000 mg | Freq: Once | ORAL | Status: DC | PRN

## 2018-07-04 MED ORDER — BACITRACIN ZINC 500 UNIT/GM EX OINT
TOPICAL_OINTMENT | CUTANEOUS | Status: DC | PRN
  Administered 2018-07-04: 1 via TOPICAL

## 2018-07-04 MED ORDER — DEXAMETHASONE SODIUM PHOSPHATE 10 MG/ML IJ SOLN
INTRAMUSCULAR | Status: DC | PRN
  Administered 2018-07-04: 10 mg via INTRAVENOUS

## 2018-07-04 MED ORDER — PROPOFOL 10 MG/ML IV BOLUS
INTRAVENOUS | Status: DC | PRN
  Administered 2018-07-04: 180 mg via INTRAVENOUS

## 2018-07-04 SURGICAL SUPPLY — 46 items
BAG DECANTER FOR FLEXI CONT (MISCELLANEOUS) ×3 IMPLANT
BENZOIN TINCTURE PRP APPL 2/3 (GAUZE/BANDAGES/DRESSINGS) ×3 IMPLANT
BLADE CLIPPER SURG (BLADE) IMPLANT
CANISTER SUCT 3000ML PPV (MISCELLANEOUS) ×3 IMPLANT
CARTRIDGE OIL MAESTRO DRILL (MISCELLANEOUS) IMPLANT
CLOSURE STERI-STRIP 1/2X4 (GAUZE/BANDAGES/DRESSINGS) ×1
CLOSURE WOUND 1/2 X4 (GAUZE/BANDAGES/DRESSINGS)
CLSR STERI-STRIP ANTIMIC 1/2X4 (GAUZE/BANDAGES/DRESSINGS) ×2 IMPLANT
COVER WAND RF STERILE (DRAPES) ×3 IMPLANT
DIFFUSER DRILL AIR PNEUMATIC (MISCELLANEOUS) IMPLANT
DRAPE LAPAROTOMY 100X72X124 (DRAPES) ×3 IMPLANT
DRAPE POUCH INSTRU U-SHP 10X18 (DRAPES) IMPLANT
DRAPE SURG 17X23 STRL (DRAPES) ×12 IMPLANT
DRSG OPSITE POSTOP 4X6 (GAUZE/BANDAGES/DRESSINGS) ×3 IMPLANT
ELECT REM PT RETURN 9FT ADLT (ELECTROSURGICAL) ×3
ELECTRODE REM PT RTRN 9FT ADLT (ELECTROSURGICAL) ×1 IMPLANT
GAUZE 4X4 16PLY RFD (DISPOSABLE) IMPLANT
GAUZE SPONGE 4X4 12PLY STRL (GAUZE/BANDAGES/DRESSINGS) ×3 IMPLANT
GLOVE BIO SURGEON STRL SZ 6.5 (GLOVE) ×4 IMPLANT
GLOVE BIO SURGEON STRL SZ7 (GLOVE) ×3 IMPLANT
GLOVE BIO SURGEON STRL SZ8 (GLOVE) ×3 IMPLANT
GLOVE BIO SURGEON STRL SZ8.5 (GLOVE) ×3 IMPLANT
GLOVE BIO SURGEONS STRL SZ 6.5 (GLOVE) ×2
GLOVE EXAM NITRILE XL STR (GLOVE) IMPLANT
GOWN STRL REUS W/ TWL LRG LVL3 (GOWN DISPOSABLE) IMPLANT
GOWN STRL REUS W/ TWL XL LVL3 (GOWN DISPOSABLE) IMPLANT
GOWN STRL REUS W/TWL LRG LVL3 (GOWN DISPOSABLE)
GOWN STRL REUS W/TWL XL LVL3 (GOWN DISPOSABLE)
KIT BASIN OR (CUSTOM PROCEDURE TRAY) ×3 IMPLANT
KIT TURNOVER KIT B (KITS) ×3 IMPLANT
NEEDLE HYPO 22GX1.5 SAFETY (NEEDLE) IMPLANT
NS IRRIG 1000ML POUR BTL (IV SOLUTION) ×3 IMPLANT
OIL CARTRIDGE MAESTRO DRILL (MISCELLANEOUS)
PACK LAMINECTOMY NEURO (CUSTOM PROCEDURE TRAY) ×3 IMPLANT
PAD ARMBOARD 7.5X6 YLW CONV (MISCELLANEOUS) ×9 IMPLANT
STRIP CLOSURE SKIN 1/2X4 (GAUZE/BANDAGES/DRESSINGS) IMPLANT
SUT ETHILON 2 0 FS 18 (SUTURE) ×3 IMPLANT
SUT PROLENE 6 0 BV (SUTURE) IMPLANT
SUT VIC AB 1 CT1 18XBRD ANBCTR (SUTURE) ×1 IMPLANT
SUT VIC AB 1 CT1 8-18 (SUTURE) ×2
SUT VIC AB 2-0 CP2 18 (SUTURE) ×3 IMPLANT
SWAB COLLECTION DEVICE MRSA (MISCELLANEOUS) ×3 IMPLANT
SWAB CULTURE ESWAB REG 1ML (MISCELLANEOUS) ×3 IMPLANT
TOWEL GREEN STERILE (TOWEL DISPOSABLE) ×3 IMPLANT
TOWEL GREEN STERILE FF (TOWEL DISPOSABLE) ×3 IMPLANT
WATER STERILE IRR 1000ML POUR (IV SOLUTION) ×3 IMPLANT

## 2018-07-04 NOTE — Progress Notes (Signed)
Patient returned from Recovery - placed on 3 liters of oxygen due to patient known to have sleep apnea.  Oxygen saturations 89% and the patient appears sedated.  Oxygen saturations return to 98% with the oxygen.

## 2018-07-04 NOTE — Anesthesia Preprocedure Evaluation (Addendum)

## 2018-07-04 NOTE — H&P (Signed)
Subjective: The patient is a 68 year old white male on whom Dr. Christella Noa performed an L4-5 decompression, instrumentation and fusion 4 days ago at the [facility] specialist surgery center.  The patient initially did well and was discharged.  He tells me that he has had leg weakness and consequently has fallen 4- 5 times.  He went to [facility] and was found to have urinary retention.  A Foley catheter was placed.  He was also febrile.  He was transferred to [facility] for further care.  Presently the patient complains of back pain.  He feels his legs are weak.  He denies perineal numbness or incontinence.  He has had some bloody drainage from his wound.  He has not eaten or drank anything since yesterday.  He is not on any blood thinners.  Past Medical History:  Diagnosis Date  . Arthritis    OA- knees   . GERD (gastroesophageal reflux disease)   . Hypertension   . Sleep apnea    CPAP-in use q night, last study 5 yrs. ago    Past Surgical History:  Procedure Laterality Date  . BLADDER TUMOR EXCISION  2017   several cystocscopy for the excision followed by M. Annitta Needs  . BLEPHAROPLASTY Bilateral 2017   in [place]  . CYSTOSCOPY    . JOINT REPLACEMENT    . KNEE ARTHROSCOPY     2 on each side, one being a repair of the meniscus     . LUMBAR LAMINECTOMY/DECOMPRESSION MICRODISCECTOMY N/A 02/24/2018   Procedure: Lumbar Four-Five Laminectomy/Foraminotomy;  Surgeon: Ashok Pall, MD;  Location: [place];  Service: Neurosurgery;  Laterality: N/A;  . SHOULDER ARTHROSCOPY Right 2016  . TONSILLECTOMY    . TOTAL KNEE ARTHROPLASTY Right 12/08/2016   Procedure: TOTAL KNEE ARTHROPLASTY;  Surgeon: Vickey Huger, MD;  Location: [place];  Service: Orthopedics;  Laterality: Right;    Allergies  Allergen Reactions  . Sulfa Antibiotics Other (See Comments)    UNSPECIFIED REACTION   . Sulfasalazine Dermatitis    UNSPECIFIED SEVERITY REACTION     Social History   Tobacco Use  . Smoking  status: Former Smoker    Last attempt to quit: 08/29/2002    Years since quitting: 15.8  . Smokeless tobacco: Never Used  Substance Use Topics  . Alcohol use: Yes    Alcohol/week: 2.0 - 3.0 standard drinks    Types: 2 - 3 Shots of liquor per week    Comment: sometimes goes weeks without drinking     No family history on file. Prior to Admission medications   Medication Sig Start Date End Date Taking? Authorizing Provider  allopurinol (ZYLOPRIM) 100 MG tablet Take 100 mg by mouth daily.    [provider]  amLODipine (NORVASC) 5 MG tablet Take 5 mg by mouth daily.  08/25/16   [provider]  aspirin EC 81 MG tablet Take 81 mg by mouth at bedtime.    [provider]  atorvastatin (LIPITOR) 10 MG tablet Take 10 mg by mouth daily. 10/19/16   [provider]  Chelated Magnesium 100 MG TABS Take 400 mg by mouth 2 (two) times daily.    [provider]  Coenzyme Q10 (COQ10) 100 MG CAPS Take 100 mg by mouth daily.    [provider]  cyclobenzaprine (FLEXERIL) 10 MG tablet Take 1 tablet (10 mg total) by mouth 3 (three) times daily as needed for muscle spasms. 02/24/18   Kary Kos, MD  Flaxseed, Linseed, (FLAXSEED OIL  PO) Take 2,000 mg by mouth 2 (two) times daily.     [provider]  Glucosamine HCl (GLUCOSAMINE PO) Take 2 tablets by mouth 2 (two) times daily.    [provider]  HYDROcodone-acetaminophen (NORCO) 7.5-325 MG tablet Take 1 tablet by mouth every 6 (six) hours. 02/24/18   Kary Kos, MD  loratadine (CLARITIN) 10 MG tablet Take 10 mg by mouth daily.    [provider]  losartan-hydrochlorothiazide (HYZAAR) 100-12.5 MG tablet Take 1 tablet by mouth daily.    [provider]  meloxicam (MOBIC) 15 MG tablet Take 15 mg by mouth daily.    [provider]  methocarbamol (ROBAXIN) 500 MG tablet Take 1-2 tablets (500-1,000 mg total) by mouth every 6 (six) hours as needed for muscle spasms. Patient not  taking: Reported on 02/15/2018 12/09/16   Donia Ast, PA  metoprolol succinate (TOPROL-XL) 100 MG 24 hr tablet Take 100 mg by mouth daily. 09/27/16   [provider]  Multiple Vitamin (MULTIVITAMIN WITH MINERALS) TABS tablet Take 1 tablet by mouth daily.    [provider]  Omega-3 Fatty Acids (FISH OIL) 1200 MG CAPS Take 2,400 mg by mouth 2 (two) times daily.     [provider]  Oxycodone HCl 10 MG TABS Take 1 tablet (10 mg total) by mouth every 4 (four) hours as needed. Patient not taking: Reported on 02/15/2018 12/09/16   Donia Ast, PA  pantoprazole (PROTONIX) 40 MG tablet Take 40 mg by mouth daily before breakfast. 08/20/16   [provider]  Polyvinyl Alcohol-Povidone PF (REFRESH) 1.4-0.6 % SOLN Place 2 drops into both eyes 3 (three) times daily as needed (for dry eyes).    [provider]  tamsulosin (FLOMAX) 0.4 MG CAPS capsule Take 0.4 mg by mouth 2 (two) times daily. 08/25/16   [provider]  Turmeric Curcumin 500 MG CAPS Take 500 mg by mouth 2 (two) times daily.    [provider]  vitamin C (ASCORBIC ACID) 500 MG tablet Take 500 mg by mouth daily.    [provider]     Review of Systems  Positive ROS: As above  All other systems have been reviewed and were otherwise negative with the exception of those mentioned in the HPI and as above.  Objective: Vital signs in last 24 hours: Temp:  [97.7 F (36.5 C)] 97.7 F (36.5 C) (02/16 0942) Pulse Rate:  [80-81] 80 (02/16 0942) Resp:  [15-19] 15 (02/16 0942) BP: (146)/(72) 146/72 (02/16 0942) SpO2:  [95 %] 95 % (02/16 0942) Estimated body mass index is 29.98 kg/m as calculated from the following:   Height as of 02/24/18: 5\' 11"  (1.803 m).   Weight as of 02/24/18: 97.5 kg.   Physical exam  General: An alert and pleasant 68 year old white male on a stretcher complaining of back pain.  HEENT: Normocephalic, extraocular muscles are  intact  Thorax: Symmetric  Abdomen: Obese and soft  Extremities: Unremarkable  Back exam: The patient's lumbar incision has some bloody drainage.  There is no obvious infection.  Neurologic exam: The patient is alert and oriented x3.  The patient's strength is grossly normal in his bilateral quadricep, gastrocnemius and dorsiflexors.  He does not have any perineal numbness.   Data Review Lab Results  Component Value Date   WBC 7.9 02/19/2018   HGB 15.3 02/19/2018   HCT 44.5 02/19/2018   MCV 90.8 02/19/2018   PLT 176 02/19/2018   Lab Results  Component  Value Date   NA 140 02/19/2018   K 3.8 02/19/2018   CL 106 02/19/2018   CO2 25 02/19/2018   BUN 13 02/19/2018   CREATININE 0.96 02/19/2018   GLUCOSE 123 (H) 02/19/2018   No results found for: INR, PROTIME  Assessment/Plan: Fever, leg weakness, falls, urinary retention: I have discussed the situation with the patient.  I suspect he may have an epidural hematoma.  I have recommended an incision and drainage of his wound with presumed evacuation of epidural hematoma and to obtain cultures given his fever.  I described the surgery to him.  We have discussed the risks, benefits, alternatives, expected postoperative course, and likelihood of achieving our goals with surgery.  We have also discussed the alternative treatment option of a lumbar MRI and observation.  I have told him I suspect the MRI will show the typical epidural fluid and stenosis we see with the vast majority of recent postoperative MRIs.  I am not sure a lumbar MRI would help [place] much.  I have answered all his questions.  He has decided to proceed with surgery.   [name] 07/04/2018 9:57 AM

## 2018-07-04 NOTE — Op Note (Signed)
Brief history: The patient is a 68 year old white male on whom Dr. Christella Noa performed an L4-5 decompression, instrumentation and fusion 4 days ago at the [facility] specialist surgery center.  The patient initially did well but has developed worsening back pain and leg weakness.  He has taken a few falls.  He was seen at [facility] and noted to have approximately 500 cc of urinary retention and a fever.  A Foley catheter was placed.  The patient was transferred to [facility] for further neurosurgical care.  I discussed the situation with the patient.  I recommend he proceed with an incision and drainage of his wound for presumed epidural hematoma and to obtain wound cultures.  The patient has weighed the risks, benefits and alternatives to that surgery and decided to proceed with the operation.  Preop diagnosis: Urinary tension, fever, leg weakness, epidural hematoma  Postop diagnosis: The same  Procedure: Incision and drainage of wound and evacuation of epidural hematoma  Surgeon: Dr. Earle Gell  Assistant: Arnetha Massy nurse practitioner  Anesthesia: General tracheal  Estimated blood loss: Minimal  Specimens: Wound cultures  Drains: One large Hemovac drain in the epidural space  Complications: None  Description of procedure: The patient was brought to the operating room by the anesthesia team.  General endotracheal anesthesia was induced.  The patient was turned to the prone position on the Janvier frame.  His lumbosacral region was then prepared with Betadine scrub and Betadine solution.  Sterile drapes were applied.  I then used a scalpel to incise with the patient's fresh surgical scar.  Upon doing this we encountered liquefied hematoma under some pressure which was easily evacuated with suction.  I then divided the fascial sutures with the scalpel.  We spread the paraspinous musculature and encountered more liquefied and partially semisolid hematoma.  I inserted the  cerebellar retractor.  I also remove this with suction.  We inspected the dura.  It was well decompressed.  We then obtained hemostasis with bipolar cautery.  We placed a large Hemovac drain in the epidural space and tunneled it out through a separate stab wound.  We remove the cerebellar retractor and then reapproximated the lumbosacral fascia with interrupted #1 Vicryl suture.  We reapproximated the subcutaneous tissue with interrupted 2-0 Vicryl suture.  We reapproximate the skin with Steri-Strips and benzoin.  We secured the drain with a 2-0 nylon suture.  The wound was then coated with bacitracin admit.  A sterile dressing was applied.  The drapes were removed.  By report all sponge, instrument, and needle counts were correct at the end of this case.

## 2018-07-04 NOTE — Anesthesia Postprocedure Evaluation (Signed)
Anesthesia Post Note  Patient: William [name]  Procedure(s) Performed: Lumbar wound exploration and EVACUATION OF EPIDURAL HEMATOMA (N/A Back)     Patient location during evaluation: PACU Anesthesia Type: General Level of consciousness: awake and alert Pain management: pain level controlled Vital Signs Assessment: post-procedure vital signs reviewed and stable Respiratory status: spontaneous breathing, nonlabored ventilation, respiratory function stable and patient connected to nasal cannula oxygen Cardiovascular status: blood pressure returned to baseline and stable Postop Assessment: no apparent nausea or vomiting Anesthetic complications: no    Last Vitals:  Vitals:   07/04/18 1240 07/04/18 1525  BP: 129/63 (!) 153/91  Pulse: 77 73  Resp: 17 16  Temp: 36.5 C 36.6 C  SpO2: 98% 95%    Last Pain:  Vitals:   07/04/18 1525  TempSrc: Rectal  PainSc:                  [name]

## 2018-07-04 NOTE — Anesthesia Procedure Notes (Signed)
Procedure Name: Intubation Date/Time: 07/04/2018 10:27 AM Performed by: Shirlyn Goltz, CRNA Pre-anesthesia Checklist: Patient identified, Emergency Drugs available, Suction available and Patient being monitored Oxygen Delivery Method: Circle system utilized Preoxygenation: Pre-oxygenation with 100% oxygen Induction Type: IV induction Ventilation: Mask ventilation without difficulty Laryngoscope Size: Mac and 4 Grade View: Grade I Tube type: Oral Tube size: 7.5 mm Number of attempts: 1 Airway Equipment and Method: Stylet Placement Confirmation: ETT inserted through vocal cords under direct vision,  positive ETCO2 and breath sounds checked- equal and bilateral Secured at: 21 cm Tube secured with: Tape Dental Injury: Teeth and Oropharynx as per pre-operative assessment

## 2018-07-04 NOTE — Progress Notes (Signed)
Report received from [place], [place] ED. Pre-op care initiated.

## 2018-07-04 NOTE — Progress Notes (Signed)
Subjective: The patient is somnolent but arousable.  He is in no apparent distress.  Objective: Vital signs in last 24 hours: Temp:  [97.7 F (36.5 C)] 97.7 F (36.5 C) (02/16 0942) Pulse Rate:  [80-81] 80 (02/16 0942) Resp:  [15-19] 15 (02/16 0942) BP: (146)/(72) 146/72 (02/16 0942) SpO2:  [95 %] 95 % (02/16 0942) Estimated body mass index is 29.98 kg/m as calculated from the following:   Height as of 02/24/18: 5\' 11"  (1.803 m).   Weight as of 02/24/18: 97.5 kg.   Intake/Output from previous day: No intake/output data recorded. Intake/Output this shift: No intake/output data recorded.  Physical exam the patient is somnolent but arousable.  He is moving his lower extremities well.  Lab Results: No results for input(s): WBC, HGB, HCT, PLT in the last 72 hours. BMET No results for input(s): NA, K, CL, CO2, GLUCOSE, BUN, CREATININE, CALCIUM in the last 72 hours.  Studies/Results: No results found.  Assessment/Plan: The patient is doing well.  LOS: 0 days     [name] 07/04/2018, 11:29 AM

## 2018-07-04 NOTE — ED Provider Notes (Signed)
Spoke with Neurosurgery Dr. Arnoldo Morale at bedside. Plans to direct admit.  Bufford Lope, DO PGY-3, [facility] 07/04/2018 9:49 AM     Bufford Lope, DO 07/04/18 3225    Gareth Morgan, MD 07/08/18 1100

## 2018-07-04 NOTE — Progress Notes (Signed)
Orthopedic Tech Progress Note Patient Details:  William [name] [birth year] [id number] Called in brace order Patient ID: [name], male   DOB: [birth year], 68 y.o.   MRN: [id number]   [name] 07/04/2018, 1:16 PM

## 2018-07-04 NOTE — ED Triage Notes (Addendum)
Pt BIB [facility] from [facility] for evaluation of back pain. Pt arrived at [place] at 5:30am and was febrile at 101.6. Afebrile for this RN at this time. Pt had L4/L5 fusion Thursday 07/01/2018. Pt started to feel weak yesterday and reports falling 4 times at home. Given 4mg  morphine at 0830, 3.375 zosyn, and 2grams vancomycin. Pt also experiencing acute urinary retention and foley placed at [facility].

## 2018-07-04 NOTE — Transfer of Care (Signed)
Immediate Anesthesia Transfer of Care Note  Patient: William [name]  Procedure(s) Performed: Lumbar wound exploration and EVACUATION OF EPIDURAL HEMATOMA (N/A Back)  Patient Location: PACU  Anesthesia Type:General  Level of Consciousness: awake, alert , oriented and patient cooperative  Airway & Oxygen Therapy: Patient Spontanous Breathing and Patient connected to nasal cannula oxygen  Post-op Assessment: Report given to RN and Post -op Vital signs reviewed and stable  Post vital signs: Reviewed and stable  Last Vitals:  Vitals Value Taken Time  BP 164/97 07/04/2018 11:25 AM  Temp    Pulse 88 07/04/2018 11:32 AM  Resp 15 07/04/2018 11:32 AM  SpO2 99 % 07/04/2018 11:32 AM  Vitals shown include unvalidated device data.  Last Pain:  Vitals:   07/04/18 0942  TempSrc: Oral         Complications: No apparent anesthesia complications

## 2018-07-04 NOTE — ED Notes (Signed)
Neurosurgeon bedside

## 2018-07-05 ENCOUNTER — Encounter (HOSPITAL_COMMUNITY): Payer: Self-pay | Admitting: Neurosurgery

## 2018-07-05 DIAGNOSIS — G9340 Encephalopathy, unspecified: Secondary | ICD-10-CM | POA: Diagnosis present

## 2018-07-05 DIAGNOSIS — R410 Disorientation, unspecified: Secondary | ICD-10-CM | POA: Diagnosis not present

## 2018-07-05 DIAGNOSIS — Z87891 Personal history of nicotine dependence: Secondary | ICD-10-CM | POA: Diagnosis not present

## 2018-07-05 DIAGNOSIS — R740 Nonspecific elevation of levels of transaminase and lactic acid dehydrogenase [LDH]: Secondary | ICD-10-CM | POA: Diagnosis not present

## 2018-07-05 DIAGNOSIS — S064XAA Epidural hemorrhage with loss of consciousness status unknown, initial encounter: Secondary | ICD-10-CM | POA: Diagnosis present

## 2018-07-05 DIAGNOSIS — F05 Delirium due to known physiological condition: Secondary | ICD-10-CM | POA: Diagnosis present

## 2018-07-05 DIAGNOSIS — S064X9A Epidural hemorrhage with loss of consciousness of unspecified duration, initial encounter: Secondary | ICD-10-CM | POA: Diagnosis present

## 2018-07-05 DIAGNOSIS — Z79899 Other long term (current) drug therapy: Secondary | ICD-10-CM | POA: Diagnosis not present

## 2018-07-05 DIAGNOSIS — M5416 Radiculopathy, lumbar region: Secondary | ICD-10-CM | POA: Diagnosis not present

## 2018-07-05 DIAGNOSIS — R531 Weakness: Secondary | ICD-10-CM | POA: Diagnosis present

## 2018-07-05 DIAGNOSIS — Z960 Presence of urogenital implants: Secondary | ICD-10-CM | POA: Diagnosis present

## 2018-07-05 DIAGNOSIS — N390 Urinary tract infection, site not specified: Secondary | ICD-10-CM | POA: Diagnosis not present

## 2018-07-05 DIAGNOSIS — Z882 Allergy status to sulfonamides status: Secondary | ICD-10-CM | POA: Diagnosis not present

## 2018-07-05 DIAGNOSIS — I10 Essential (primary) hypertension: Secondary | ICD-10-CM | POA: Diagnosis present

## 2018-07-05 DIAGNOSIS — D62 Acute posthemorrhagic anemia: Secondary | ICD-10-CM | POA: Diagnosis present

## 2018-07-05 DIAGNOSIS — Z8052 Family history of malignant neoplasm of bladder: Secondary | ICD-10-CM | POA: Diagnosis not present

## 2018-07-05 DIAGNOSIS — R0989 Other specified symptoms and signs involving the circulatory and respiratory systems: Secondary | ICD-10-CM | POA: Diagnosis not present

## 2018-07-05 DIAGNOSIS — N401 Enlarged prostate with lower urinary tract symptoms: Secondary | ICD-10-CM | POA: Diagnosis present

## 2018-07-05 DIAGNOSIS — Z79891 Long term (current) use of opiate analgesic: Secondary | ICD-10-CM | POA: Diagnosis not present

## 2018-07-05 DIAGNOSIS — E119 Type 2 diabetes mellitus without complications: Secondary | ICD-10-CM | POA: Diagnosis not present

## 2018-07-05 DIAGNOSIS — K219 Gastro-esophageal reflux disease without esophagitis: Secondary | ICD-10-CM | POA: Diagnosis present

## 2018-07-05 DIAGNOSIS — K5901 Slow transit constipation: Secondary | ICD-10-CM | POA: Diagnosis not present

## 2018-07-05 DIAGNOSIS — Y838 Other surgical procedures as the cause of abnormal reaction of the patient, or of later complication, without mention of misadventure at the time of the procedure: Secondary | ICD-10-CM | POA: Diagnosis present

## 2018-07-05 DIAGNOSIS — Z8551 Personal history of malignant neoplasm of bladder: Secondary | ICD-10-CM | POA: Diagnosis not present

## 2018-07-05 DIAGNOSIS — G4730 Sleep apnea, unspecified: Secondary | ICD-10-CM | POA: Diagnosis present

## 2018-07-05 DIAGNOSIS — Z6827 Body mass index (BMI) 27.0-27.9, adult: Secondary | ICD-10-CM | POA: Diagnosis not present

## 2018-07-05 DIAGNOSIS — M48062 Spinal stenosis, lumbar region with neurogenic claudication: Secondary | ICD-10-CM | POA: Diagnosis present

## 2018-07-05 DIAGNOSIS — E871 Hypo-osmolality and hyponatremia: Secondary | ICD-10-CM | POA: Diagnosis not present

## 2018-07-05 DIAGNOSIS — Z791 Long term (current) use of non-steroidal anti-inflammatories (NSAID): Secondary | ICD-10-CM | POA: Diagnosis not present

## 2018-07-05 DIAGNOSIS — Z7982 Long term (current) use of aspirin: Secondary | ICD-10-CM | POA: Diagnosis not present

## 2018-07-05 DIAGNOSIS — E46 Unspecified protein-calorie malnutrition: Secondary | ICD-10-CM | POA: Diagnosis present

## 2018-07-05 DIAGNOSIS — M48061 Spinal stenosis, lumbar region without neurogenic claudication: Secondary | ICD-10-CM | POA: Diagnosis not present

## 2018-07-05 DIAGNOSIS — G4733 Obstructive sleep apnea (adult) (pediatric): Secondary | ICD-10-CM | POA: Diagnosis not present

## 2018-07-05 DIAGNOSIS — R338 Other retention of urine: Secondary | ICD-10-CM | POA: Diagnosis present

## 2018-07-05 DIAGNOSIS — M1711 Unilateral primary osteoarthritis, right knee: Secondary | ICD-10-CM | POA: Diagnosis present

## 2018-07-05 DIAGNOSIS — G9761 Postprocedural hematoma of a nervous system organ or structure following a nervous system procedure: Secondary | ICD-10-CM | POA: Diagnosis present

## 2018-07-05 DIAGNOSIS — Z9181 History of falling: Secondary | ICD-10-CM | POA: Diagnosis not present

## 2018-07-05 DIAGNOSIS — W19XXXA Unspecified fall, initial encounter: Secondary | ICD-10-CM | POA: Diagnosis not present

## 2018-07-05 DIAGNOSIS — R5381 Other malaise: Secondary | ICD-10-CM | POA: Diagnosis present

## 2018-07-05 DIAGNOSIS — R7309 Other abnormal glucose: Secondary | ICD-10-CM | POA: Diagnosis not present

## 2018-07-05 DIAGNOSIS — N39498 Other specified urinary incontinence: Secondary | ICD-10-CM | POA: Diagnosis present

## 2018-07-05 DIAGNOSIS — Z96659 Presence of unspecified artificial knee joint: Secondary | ICD-10-CM | POA: Diagnosis not present

## 2018-07-05 DIAGNOSIS — Z8249 Family history of ischemic heart disease and other diseases of the circulatory system: Secondary | ICD-10-CM | POA: Diagnosis not present

## 2018-07-05 DIAGNOSIS — R739 Hyperglycemia, unspecified: Secondary | ICD-10-CM | POA: Diagnosis not present

## 2018-07-05 DIAGNOSIS — M109 Gout, unspecified: Secondary | ICD-10-CM | POA: Diagnosis present

## 2018-07-05 DIAGNOSIS — R296 Repeated falls: Secondary | ICD-10-CM | POA: Diagnosis present

## 2018-07-05 LAB — CBC
HCT: 32.9 % — ABNORMAL LOW (ref 39.0–52.0)
Hemoglobin: 11.1 g/dL — ABNORMAL LOW (ref 13.0–17.0)
MCH: 29.5 pg (ref 26.0–34.0)
MCHC: 33.7 g/dL (ref 30.0–36.0)
MCV: 87.5 fL (ref 80.0–100.0)
Platelets: 186 10*3/uL (ref 150–400)
RBC: 3.76 MIL/uL — ABNORMAL LOW (ref 4.22–5.81)
RDW: 11.9 % (ref 11.5–15.5)
WBC: 10.1 10*3/uL (ref 4.0–10.5)
nRBC: 0 % (ref 0.0–0.2)

## 2018-07-05 LAB — BASIC METABOLIC PANEL
Anion gap: 10 (ref 5–15)
BUN: 20 mg/dL (ref 8–23)
CO2: 27 mmol/L (ref 22–32)
CREATININE: 1.02 mg/dL (ref 0.61–1.24)
Calcium: 9 mg/dL (ref 8.9–10.3)
Chloride: 100 mmol/L (ref 98–111)
GFR calc Af Amer: >60 mL/min (ref >60)
GFR calc non Af Amer: >60 mL/min (ref >60)
GLUCOSE: 181 mg/dL — AB (ref 70–99)
Potassium: 4.1 mmol/L (ref 3.5–5.1)
Sodium: 137 mmol/L (ref 135–145)

## 2018-07-05 LAB — POCT I-STAT 4, (NA,K, GLUC, HGB,HCT)
Glucose, Bld: 182 mg/dL — ABNORMAL HIGH (ref 70–99)
HCT: 34 % — ABNORMAL LOW (ref 39.0–52.0)
Hemoglobin: 11.6 g/dL — ABNORMAL LOW (ref 13.0–17.0)
Potassium: 3.9 mmol/L (ref 3.5–5.1)
Sodium: 137 mmol/L (ref 135–145)

## 2018-07-05 MED ORDER — LOSARTAN POTASSIUM 50 MG PO TABS
100.0000 mg | ORAL_TABLET | Freq: Every day | ORAL | Status: DC
  Administered 2018-07-05 – 2018-07-08 (×4): 100 mg via ORAL
  Filled 2018-07-05 (×4): qty 2

## 2018-07-05 MED ORDER — HYDROCHLOROTHIAZIDE 12.5 MG PO CAPS
12.5000 mg | ORAL_CAPSULE | Freq: Every day | ORAL | Status: DC
  Administered 2018-07-05 – 2018-07-08 (×4): 12.5 mg via ORAL
  Filled 2018-07-05 (×4): qty 1

## 2018-07-05 NOTE — Plan of Care (Signed)
  Problem: Education: Goal: Knowledge of General Education information will improve Description Including pain rating scale, medication(s)/side effects and non-pharmacologic comfort measures Outcome: Progressing   Problem: Health Behavior/Discharge Planning: Goal: Ability to manage health-related needs will improve Outcome: Progressing   

## 2018-07-05 NOTE — Progress Notes (Signed)
Rehab Admissions Coordinator Note:  Patient was screened by Cleatrice Burke for appropriateness for an Inpatient Acute Rehab Consult per PT and OT recommendations.  At this time, we are recommending Inpatient Rehab consult. Please place order for consult.  Cleatrice Burke 07/05/2018, 7:04 PM  I can be reached at [phone].

## 2018-07-05 NOTE — Progress Notes (Signed)
Patient has been trying to get OOB without assistance.  He is unable to even sit on the side of the bed.  He keeps leaning over.  He is a very high fall risk.  He is impulsive and is not listening to staff.  Bed alarm keeps going off every 10 mins.  He is trying to get up to urinate but when staff comes to help he ends up moving urinal and urinating on the bed.

## 2018-07-05 NOTE — Progress Notes (Addendum)
   Providing Compassionate, Quality Care - Together   Subjective: Patient reports weakness in lower extremities. He is trying to avoid pain medication. PT and OT express concern regarding patient's ability to be discharged to home. Nursing report removal of foley catheter at 0945 today. Patient has not voided yet. Denies perineal numbness. He is sitting up in the chair with his brace on, eating lunch upon assessment.  Objective: Vital signs in last 24 hours: Temp:  [97 F (36.1 C)-99.5 F (37.5 C)] 98.4 F (36.9 C) (02/17 1042) Pulse Rate:  [70-97] 82 (02/17 1042) Resp:  [16-20] 20 (02/17 0301) BP: (111-166)/(63-91) 149/71 (02/17 1042) SpO2:  [93 %-99 %] 97 % (02/17 1042)  Intake/Output from previous day: 02/16 0701 - 02/17 0700 In: 26 [P.O.:120; I.V.:700] Out: 1450 [Urine:1400; Drains:30; Blood:20] Intake/Output this shift: Total I/O In: 240 [P.O.:240] Out: 920 [Urine:900; Drains:20]  Alert and oriented x 4 MAE Strength 5/5 BUE, 4+/5 BLE PERRLA Incision clean, dry, intact, covered with honeycomb dressing Hemovac in place 50 mL out total since placement   Lab Results: Recent Labs    07/05/18 0221  WBC 10.1  HGB 11.1*  HCT 32.9*  PLT 186   BMET Recent Labs    07/05/18 0221  NA 137  K 4.1  CL 100  CO2 27  GLUCOSE 181*  BUN 20  CREATININE 1.02  CALCIUM 9.0    Studies/Results: No results found.  Assessment/Plan: Mr. Lar[place] in 1 day status post incision and drainage of epidural hematoma. He is 5 days status post L4-5 decompression, instrumentation, and fusion by Dr. Christella Noa at the [facility]. He is still reporting lower extremity weakness. Therapies are concerned he may need SNF placement.   LOS: 0 days    -Changed admission status to inpatient -Nursing staff to monitor for urinary retention; May I and O up to three times before replacing foley catheter if necessary. Bladder scan at 6 hours if no urine -Continue mobilizing with  therapies. Will await recommendations from PT and OT for discharge planning. -Discontinue hemovac   Viona Gilmore, DNP, AGNP-C Nurse Practitioner  [facility]. [address], [address], [place], [place] [address] P: [phone]    F: [phone]  07/05/2018, 12:00 PM

## 2018-07-05 NOTE — Evaluation (Signed)
Occupational Therapy Evaluation Patient Details Name: William [name] MRN: [id number] DOB: February 22, 1951 Today's Date: 07/05/2018    History of Present Illness The patient is a 68 year old white male on whom Dr. Christella Noa performed an L4-5 decompression, instrumentation and fusion on 2/12 at the [facility] specialist surgery center.  The patient initially did well and was discharged. After discharge, leg weakness onset, and he had 4-5 falls and urinary retention as well as fever; Now s/p Incision and drainage of wound and evacuation of epidural hematoma;  has a past medical history of Arthritis, GERD (gastroesophageal reflux disease), Hypertension, and Sleep apnea.   Clinical Impression   PTA Pt was at home with increased falls and requiring assist for ADL post-back sx. Pt is currently max A for LB ADL, mod A for transfers and severely decreased cognition and safety awareness. Pt impulsive throughout session, requires cues for safety and back precautions throughout. Max A to don/doff brace. Pt is currently very high fall risk and at this time recommending continued skilled OT in the acute setting as well as afterwards at the post-acute setting to maximize safety and independent in ADL and transfers.    Follow Up Recommendations  CIR vs SNF;Supervision/Assistance - 24 hour    Equipment Recommendations  Other (comment)(Pt has appropriate DME from sx)    Recommendations for Other Services       Precautions / Restrictions Precautions Precautions: Back;Fall Precaution Booklet Issued: Yes (comment) Required Braces or Orthoses: Spinal Brace Spinal Brace: Applied in sitting position Restrictions Weight Bearing Restrictions: No      Mobility Bed Mobility Overal bed mobility: Needs Assistance Bed Mobility: Rolling;Sit to Sidelying Rolling: Min guard       Sit to sidelying: Mod assist General bed mobility comments: assist for BLE back into bed, cues as Pt immediately tries to lie straight  back and ended up crooked and uncomfortable - Pt very impulsive  Transfers Overall transfer level: Needs assistance Equipment used: Rolling walker (2 wheeled) Transfers: Sit to/from [facility] Sit to Stand: Mod assist Stand pivot transfers: Mod assist       General transfer comment: Mod assist to power up from recliner; Noted a tendency to brace backs of LEs against surfaces for stabiltiy -- indicative of high fall risk; cues for hand placement; Weak LEs, and required heavy mod assist for support with small, pivot steps bed to recliner; heavy dependence on RW    Balance Overall balance assessment: Needs assistance;History of Falls Sitting-balance support: Bilateral upper extremity supported;Feet supported Sitting balance-Leahy Scale: Poor(approaching Fair) Sitting balance - Comments: initial posterior lean   Standing balance support: Bilateral upper extremity supported;During functional activity Standing balance-Leahy Scale: Poor Standing balance comment: Heavy dependence on UE support on RW                           ADL either performed or assessed with clinical judgement   ADL Overall ADL's : Needs assistance/impaired Eating/Feeding: Set up   Grooming: Set up;Sitting   Upper Body Bathing: Moderate assistance   Lower Body Bathing: Moderate assistance;Cueing for back precautions   Upper Body Dressing : Maximal assistance Upper Body Dressing Details (indicate cue type and reason): to don/doff brace, max cues, max sequencing Lower Body Dressing: Maximal assistance;Sit to/from stand   Toilet Transfer: Moderate assistance;Stand-pivot;RW Toilet Transfer Details (indicate cue type and reason): cues for safety and back precautions throughout- required cues for safe hand placement   Toileting - Clothing Manipulation Details (indicate  cue type and reason): able to perform peri care at bed level for front     Functional mobility during ADLs: Moderate  assistance;Cueing for sequencing;Cueing for safety;Rolling walker General ADL Comments: Pt with decreased cognition, safety, and access to LB for ADL - Pt presenting as high fall risk throughout session     Vision Baseline Vision/History: Wears glasses Wears Glasses: At all times Patient Visual Report: No change from baseline       Perception     Praxis      Pertinent Vitals/Pain Faces Pain Scale: Hurts little more Pain Location: back, incisional pain at end of session Pain Descriptors / Indicators: Aching     Hand Dominance Right   Extremity/Trunk Assessment Upper Extremity Assessment Upper Extremity Assessment: Overall WFL for tasks assessed   Lower Extremity Assessment Lower Extremity Assessment: Defer to PT evaluation   Cervical / Trunk Assessment Cervical / Trunk Assessment: (Drain in place)   Communication Communication Communication: No difficulties;Other (comment)(but noted very distractible)   Cognition Arousal/Alertness: Awake/alert Behavior During Therapy: WFL for tasks assessed/performed;Restless(easily distractible) Overall Cognitive Status: Impaired/Different from baseline Area of Impairment: Attention;Memory;Following commands;Safety/judgement;Awareness;Problem solving                   Current Attention Level: Sustained Memory: Decreased recall of precautions;Decreased short-term memory Following Commands: Follows one step commands consistently Safety/Judgement: Decreased awareness of safety;Decreased awareness of deficits Awareness: Emergent Problem Solving: Difficulty sequencing;Requires verbal cues;Requires tactile cues General Comments: pt impulsive throughout. He could not recall precautions, and keeps complaining that "no one has been in my room all day" which he keeps telling everyone [place].    General Comments  Pt had taken off brace in the chair (it was still around him, just not on) and when I asked him he said "you  took it off me!"    Exercises     Shoulder Instructions      Home Living Family/patient expects to be discharged to:: Private residence Living Arrangements: Spouse/significant other(girlfriend) Available Help at Discharge: Family;Available PRN/intermittently Type of Home: House Home Access: Stairs to enter [employer] of Steps: 1 Entrance Stairs-Rails: None Home Layout: Two level;Able to live on main level with bedroom/bathroom(office and computer in basement. ) Alternate Level Stairs-Number of Steps: flight Alternate Level Stairs-Rails: Right;Left Bathroom Shower/Tub: Teacher, early years/pre: Standard     Home Equipment: Environmental consultant - 2 wheels;Bedside commode          Prior Functioning/Environment Level of Independence: Independent(prior to initial surgery 10/19)        Comments: PLOF prior to initial sugery in Oct 19:  still driving, enjoys spending time in pool, had trouble dressing prior to sx; since surgery on 2/12, LE weakness, multiple falls        OT Problem List: Decreased activity tolerance;Impaired balance (sitting and/or standing);Decreased cognition;Decreased safety awareness;Decreased knowledge of use of DME or AE;Decreased knowledge of precautions;Pain      OT Treatment/Interventions: Self-care/ADL training;DME and/or AE instruction;Therapeutic activities;Patient/family education;Balance training    OT Goals(Current goals can be found in the care plan section) Acute Rehab OT Goals Patient Stated Goal: get stronger OT Goal Formulation: With patient Time For Goal Achievement: 07/19/18 Potential to Achieve Goals: Good ADL Goals Pt Will Perform Grooming: with supervision;standing Pt Will Perform Lower Body Bathing: with supervision;sitting/lateral leans;with adaptive equipment Pt Will Perform Upper Body Dressing: with modified independence;sitting Pt Will Perform Lower Body Dressing: with supervision;with adaptive equipment;sit to/from  stand Pt Will Transfer  to Toilet: with min guard assist;ambulating Pt Will Perform Toileting - Clothing Manipulation and hygiene: with supervision;sit to/from stand Additional ADL Goal #1: Pt will perform bed mobility (demonstrating log roll technique for back precautions) at supervision level prior to engaging in ADL  OT Frequency: Min 2X/week   Barriers to D/C: Decreased caregiver support  has a girfriend, but not 24 hour support       Co-evaluation              AM-PAC OT "6 Clicks" Daily Activity     Outcome Measure Help from another person eating meals?: None Help from another person taking care of personal grooming?: A Little Help from another person toileting, which includes using toliet, bedpan, or urinal?: A Lot Help from another person bathing (including washing, rinsing, drying)?: A Lot Help from another person to put on and taking off regular upper body clothing?: A Lot Help from another person to put on and taking off regular lower body clothing?: A Lot 6 Click Score: 15   End of Session Equipment Utilized During Treatment: Gait belt;Rolling walker;Back brace Nurse Communication: Mobility status;Precautions;Other (comment)(confusion)  Activity Tolerance: Patient tolerated treatment well(fatigues very quickly) Patient left: in bed;with call bell/phone within reach;with bed alarm set  OT Visit Diagnosis: Unsteadiness on feet (R26.81);Other abnormalities of gait and mobility (R26.89);Repeated falls (R29.6);History of falling (Z91.81);Muscle weakness (generalized) (M62.81);Other symptoms and signs involving cognitive function;Pain Pain - part of body: (back)                Time: 0350-0938 OT Time Calculation (min): 24 min Charges:  OT General Charges $OT Visit: 1 Visit OT Evaluation $OT Eval Moderate Complexity: 1 Mod OT Treatments $Self Care/Home Management : 8-22 mins  Hulda Humphrey OTR/L Acute Rehabilitation Services Pager: [phone] Office:  [place] 07/05/2018, 4:49 PM

## 2018-07-05 NOTE — Evaluation (Signed)
Physical Therapy Evaluation Patient Details Name: William [name] MRN: [id number] DOB: [birth year] Today's Date: 07/05/2018   History of Present Illness  The patient is a 68 year old white male on whom Dr. Christella Noa performed an L4-5 decompression, instrumentation and fusion on 2/12 at the [facility] specialist surgery center.  The patient initially did well and was discharged. After discharge, leg weakness onset, and he had 4-5 falls and urinary retention as well as fever; Now s/p Incision and drainage of wound and evacuation of epidural hematoma;  has a past medical history of Arthritis, GERD (gastroesophageal reflux disease), Hypertension, and Sleep apnea.  Clinical Impression   Patient is s/p above surgery resulting in functional limitations due to the deficits listed below (see PT Problem List). Was independent prior to back surgery in October of 2019, and came in with multiple falls since recent lumbar decompression and fusion 2/12; Now s/p I&D hematoma; Presents with significant bil LE weakness, decr functional mobility, decr activity tolerance, significantly increased fall risk; I strongly recommend post-acute rehabilitation (will consider CIR versus SNF); Discussed with Meghan, NP;  Patient will benefit from skilled PT to increase their independence and safety with mobility to allow discharge to the venue listed below.       Follow Up Recommendations Other (comment)(Post-acute rehabilitation)    Equipment Recommendations  Rolling walker with 5" wheels;3in1 (PT)    Recommendations for Other Services OT consult(as ordered)     Precautions / Restrictions Precautions Precautions: Back;Fall Precaution Booklet Issued: Yes (comment) Required Braces or Orthoses: Spinal Brace Spinal Brace: Applied in sitting position      Mobility  Bed Mobility Overal bed mobility: Needs Assistance Bed Mobility: Rolling;Sidelying to Sit Rolling: Min guard Sidelying to sit: Mod assist        General bed mobility comments: Cues to slow down - he impulsively moved LEs off of the bed without log rolling; satrted over, and he needed minguard to ensure no twisting; Heavy mod assist to elevate trunk to sit; initial posterior lean once sitting, and needed close guard at EOB as he tended to shift a lot  Transfers Overall transfer level: Needs assistance Equipment used: Rolling walker (2 wheeled) Transfers: Sit to/from [facility] Sit to Stand: Mod assist Stand pivot transfers: Mod assist       General transfer comment: Mod assist to power up from bed; Noted a tendency to brace backs of LEs against the bed for stabiltiy -- indicative of high fall risk; cues for hand placement; Weak LEs, and required heavy mod assist for support with small, pivot steps bed to recliner; heavy dependence on RW; Stood again from recliner with heavy mod (almost max) assist  Ambulation/Gait                Stairs            Wheelchair Mobility    Modified Rankin (Stroke Patients Only)       Balance Overall balance assessment: Needs assistance;History of Falls Sitting-balance support: Bilateral upper extremity supported;Feet supported Sitting balance-Leahy Scale: Poor(approaching Fair) Sitting balance - Comments: initial posterior lean, which he corrected with multimodal cues; Seems fidgety sitting up and requires close guard assist   Standing balance support: Bilateral upper extremity supported;During functional activity Standing balance-Leahy Scale: Poor Standing balance comment: Heavy dependence on UE support on RW                             Pertinent Vitals/Pain Pain Assessment:  Faces Faces Pain Scale: Hurts little more Pain Location: back, incisional pain at end of session Pain Descriptors / Indicators: Aching Pain Intervention(s): Monitored during session;Repositioned    Home Living Family/patient expects to be discharged to:: Private  residence Living Arrangements: Spouse/significant other Available Help at Discharge: Family;Available 24 hours/day Type of Home: House Home Access: Stairs to enter Entrance Stairs-Rails: None Entrance Stairs-Number of Steps: 1 Home Layout: Two level;Able to live on main level with bedroom/bathroom(office and computer in basement. ) Home Equipment: Gilford Rile - 2 wheels;Bedside commode      Prior Function Level of Independence: Independent(prior to initial surgery 10/19)         Comments: PLOF prior to initial sugery in Oct 19:  still driving, enjoys spending time in pool, had trouble dressing prior to sx; since surgery on 2/12, LE weakness, multiple falls     Hand Dominance   Dominant Hand: Right    Extremity/Trunk Assessment   Upper Extremity Assessment Upper Extremity Assessment: Defer to OT evaluation    Lower Extremity Assessment Lower Extremity Assessment: Generalized weakness(functional weakness with the need for significant assist to power up to stand, and for support in upright standing; opted not to MMT given recent surgeries)    Cervical / Trunk Assessment Cervical / Trunk Assessment: (Drain in place)  Communication   Communication: No difficulties;Other (comment)(but noted very distractible)  Cognition Arousal/Alertness: Awake/alert Behavior During Therapy: WFL for tasks assessed/performed;Restless(easily distractible) Overall Cognitive Status: Within Functional Limits for tasks assessed                                 General Comments: WFL for simple mobility; noted quite easily distractible, and needs a quiet environment where he can focus      General Comments      Exercises     Assessment/Plan    PT Assessment Patient needs continued PT services  PT Problem List Decreased strength;Decreased range of motion;Decreased activity tolerance;Decreased balance;Decreased mobility;Decreased coordination;Decreased cognition;Decreased knowledge of  use of DME;Decreased safety awareness;Decreased knowledge of precautions;Pain       PT Treatment Interventions DME instruction;Gait training;Stair training;Functional mobility training;Therapeutic activities;Therapeutic exercise;Balance training;Neuromuscular re-education;Cognitive remediation;Patient/family education    PT Goals (Current goals can be found in the Care Plan section)  Acute Rehab PT Goals Patient Stated Goal: Did not specifically state, thought agreeable to getting out o f the bed PT Goal Formulation: Patient unable to participate in goal setting Time For Goal Achievement: 07/19/18 Potential to Achieve Goals: Good    Frequency Min 4X/week   Barriers to discharge        Co-evaluation               AM-PAC PT "6 Clicks" Mobility  Outcome Measure Help needed turning from your back to your side while in a flat bed without using bedrails?: A Little Help needed moving from lying on your back to sitting on the side of a flat bed without using bedrails?: A Lot Help needed moving to and from a bed to a chair (including a wheelchair)?: A Lot Help needed standing up from a chair using your arms (e.g., wheelchair or bedside chair)?: A Lot Help needed to walk in hospital room?: A Lot Help needed climbing 3-5 steps with a railing? : Total 6 Click Score: 12    End of Session Equipment Utilized During Treatment: Gait belt;Back brace Activity Tolerance: Patient tolerated treatment well Patient left: in chair;with call bell/phone  within reach;with chair alarm set Nurse Communication: Mobility status PT Visit Diagnosis: Unsteadiness on feet (R26.81);Other abnormalities of gait and mobility (R26.89);Muscle weakness (generalized) (M62.81);Difficulty in walking, not elsewhere classified (R26.2)    Time: 9359-4090 PT Time Calculation (min) (ACUTE ONLY): 36 min   Charges:   PT Evaluation $PT Eval Moderate Complexity: 1 Mod PT Treatments $Therapeutic Activity: 8-22 mins         Roney Marion, PT  Acute Rehabilitation Services Pager [phone] Office [place] 07/05/2018, 12:13 PM

## 2018-07-05 NOTE — Progress Notes (Signed)
Pt hemavac removed as ordered; clean, sterile dsg applied to site; OOB to BR with assistance; pt voided x1. P. Angelica Pou RN

## 2018-07-06 LAB — URINALYSIS, ROUTINE W REFLEX MICROSCOPIC
Bilirubin Urine: NEGATIVE
Glucose, UA: NEGATIVE mg/dL
Hgb urine dipstick: NEGATIVE
Ketones, ur: NEGATIVE mg/dL
Leukocytes,Ua: NEGATIVE
Nitrite: NEGATIVE
Protein, ur: NEGATIVE mg/dL
Specific Gravity, Urine: 1.016 (ref 1.005–1.030)
pH: 6 (ref 5.0–8.0)

## 2018-07-06 NOTE — Progress Notes (Signed)
Patient refuse NIV QHS for the night.

## 2018-07-06 NOTE — Progress Notes (Signed)
Physical Therapy Treatment Patient Details Name: William [name] MRN: [id number] DOB: 20-Dec-1950 Today's Date: 07/06/2018    History of Present Illness The patient is a 68 year old white male on whom Dr. Christella Noa performed an L4-5 decompression, instrumentation and fusion on 2/12 at the [facility] specialist surgery center.  The patient initially did well and was discharged. After discharge, leg weakness onset, and he had 4-5 falls and urinary retention as well as fever; Now s/p Incision and drainage of wound and evacuation of epidural hematoma;  has a past medical history of Arthritis, GERD (gastroesophageal reflux disease), Hypertension, and Sleep apnea.    PT Comments    Continuing work on functional mobility and activity tolerance;  Came into room because chair alarm was sounding, and noted Mr. Wernick was in poor position in the recliner, so opted to assist him back to bed; Still with posterior lean initially, corrects with cues; less stable with steps to bed, and required +2 assist for sfaety   Follow Up Recommendations  Other (comment)(Post-acute rehabilitation)     Equipment Recommendations  Rolling walker with 5" wheels;3in1 (PT)    Recommendations for Other Services       Precautions / Restrictions Precautions Precautions: Back;Fall Required Braces or Orthoses: Spinal Brace Spinal Brace: Applied in sitting position    Mobility  Bed Mobility Overal bed mobility: Needs Assistance Bed Mobility: Sit to Sidelying;Rolling Rolling: Min guard       Sit to sidelying: Mod assist General bed mobility comments: Cues for techqniue, and assist to help bil LEs back into bed  Transfers Overall transfer level: Needs assistance Equipment used: Rolling walker (2 wheeled) Transfers: Sit to/from Stand Sit to Stand: Mod assist Stand pivot transfers: Mod assist       General transfer comment: Mod assist to power up from recliner; Noted a tendency to brace backs of LEs against  surfaces for stabiltiy -- indicative of high fall risk; cues for hand placement; Weak LEs, and required heavy mod assist for support with small, pivot steps back to bed; heavy dependence on RW  Ambulation/Gait                 Stairs             Wheelchair Mobility    Modified Rankin (Stroke Patients Only)       Balance     Sitting balance-Leahy Scale: Poor(approaching Fair) Sitting balance - Comments: Heavy lean back in recliner; able to pull from supported sitting to unsupported sitting in recliner to don back brace; cues for using armrests to pull forward   Standing balance support: Bilateral upper extremity supported;During functional activity Standing balance-Leahy Scale: Poor Standing balance comment: Heavy dependence on UE support on RW                            Cognition Arousal/Alertness: Lethargic(Arousable) Behavior During Therapy: WFL for tasks assessed/performed;Flat affect Overall Cognitive Status: Impaired/Different from baseline Area of Impairment: Attention;Safety/judgement;Problem solving                     Memory: Decreased recall of precautions;Decreased short-term memory Following Commands: Follows one step commands with increased time Safety/Judgement: Decreased awareness of safety;Decreased awareness of deficits   Problem Solving: Difficulty sequencing;Requires verbal cues;Requires tactile cues General Comments: Sleepy and distracible throughout session; much less interactive than yesterday, minimal talking, but does follow commands with incr time      Exercises  General Comments        Pertinent Vitals/Pain Pain Assessment: Faces Faces Pain Scale: Hurts a little bit Pain Location: Grimace with mvoementq Pain Descriptors / Indicators: Grimacing Pain Intervention(s): Monitored during session    Home Living                      Prior Function            PT Goals (current goals can now be  found in the care plan section) Acute Rehab PT Goals Patient Stated Goal: Did not state today PT Goal Formulation: Patient unable to participate in goal setting Time For Goal Achievement: 07/19/18 Potential to Achieve Goals: Good Progress towards PT goals: Progressing toward goals(Slowly)    Frequency    Min 4X/week      PT Plan Current plan remains appropriate    Co-evaluation              AM-PAC PT "6 Clicks" Mobility   Outcome Measure  Help needed turning from your back to your side while in a flat bed without using bedrails?: A Little Help needed moving from lying on your back to sitting on the side of a flat bed without using bedrails?: A Lot Help needed moving to and from a bed to a chair (including a wheelchair)?: A Lot Help needed standing up from a chair using your arms (e.g., wheelchair or bedside chair)?: A Lot Help needed to walk in hospital room?: A Lot Help needed climbing 3-5 steps with a railing? : Total 6 Click Score: 12    End of Session Equipment Utilized During Treatment: Back brace Activity Tolerance: Patient tolerated treatment well Patient left: in bed;with call bell/phone within reach;with bed alarm set Nurse Communication: Mobility status PT Visit Diagnosis: Unsteadiness on feet (R26.81);Other abnormalities of gait and mobility (R26.89);Muscle weakness (generalized) (M62.81);Difficulty in walking, not elsewhere classified (R26.2)     Time: 1000-1010 PT Time Calculation (min) (ACUTE ONLY): 10 min  Charges:  $Therapeutic Activity: 8-22 mins                     Roney Marion, PT  Acute Rehabilitation Services Pager [phone] Office [place] 07/06/2018, 2:48 PM

## 2018-07-06 NOTE — Progress Notes (Signed)
Pt is lethargic and confusion changed from A+Ox3 to A+Ox2. VSS and no change in assessment. Pt daughter is present and very concerned about pt mental status. MD paged.

## 2018-07-06 NOTE — Progress Notes (Signed)
Inpatient Rehabilitation Admissions Coordinator  Inpatient Acute Rehab consult received. I met with patient at bedside to begin assessment of his rehab needs. Patient very sleepy at this time and falling asleep during my assessment. I will follow up today once patient more alert to begin discussions concerning his rehab needs.  Danne Baxter, RN, MSN Rehab Admissions Coordinator [phone] 07/06/2018 10:55 AM

## 2018-07-06 NOTE — Progress Notes (Signed)
Subjective: The patient is alert and pleasant.  He is in no apparent distress.  He continues to have difficulty walking because he feels he is weak.  He has been urinating with a bedside urinal.  Objective: Vital signs in last 24 hours: Temp:  [98.3 F (36.8 C)-98.4 F (36.9 C)] 98.4 F (36.9 C) (02/18 0426) Pulse Rate:  [80-92] 80 (02/18 0426) Resp:  [14-16] 14 (02/18 0426) BP: (111-156)/(71-85) 156/74 (02/18 0426) SpO2:  [94 %-98 %] 97 % (02/18 0426) Estimated body mass index is 29.98 kg/m as calculated from the following:   Height as of 02/24/18: 5\' 11"  (1.803 m).   Weight as of 02/24/18: 97.5 kg.   Intake/Output from previous day: 02/17 0701 - 02/18 0700 In: 803.6 [P.O.:780; I.V.:23.6] Out: 1720 [Urine:1700; Drains:20] Intake/Output this shift: No intake/output data recorded.  Physical exam the patient is alert and oriented.  His dressing has a small bloodstain.  His strength is grossly normal in his bilateral lower extremities.  Lab Results: Recent Labs    07/04/18 1043 07/05/18 0221  WBC  --  10.1  HGB 11.6* 11.1*  HCT 34.0* 32.9*  PLT  --  186   BMET Recent Labs    07/04/18 1043 07/05/18 0221  NA 137 137  K 3.9 4.1  CL  --  100  CO2  --  27  GLUCOSE 182* 181*  BUN  --  20  CREATININE  --  1.02  CALCIUM  --  9.0    Studies/Results: No results found.  Assessment/Plan: Postop day #2: I encouraged the patient to mobilize with PT, OT.  I will ask rehab to see the patient.  LOS: 1 day     [name] 07/06/2018, 8:00 AM

## 2018-07-06 NOTE — Progress Notes (Signed)
Physical Therapy Treatment Patient Details Name: William [name] MRN: [id number] DOB: 1950/12/03 Today's Date: 07/06/2018    History of Present Illness The patient is a 68 year old white male on whom Dr. Christella Noa performed an L4-5 decompression, instrumentation and fusion on 2/12 at the [facility] specialist surgery center.  The patient initially did well and was discharged. After discharge, leg weakness onset, and he had 4-5 falls and urinary retention as well as fever; Now s/p Incision and drainage of wound and evacuation of epidural hematoma;  has a past medical history of Arthritis, GERD (gastroesophageal reflux disease), Hypertension, and Sleep apnea.    PT Comments    Continuing work on functional mobility and activity tolerance;  Pt reclined in recliner upon arrival, brace not on; Very sleepy throughout session, but still following commands; Assisted pt with donning brace in unsupported sitting in chair; Able to work on sit to stand transfers, still with initiat posterior bias, but able to correct with multimodal cues; Put new linen in recliner chair and positioned pt comforatbly in recliner, chair alarm set  Follow Up Recommendations  Other (comment)(Post-acute rehabilitation)     Equipment Recommendations  Rolling walker with 5" wheels;3in1 (PT)    Recommendations for Other Services       Precautions / Restrictions Precautions Precautions: Back;Fall Required Braces or Orthoses: Spinal Brace Spinal Brace: Applied in sitting position Restrictions Weight Bearing Restrictions: No    Mobility  Bed Mobility                  Transfers Overall transfer level: Needs assistance Equipment used: Rolling walker (2 wheeled) Transfers: Sit to/from Stand Sit to Stand: Mod assist         General transfer comment: Mod assist to power up from recliner; Noted a tendency to brace backs of LEs against surfaces for stabiltiy -- indicative of high fall risk; cues for hand  placement; Initially with heavy posterior lean in standing, able to correct with multimodal cues; heavy dependence on RW  Ambulation/Gait                 Stairs             Wheelchair Mobility    Modified Rankin (Stroke Patients Only)       Balance     Sitting balance-Leahy Scale: Poor(approaching Fair) Sitting balance - Comments: Heavy lean back in recliner; able to pull from supported sitting to unsupported sitting in recliner to don back brace; cues for using armrests to pull forward   Standing balance support: Bilateral upper extremity supported;During functional activity Standing balance-Leahy Scale: Poor Standing balance comment: Heavy dependence on UE support on RW                            Cognition Arousal/Alertness: Lethargic(Arousable) Behavior During Therapy: WFL for tasks assessed/performed;Flat affect Overall Cognitive Status: Impaired/Different from baseline Area of Impairment: Attention;Safety/judgement;Problem solving                       Following Commands: Follows one step commands with increased time Safety/Judgement: Decreased awareness of safety;Decreased awareness of deficits   Problem Solving: Difficulty sequencing;Requires verbal cues;Requires tactile cues General Comments: Sleepy and distracible throughout session; much less interactive than yesterday, minimal talking, but dies follow commands with incr time      Exercises      General Comments        Pertinent Vitals/Pain Pain Assessment: Faces Faces  Pain Scale: Hurts a little bit Pain Location: Grimace with mvoementq Pain Descriptors / Indicators: Grimacing Pain Intervention(s): Monitored during session;Repositioned    Home Living                      Prior Function            PT Goals (current goals can now be found in the care plan section) Acute Rehab PT Goals Patient Stated Goal: Did not state today PT Goal Formulation: Patient  unable to participate in goal setting Time For Goal Achievement: 07/19/18 Potential to Achieve Goals: Good Progress towards PT goals: Not progressing toward goals - comment(more sleepy and less participative)    Frequency    Min 4X/week      PT Plan Current plan remains appropriate    Co-evaluation              AM-PAC PT "6 Clicks" Mobility   Outcome Measure  Help needed turning from your back to your side while in a flat bed without using bedrails?: A Little Help needed moving from lying on your back to sitting on the side of a flat bed without using bedrails?: A Lot Help needed moving to and from a bed to a chair (including a wheelchair)?: A Lot Help needed standing up from a chair using your arms (e.g., wheelchair or bedside chair)?: A Lot Help needed to walk in hospital room?: A Lot Help needed climbing 3-5 steps with a railing? : Total 6 Click Score: 12    End of Session Equipment Utilized During Treatment: Back brace Activity Tolerance: Patient tolerated treatment well Patient left: in chair;with call bell/phone within reach;with chair alarm set(reclined) Nurse Communication: Mobility status PT Visit Diagnosis: Unsteadiness on feet (R26.81);Other abnormalities of gait and mobility (R26.89);Muscle weakness (generalized) (M62.81);Difficulty in walking, not elsewhere classified (R26.2)     Time: 8938-1017 PT Time Calculation (min) (ACUTE ONLY): 12 min  Charges:  $Therapeutic Activity: 8-22 mins                     Roney Marion, PT  Acute Rehabilitation Services Pager [phone] Office [place] 07/06/2018, 12:01 PM

## 2018-07-06 NOTE — Progress Notes (Signed)
Inpatient Rehabilitation Admissions Coordinator  I met with patient with his daughter, Cecille Rubin, at bedside. Patient awake and alert. Daughter feels patient will need some more therapy prior to d/c home. Typically he is very independent and active. I will follow up over the next 24 to 48 hrs with his progress for a possible inpt rehab admit if he fails to progress to d/c directly home. Daughter to clarify pt's insurance with pt's girlfriend, Otila Kluver to assist with planning dispo.  Danne Baxter, RN, MSN Rehab Admissions Coordinator [phone] 07/06/2018 2:45 PM

## 2018-07-06 NOTE — Plan of Care (Signed)
  Problem: Health Behavior/Discharge Planning: Goal: Ability to manage health-related needs will improve Outcome: Not Progressing   

## 2018-07-07 NOTE — Progress Notes (Signed)
Physical Therapy Treatment Patient Details Name: William [name] MRN: [id number] DOB: Jun 28, 1950 Today's Date: 07/07/2018    History of Present Illness The patient is a 68 year old white male on whom Dr. Christella Noa performed an L4-5 decompression, instrumentation and fusion on 2/12 at the [facility] specialist surgery center.  The patient initially did well and was discharged. After discharge, leg weakness onset, and he had 4-5 falls and urinary retention as well as fever; Now s/p Incision and drainage of wound and evacuation of epidural hematoma;  has a past medical history of Arthritis, GERD (gastroesophageal reflux disease), Hypertension, and Sleep apnea.    PT Comments    Continuing work on functional mobility and activity tolerance; Noting improvements in activity tolerance, arousal over last session; Able to walk a short distance -- small short steps, resembling festination, with wide BOS/step width;   I'm concerned about his mentation; he was independent before surgeries, and now has attention, memory, problem-solving deficits; Is is worth considering imaging his head?   Follow Up Recommendations  Other (comment)(Post-acute rehabilitation)     Equipment Recommendations  Rolling walker with 5" wheels;3in1 (PT)    Recommendations for Other Services       Precautions / Restrictions Precautions Precautions: Back;Fall Precaution Booklet Issued: Yes (comment) Required Braces or Orthoses: Spinal Brace Spinal Brace: Applied in sitting position    Mobility  Bed Mobility Overal bed mobility: Needs Assistance Bed Mobility: Rolling;Sidelying to Sit Rolling: Min guard Sidelying to sit: Mod assist       General bed mobility comments: Cues to slow down - he impulsively moved LEs off of the bed without log rolling; satrted over, and he needed minguard to ensure no twisting; Heavy mod assist to elevate trunk to sit; initial posterior lean once sitting, and needed close guard at EOB as  he tended to shift a lot  Transfers Overall transfer level: Needs assistance Equipment used: Rolling walker (2 wheeled) Transfers: Sit to/from Stand Sit to Stand: Mod assist         General transfer comment: Mod assist to power up from recliner; Noted a tendency to brace backs of LEs against surfaces for stabiltiy -- indicative of high fall risk; cues for hand placement  Ambulation/Gait Ambulation/Gait assistance: Mod assist;+2 physical assistance;+2 safety/equipment Gait Distance (Feet): 5 Feet Assistive device: Rolling walker (2 wheeled) Gait Pattern/deviations: Wide base of support;Festinating;Decreased step length - right;Decreased step length - left;Decreased stance time - right;Decreased stance time - left     General Gait Details: Small, short, almost festinating steps with very wide BOS; worked on marching in place as well; cued to high-step, which he began to do, but then that extinhuished to not lifting feet off of the floor   Stairs             Wheelchair Mobility    Modified Rankin (Stroke Patients Only)       Balance     Sitting balance-Leahy Scale: Poor       Standing balance-Leahy Scale: Poor Standing balance comment: Heavy dependence on UE support on RW                            Cognition Arousal/Alertness: Awake/alert Behavior During Therapy: WFL for tasks assessed/performed Overall Cognitive Status: Impaired/Different from baseline Area of Impairment: Attention;Safety/judgement;Problem solving;Memory                     Memory: Decreased recall of precautions;Decreased short-term memory Following Commands:  Follows one step commands with increased time Safety/Judgement: Decreased awareness of safety;Decreased awareness of deficits   Problem Solving: Difficulty sequencing;Requires verbal cues;Requires tactile cues General Comments: More awake than last session; At end of session, he was unable to answer, "what was easy  with what we just did?" and 'what was hard with what we just did?"      Exercises      General Comments        Pertinent Vitals/Pain Pain Assessment: Faces Faces Pain Scale: Hurts a little bit Pain Location: Grimace with mvoementq Pain Descriptors / Indicators: Grimacing Pain Intervention(s): Monitored during session    Home Living                      Prior Function            PT Goals (current goals can now be found in the care plan section) Acute Rehab PT Goals Patient Stated Goal: Did not state today PT Goal Formulation: Patient unable to participate in goal setting Time For Goal Achievement: 07/19/18 Potential to Achieve Goals: Good Progress towards PT goals: Progressing toward goals(slowly)    Frequency    Min 4X/week      PT Plan Current plan remains appropriate    Co-evaluation              AM-PAC PT "6 Clicks" Mobility   Outcome Measure  Help needed turning from your back to your side while in a flat bed without using bedrails?: A Little Help needed moving from lying on your back to sitting on the side of a flat bed without using bedrails?: A Lot Help needed moving to and from a bed to a chair (including a wheelchair)?: A Lot Help needed standing up from a chair using your arms (e.g., wheelchair or bedside chair)?: A Lot Help needed to walk in hospital room?: A Lot Help needed climbing 3-5 steps with a railing? : Total 6 Click Score: 12    End of Session Equipment Utilized During Treatment: Back brace Activity Tolerance: Patient tolerated treatment well Patient left: in bed;with call bell/phone within reach;with bed alarm set Nurse Communication: Mobility status PT Visit Diagnosis: Unsteadiness on feet (R26.81);Other abnormalities of gait and mobility (R26.89);Muscle weakness (generalized) (M62.81);Difficulty in walking, not elsewhere classified (R26.2)     Time: 0277-4128 PT Time Calculation (min) (ACUTE ONLY): 22  min  Charges:  $Gait Training: 8-22 mins                     Roney Marion, [place]  Acute Rehabilitation Services Pager [phone] Office [place] 07/07/2018, 4:48 PM

## 2018-07-07 NOTE — Progress Notes (Signed)
Pt. Refused cpap for tonight. 

## 2018-07-07 NOTE — Progress Notes (Signed)
   Providing Compassionate, Quality Care - Together   Subjective: Patient reports mild back pain. He appears lethargic on assessment. Nursing staff report confusion overnight. Only Tylenol given overnight for pain.  Objective: Vital signs in last 24 hours: Temp:  [98.3 F (36.8 C)-98.9 F (37.2 C)] 98.9 F (37.2 C) (02/19 1252) Pulse Rate:  [81-98] 95 (02/19 1252) Resp:  [14-16] 16 (02/19 1252) BP: (124-169)/(58-74) 154/64 (02/19 1252) SpO2:  [93 %-95 %] 93 % (02/19 1252)  Intake/Output from previous day: 02/18 0701 - 02/19 0700 In: 480 [P.O.:480] Out: 1450 [Urine:1450] Intake/Output this shift: Total I/O In: 363 [P.O.:360; I.V.:3] Out: -   Responds to voice, lethargic Oriented to person, place, and time, but not situation MAE, Strength 5/5 BUE, BLE Honeycomb dressing with small amount of dried blood   Lab Results: Recent Labs    07/05/18 0221  WBC 10.1  HGB 11.1*  HCT 32.9*  PLT 186   BMET Recent Labs    07/05/18 0221  NA 137  K 4.1  CL 100  CO2 27  GLUCOSE 181*  BUN 20  CREATININE 1.02  CALCIUM 9.0    Assessment/Plan: William [name] underwent an L4-5 decompression, instrumentation and fusion by Dr. Christella Noa on 2/12 at the [facility] specialty surgery center.  The patient did well initially and was discharged home. After discharge, he developed lower extremity weakness. He had 4-5 falls, urinary retention and a fever. He underwent an incision and drainage of his wound and an evacuation of epidural hematoma by Dr. Arnoldo Morale. He is able to void on his own. There is minimal post void residual.  Urinalysis from 07/06/2018 was negative. He developed mild confusion throughout the day on 07/06/2018. Nursing staff and daughter believe this is medication related. Pain medication was scaled back and William [name] is slowly returning to his baseline.    LOS: 2 days    -Continue to mobilize with therapies; possible admission to CIR at discharge -Continue current pain management  plan. Notify Neurosurgery if patient's neuro status doesn't continue to improve   Viona Gilmore, DNP, AGNP-C Nurse Practitioner  [facility] [place]. [address] [place], [place], [place] [address] P: [phone]    F: [phone]  07/07/2018, 9:00 AM

## 2018-07-07 NOTE — Plan of Care (Signed)
  Problem: Health Behavior/Discharge Planning: Goal: Ability to manage health-related needs will improve Outcome: Progressing   

## 2018-07-08 ENCOUNTER — Other Ambulatory Visit: Payer: Self-pay

## 2018-07-08 ENCOUNTER — Inpatient Hospital Stay (HOSPITAL_COMMUNITY)
Admission: RE | Admit: 2018-07-08 | Discharge: 2018-07-23 | DRG: 945 | Disposition: A | Discharge status: Skilled Nursing Facility | Payer: Medicare Other | Source: Intra-hospital | Attending: Physical Medicine & Rehabilitation | Admitting: Physical Medicine & Rehabilitation

## 2018-07-08 ENCOUNTER — Encounter (HOSPITAL_COMMUNITY): Payer: Self-pay | Admitting: *Deleted

## 2018-07-08 ENCOUNTER — Encounter (HOSPITAL_COMMUNITY): Payer: Self-pay | Admitting: Physical Medicine and Rehabilitation

## 2018-07-08 DIAGNOSIS — Z96659 Presence of unspecified artificial knee joint: Secondary | ICD-10-CM

## 2018-07-08 DIAGNOSIS — R740 Nonspecific elevation of levels of transaminase and lactic acid dehydrogenase [LDH]: Secondary | ICD-10-CM

## 2018-07-08 DIAGNOSIS — R5381 Other malaise: Secondary | ICD-10-CM | POA: Diagnosis present

## 2018-07-08 DIAGNOSIS — N401 Enlarged prostate with lower urinary tract symptoms: Secondary | ICD-10-CM

## 2018-07-08 DIAGNOSIS — Z8551 Personal history of malignant neoplasm of bladder: Secondary | ICD-10-CM | POA: Diagnosis not present

## 2018-07-08 DIAGNOSIS — F05 Delirium due to known physiological condition: Secondary | ICD-10-CM | POA: Diagnosis present

## 2018-07-08 DIAGNOSIS — Z882 Allergy status to sulfonamides status: Secondary | ICD-10-CM

## 2018-07-08 DIAGNOSIS — M48061 Spinal stenosis, lumbar region without neurogenic claudication: Secondary | ICD-10-CM | POA: Diagnosis not present

## 2018-07-08 DIAGNOSIS — Z87891 Personal history of nicotine dependence: Secondary | ICD-10-CM | POA: Diagnosis not present

## 2018-07-08 DIAGNOSIS — M109 Gout, unspecified: Secondary | ICD-10-CM | POA: Diagnosis present

## 2018-07-08 DIAGNOSIS — Z7982 Long term (current) use of aspirin: Secondary | ICD-10-CM

## 2018-07-08 DIAGNOSIS — E871 Hypo-osmolality and hyponatremia: Secondary | ICD-10-CM | POA: Diagnosis not present

## 2018-07-08 DIAGNOSIS — G9340 Encephalopathy, unspecified: Secondary | ICD-10-CM | POA: Diagnosis present

## 2018-07-08 DIAGNOSIS — Z8052 Family history of malignant neoplasm of bladder: Secondary | ICD-10-CM | POA: Diagnosis not present

## 2018-07-08 DIAGNOSIS — E46 Unspecified protein-calorie malnutrition: Secondary | ICD-10-CM | POA: Diagnosis present

## 2018-07-08 DIAGNOSIS — R0989 Other specified symptoms and signs involving the circulatory and respiratory systems: Secondary | ICD-10-CM

## 2018-07-08 DIAGNOSIS — M1711 Unilateral primary osteoarthritis, right knee: Secondary | ICD-10-CM | POA: Diagnosis present

## 2018-07-08 DIAGNOSIS — I10 Essential (primary) hypertension: Secondary | ICD-10-CM | POA: Diagnosis present

## 2018-07-08 DIAGNOSIS — E8809 Other disorders of plasma-protein metabolism, not elsewhere classified: Secondary | ICD-10-CM

## 2018-07-08 DIAGNOSIS — D62 Acute posthemorrhagic anemia: Secondary | ICD-10-CM | POA: Diagnosis present

## 2018-07-08 DIAGNOSIS — S064X9A Epidural hemorrhage with loss of consciousness of unspecified duration, initial encounter: Secondary | ICD-10-CM | POA: Diagnosis not present

## 2018-07-08 DIAGNOSIS — M5416 Radiculopathy, lumbar region: Secondary | ICD-10-CM

## 2018-07-08 DIAGNOSIS — S064XAA Epidural hemorrhage with loss of consciousness status unknown, initial encounter: Secondary | ICD-10-CM | POA: Diagnosis present

## 2018-07-08 DIAGNOSIS — Z6827 Body mass index (BMI) 27.0-27.9, adult: Secondary | ICD-10-CM

## 2018-07-08 DIAGNOSIS — Z8249 Family history of ischemic heart disease and other diseases of the circulatory system: Secondary | ICD-10-CM

## 2018-07-08 DIAGNOSIS — R410 Disorientation, unspecified: Secondary | ICD-10-CM | POA: Diagnosis not present

## 2018-07-08 DIAGNOSIS — K5901 Slow transit constipation: Secondary | ICD-10-CM

## 2018-07-08 DIAGNOSIS — E119 Type 2 diabetes mellitus without complications: Secondary | ICD-10-CM

## 2018-07-08 DIAGNOSIS — R739 Hyperglycemia, unspecified: Secondary | ICD-10-CM | POA: Diagnosis not present

## 2018-07-08 DIAGNOSIS — Z791 Long term (current) use of non-steroidal anti-inflammatories (NSAID): Secondary | ICD-10-CM | POA: Diagnosis not present

## 2018-07-08 DIAGNOSIS — G4730 Sleep apnea, unspecified: Secondary | ICD-10-CM | POA: Diagnosis present

## 2018-07-08 DIAGNOSIS — M48062 Spinal stenosis, lumbar region with neurogenic claudication: Secondary | ICD-10-CM | POA: Diagnosis present

## 2018-07-08 DIAGNOSIS — K219 Gastro-esophageal reflux disease without esophagitis: Secondary | ICD-10-CM | POA: Diagnosis present

## 2018-07-08 DIAGNOSIS — R7401 Elevation of levels of liver transaminase levels: Secondary | ICD-10-CM

## 2018-07-08 DIAGNOSIS — N390 Urinary tract infection, site not specified: Secondary | ICD-10-CM

## 2018-07-08 DIAGNOSIS — N39498 Other specified urinary incontinence: Secondary | ICD-10-CM | POA: Diagnosis present

## 2018-07-08 DIAGNOSIS — R338 Other retention of urine: Secondary | ICD-10-CM | POA: Diagnosis present

## 2018-07-08 LAB — URINALYSIS, ROUTINE W REFLEX MICROSCOPIC
BILIRUBIN URINE: NEGATIVE
GLUCOSE, UA: NEGATIVE mg/dL
HGB URINE DIPSTICK: NEGATIVE
Ketones, ur: NEGATIVE mg/dL
Leukocytes,Ua: NEGATIVE
Nitrite: NEGATIVE
Protein, ur: NEGATIVE mg/dL
Specific Gravity, Urine: 1.016 (ref 1.005–1.030)
pH: 6 (ref 5.0–8.0)

## 2018-07-08 MED ORDER — DOCUSATE SODIUM 100 MG PO CAPS: 100.0000 mg | ORAL_CAPSULE | Freq: Two times a day (BID) | ORAL | 0 refills | Status: DC

## 2018-07-08 MED ORDER — LORATADINE 10 MG PO TABS
10.0000 mg | ORAL_TABLET | Freq: Every day | ORAL | Status: DC
  Administered 2018-07-09 – 2018-07-23 (×15): 10 mg via ORAL
  Filled 2018-07-08 (×15): qty 1

## 2018-07-08 MED ORDER — PROCHLORPERAZINE 25 MG RE SUPP: 12.5000 mg | Freq: Four times a day (QID) | RECTAL | Status: DC | PRN

## 2018-07-08 MED ORDER — METOPROLOL SUCCINATE ER 50 MG PO TB24
100.0000 mg | ORAL_TABLET | Freq: Every day | ORAL | Status: DC
  Administered 2018-07-09 – 2018-07-23 (×15): 100 mg via ORAL
  Filled 2018-07-08 (×15): qty 2

## 2018-07-08 MED ORDER — LOSARTAN POTASSIUM 50 MG PO TABS
100.0000 mg | ORAL_TABLET | Freq: Every day | ORAL | Status: DC
  Administered 2018-07-09 – 2018-07-23 (×15): 100 mg via ORAL
  Filled 2018-07-08 (×15): qty 2

## 2018-07-08 MED ORDER — MENTHOL 3 MG MT LOZG: 1.0000 | LOZENGE | OROMUCOSAL | Status: DC | PRN

## 2018-07-08 MED ORDER — LIDOCAINE HCL URETHRAL/MUCOSAL 2 % EX GEL
CUTANEOUS | Status: DC | PRN
  Filled 2018-07-08: qty 5

## 2018-07-08 MED ORDER — HYDROCHLOROTHIAZIDE 12.5 MG PO CAPS
12.5000 mg | ORAL_CAPSULE | Freq: Every day | ORAL | Status: DC
  Administered 2018-07-09 – 2018-07-23 (×15): 12.5 mg via ORAL
  Filled 2018-07-08 (×15): qty 1

## 2018-07-08 MED ORDER — ALUM & MAG HYDROXIDE-SIMETH 200-200-20 MG/5ML PO SUSP: 30.0000 mL | ORAL | Status: DC | PRN

## 2018-07-08 MED ORDER — TAMSULOSIN HCL 0.4 MG PO CAPS
0.4000 mg | ORAL_CAPSULE | Freq: Two times a day (BID) | ORAL | Status: DC
  Administered 2018-07-08 – 2018-07-23 (×30): 0.4 mg via ORAL
  Filled 2018-07-08 (×30): qty 1

## 2018-07-08 MED ORDER — HYDROCODONE-ACETAMINOPHEN 7.5-325 MG PO TABS
1.0000 | ORAL_TABLET | ORAL | Status: DC | PRN
  Administered 2018-07-11 – 2018-07-13 (×4): 1 via ORAL
  Filled 2018-07-08 (×4): qty 1

## 2018-07-08 MED ORDER — ATORVASTATIN CALCIUM 10 MG PO TABS
10.0000 mg | ORAL_TABLET | Freq: Every day | ORAL | Status: DC
  Administered 2018-07-08 – 2018-07-22 (×15): 10 mg via ORAL
  Filled 2018-07-08 (×15): qty 1

## 2018-07-08 MED ORDER — ACETAMINOPHEN 325 MG PO TABS
325.0000 mg | ORAL_TABLET | ORAL | Status: DC | PRN
  Administered 2018-07-08 – 2018-07-18 (×4): 650 mg via ORAL
  Filled 2018-07-08 (×6): qty 2

## 2018-07-08 MED ORDER — POLYETHYLENE GLYCOL 3350 17 G PO PACK
17.0000 g | PACK | Freq: Every day | ORAL | Status: DC | PRN
  Administered 2018-07-12 – 2018-07-20 (×3): 17 g via ORAL
  Filled 2018-07-08 (×3): qty 1

## 2018-07-08 MED ORDER — POLYVINYL ALCOHOL 1.4 % OP SOLN: 2.0000 [drp] | Freq: Three times a day (TID) | OPHTHALMIC | Status: DC | PRN

## 2018-07-08 MED ORDER — ALLOPURINOL 100 MG PO TABS
100.0000 mg | ORAL_TABLET | Freq: Every day | ORAL | Status: DC
  Administered 2018-07-09 – 2018-07-23 (×15): 100 mg via ORAL
  Filled 2018-07-08 (×15): qty 1

## 2018-07-08 MED ORDER — HYDROCODONE-ACETAMINOPHEN 7.5-325 MG PO TABS: 1.0000 | ORAL_TABLET | ORAL | 0 refills | Status: DC | PRN

## 2018-07-08 MED ORDER — AMLODIPINE BESYLATE 5 MG PO TABS
5.0000 mg | ORAL_TABLET | Freq: Every day | ORAL | Status: DC
  Administered 2018-07-09 – 2018-07-23 (×15): 5 mg via ORAL
  Filled 2018-07-08 (×15): qty 1

## 2018-07-08 MED ORDER — BISACODYL 10 MG RE SUPP
10.0000 mg | Freq: Every day | RECTAL | Status: DC | PRN
  Administered 2018-07-14: 10 mg via RECTAL
  Filled 2018-07-08: qty 1

## 2018-07-08 MED ORDER — MAGNESIUM OXIDE 400 (241.3 MG) MG PO TABS
400.0000 mg | ORAL_TABLET | Freq: Two times a day (BID) | ORAL | Status: DC
  Administered 2018-07-08 – 2018-07-23 (×30): 400 mg via ORAL
  Filled 2018-07-08 (×30): qty 1

## 2018-07-08 MED ORDER — ACETAMINOPHEN 325 MG PO TABS: 650.0000 mg | ORAL_TABLET | ORAL | Status: DC | PRN

## 2018-07-08 MED ORDER — PROCHLORPERAZINE MALEATE 5 MG PO TABS: 5.0000 mg | ORAL_TABLET | Freq: Four times a day (QID) | ORAL | Status: DC | PRN

## 2018-07-08 MED ORDER — TRAMADOL HCL 50 MG PO TABS
50.0000 mg | ORAL_TABLET | Freq: Four times a day (QID) | ORAL | Status: DC | PRN
  Administered 2018-07-09 – 2018-07-12 (×10): 50 mg via ORAL
  Filled 2018-07-08 (×12): qty 1

## 2018-07-08 MED ORDER — PHENOL 1.4 % MT LIQD: 1.0000 | OROMUCOSAL | Status: DC | PRN

## 2018-07-08 MED ORDER — FLEET ENEMA 7-19 GM/118ML RE ENEM: 1.0000 | ENEMA | Freq: Once | RECTAL | Status: DC | PRN

## 2018-07-08 MED ORDER — PROCHLORPERAZINE EDISYLATE 10 MG/2ML IJ SOLN: 5.0000 mg | Freq: Four times a day (QID) | INTRAMUSCULAR | Status: DC | PRN

## 2018-07-08 MED ORDER — DIPHENHYDRAMINE HCL 12.5 MG/5ML PO ELIX
12.5000 mg | ORAL_SOLUTION | Freq: Four times a day (QID) | ORAL | Status: DC | PRN
  Filled 2018-07-08: qty 10

## 2018-07-08 MED ORDER — TRAZODONE HCL 50 MG PO TABS
25.0000 mg | ORAL_TABLET | Freq: Every evening | ORAL | Status: DC | PRN
  Administered 2018-07-09 – 2018-07-14 (×5): 50 mg via ORAL
  Filled 2018-07-08 (×5): qty 1

## 2018-07-08 MED ORDER — PANTOPRAZOLE SODIUM 40 MG PO TBEC
40.0000 mg | DELAYED_RELEASE_TABLET | Freq: Every day | ORAL | Status: DC
  Administered 2018-07-09 – 2018-07-23 (×15): 40 mg via ORAL
  Filled 2018-07-08 (×15): qty 1

## 2018-07-08 MED ORDER — DOCUSATE SODIUM 100 MG PO CAPS
100.0000 mg | ORAL_CAPSULE | Freq: Two times a day (BID) | ORAL | Status: DC
  Administered 2018-07-08 – 2018-07-23 (×29): 100 mg via ORAL
  Filled 2018-07-08 (×30): qty 1

## 2018-07-08 NOTE — Care Management Important Message (Signed)
Important Message  Patient Details  Name: William [name] MRN: [id number] Date of Birth: 1951/03/02   Medicare Important Message Given:  Yes    Orbie Pyo 07/08/2018, 2:18 PM

## 2018-07-08 NOTE — H&P (Signed)
[facility] Admission H&P        Chief Complaint  Patient presents with  . Debility      HPI: William [name] is a 68 year old male with history of HTN, bladder cancer,  BPH, lumbar stenosis with radiculopathy s/p decompression 02/2018 and recent surgery few days PTA to [facility] on 07/05/18 with multiple falls, fever, blood drainage from the wound and urinary retention.  He was treated with IV antibiotics in ED and transferred to [facility] for treatment.  NS felt that Xrays not needed for work up as patient likely with hematoma leading to symptoms.  He was taken to OR for I &D with evacuation of epidural hematoma by Dr. Arnoldo Morale.  Wound cultures with rare WBC and no growth so far. He had issues with lethargy and confusion post op felt to be due to oxycodone therefore this was discontinued with improvement in mentation.  Therapy evaluations done revealing functional decline and CIR recommended for follow up therapy.       Review of Systems  Constitutional: Negative for chills and fever.  HENT: Negative for hearing loss and tinnitus.   Eyes: Negative for blurred vision and double vision.  Respiratory: Negative for cough, shortness of breath and wheezing.   Cardiovascular: Negative for chest pain and palpitations.  Gastrointestinal: Positive for diarrhea and heartburn. Negative for constipation.  Genitourinary: Negative for dysuria and urgency.  Musculoskeletal: Positive for back pain and joint pain. Negative for myalgias.  Skin: Negative for rash.  Neurological: Positive for weakness. Negative for dizziness and headaches.  Psychiatric/Behavioral: Positive for memory loss. The patient does not have insomnia.           Past Medical History:  Diagnosis Date  . Arthritis      OA- knees   . Bell's palsy 2014  . Bladder cancer ([place])    . BPH (benign prostatic hyperplasia)    . GERD (gastroesophageal reflux disease)    . Gout    . Hypertension    . Sleep apnea       CPAP-in use q night, last study 5 yrs. ago  . Spinal stenosis of lumbar region with radiculopathy             Past Surgical History:  Procedure Laterality Date  . BLADDER TUMOR EXCISION   2017    several cystocscopy for the excision followed by M. Annitta Needs  . BLEPHAROPLASTY Bilateral 2017    in [place]  . CYSTOSCOPY      . HEMATOMA EVACUATION N/A 07/04/2018    Procedure: Lumbar wound exploration and EVACUATION OF EPIDURAL HEMATOMA;  Surgeon: Newman Pies, MD;  Location: [facility];  Service: Neurosurgery;  Laterality: N/A;  . JOINT REPLACEMENT      . KNEE ARTHROSCOPY        2 on each side, one being a repair of the meniscus     . LUMBAR LAMINECTOMY/DECOMPRESSION MICRODISCECTOMY N/A 02/24/2018    Procedure: Lumbar Four-Five Laminectomy/Foraminotomy;  Surgeon: Ashok Pall, MD;  Location: [facility];  Service: Neurosurgery;  Laterality: N/A;  . SHOULDER ARTHROSCOPY Right 2016  . TONSILLECTOMY      . TOTAL KNEE ARTHROPLASTY Right 12/08/2016    Procedure: TOTAL KNEE ARTHROPLASTY;  Surgeon: Vickey Huger, MD;  Location: [facility];  Service: Orthopedics;  Laterality: Right;           Family History  Problem Relation Age of Onset  . Cancer Mother    . Congestive Heart Failure Father  Social History:  Lives alone. Has a girlfriend. Independent with walker PTA but has been sedentary. Retired from the [employer]. He reports that he quit smoking about 15 years ago. He has never used smokeless tobacco. He reports current alcohol use of about 2.0 - 3.0 standard drinks of alcohol per week. He reports that he does not use drugs.           Allergies  Allergen Reactions  . Sulfa Antibiotics Other (See Comments)      UNSPECIFIED REACTION   . Sulfasalazine Dermatitis      UNSPECIFIED SEVERITY REACTION             Medications Prior to Admission  Medication Sig Dispense Refill  . allopurinol (ZYLOPRIM) 100 MG tablet Take 100 mg by mouth daily.      Marland Kitchen amLODipine (NORVASC) 5 MG tablet  Take 5 mg by mouth daily.    3  . aspirin EC 81 MG tablet Take 81 mg by mouth at bedtime.      Marland Kitchen atorvastatin (LIPITOR) 10 MG tablet Take 10 mg by mouth daily.   1  . Chelated Magnesium 100 MG TABS Take 400 mg by mouth 2 (two) times daily.      . Coenzyme Q10 (COQ10) 100 MG CAPS Take 100 mg by mouth daily.      . diazepam (VALIUM) 5 MG tablet Take 5 mg by mouth every 6 (six) hours as needed for anxiety.      . Glucosamine HCl (GLUCOSAMINE PO) Take 2 tablets by mouth 2 (two) times daily.      Marland Kitchen HYDROcodone-acetaminophen (NORCO/VICODIN) 5-325 MG tablet Take 1 tablet by mouth every 6 (six) hours as needed for moderate pain.      Marland Kitchen HYDROmorphone (DILAUDID) 2 MG tablet Take 2 mg by mouth every 4 (four) hours as needed for severe pain.      Marland Kitchen loratadine (CLARITIN) 10 MG tablet Take 10 mg by mouth daily.      Marland Kitchen losartan-hydrochlorothiazide (HYZAAR) 100-12.5 MG tablet Take 1 tablet by mouth daily.      . meloxicam (MOBIC) 15 MG tablet Take 15 mg by mouth daily.      . metoprolol succinate (TOPROL-XL) 100 MG 24 hr tablet Take 100 mg by mouth daily.   4  . Multiple Vitamin (MULTIVITAMIN WITH MINERALS) TABS tablet Take 1 tablet by mouth daily.      . Omega-3 Fatty Acids (FISH OIL) 1200 MG CAPS Take 2,400 mg by mouth 2 (two) times daily.       . pantoprazole (PROTONIX) 40 MG tablet Take 40 mg by mouth daily before breakfast.   3  . Polyvinyl Alcohol-Povidone PF (REFRESH) 1.4-0.6 % SOLN Place 2 drops into both eyes 3 (three) times daily as needed (for dry eyes).      . tamsulosin (FLOMAX) 0.4 MG CAPS capsule Take 0.4 mg by mouth 2 (two) times daily.   3  . Turmeric Curcumin 500 MG CAPS Take 500 mg by mouth 2 (two) times daily.      . vitamin C (ASCORBIC ACID) 500 MG tablet Take 500 mg by mouth daily.      . cyclobenzaprine (FLEXERIL) 10 MG tablet Take 1 tablet (10 mg total) by mouth 3 (three) times daily as needed for muscle spasms. (Patient not taking: Reported on 07/05/2018) 30 tablet 0  . Flaxseed,  Linseed, (FLAXSEED OIL PO) Take 2,000 mg by mouth 2 (two) times daily.       Marland Kitchen HYDROcodone-acetaminophen ([place])  7.5-325 MG tablet Take 1 tablet by mouth every 6 (six) hours. (Patient not taking: Reported on 07/05/2018) 30 tablet 0  . methocarbamol (ROBAXIN) 500 MG tablet Take 1-2 tablets (500-1,000 mg total) by mouth every 6 (six) hours as needed for muscle spasms. (Patient not taking: Reported on 02/15/2018) 60 tablet 0  . Oxycodone HCl 10 MG TABS Take 1 tablet (10 mg total) by mouth every 4 (four) hours as needed. (Patient not taking: Reported on 02/15/2018) 40 tablet 0      Drug Regimen Review  Drug regimen was reviewed and remains appropriate with no significant issues identified   Home: Home Living Family/patient expects to be discharged to:: Private residence Living Arrangements: Alone Available Help at Discharge: (daughter in [place]; girlfriend works days) Type of Home: House Home Access: Stairs to enter Technical brewer of Steps: 1 Entrance Stairs-Rails: None Home Layout: Two level, Able to live on main level with bedroom/bathroom Alternate Level Stairs-Number of Steps: flight Alternate Level Stairs-Rails: Right, Left Bathroom Shower/Tub: Chiropodist: Standard Bathroom Accessibility: Yes Home Equipment: Environmental consultant - 2 wheels, Bedside commode  Lives With: Alone   Functional History: Prior Function Level of Independence: Independent Comments: PLOF prior to initial sugery in Oct 19:  still driving, enjoys spending time in pool, had trouble dressing prior to sx; since surgery on 2/12, LE weakness, multiple falls   Functional Status:  Mobility: Bed Mobility Overal bed mobility: Needs Assistance Bed Mobility: Rolling, Sidelying to Sit Rolling: Min guard Sidelying to sit: Mod assist Sit to sidelying: Mod assist General bed mobility comments: Cues to slow down - he impulsively moved LEs off of the bed without log rolling; satrted over, and he needed  minguard to ensure no twisting; Heavy mod assist to elevate trunk to sit; initial posterior lean once sitting, and needed close guard at EOB as he tended to shift a lot Transfers Overall transfer level: Needs assistance Equipment used: Rolling walker (2 wheeled) Transfers: Sit to/from Stand Sit to Stand: Mod assist Stand pivot transfers: Mod assist General transfer comment: Mod assist to power up from recliner; Noted a tendency to brace backs of LEs against surfaces for stabiltiy -- indicative of high fall risk; cues for hand placement Ambulation/Gait Ambulation/Gait assistance: Mod assist, +2 physical assistance, +2 safety/equipment Gait Distance (Feet): 5 Feet Assistive device: Rolling walker (2 wheeled) Gait Pattern/deviations: Wide base of support, Festinating, Decreased step length - right, Decreased step length - left, Decreased stance time - right, Decreased stance time - left General Gait Details: Small, short, almost festinating steps with very wide BOS; worked on marching in place as well; cued to high-step, which he began to do, but then that extinhuished to not lifting feet off of the floor   ADL: ADL Overall ADL's : Needs assistance/impaired Eating/Feeding: Set up Grooming: Set up, Sitting Upper Body Bathing: Moderate assistance Lower Body Bathing: Moderate assistance, Cueing for back precautions Upper Body Dressing : Maximal assistance Upper Body Dressing Details (indicate cue type and reason): to don/doff brace, max cues, max sequencing Lower Body Dressing: Maximal assistance, Sit to/from stand Toilet Transfer: Moderate assistance, Stand-pivot, RW Toilet Transfer Details (indicate cue type and reason): cues for safety and back precautions throughout- required cues for safe hand placement Toileting - Clothing Manipulation Details (indicate cue type and reason): able to perform peri care at bed level for front Functional mobility during ADLs: Moderate assistance, Cueing for  sequencing, Cueing for safety, Rolling walker General ADL Comments: Pt with decreased cognition, safety, and access  to LB for ADL - Pt presenting as high fall risk throughout session   Cognition: Cognition Overall Cognitive Status: Impaired/Different from baseline Orientation Level: Oriented X4 Cognition Arousal/Alertness: Awake/alert Behavior During Therapy: WFL for tasks assessed/performed Overall Cognitive Status: Impaired/Different from baseline Area of Impairment: Attention, Safety/judgement, Problem solving, Memory Current Attention Level: Sustained Memory: Decreased recall of precautions, Decreased short-term memory Following Commands: Follows one step commands with increased time Safety/Judgement: Decreased awareness of safety, Decreased awareness of deficits Awareness: Emergent Problem Solving: Difficulty sequencing, Requires verbal cues, Requires tactile cues General Comments: More awake than last session; At end of session, he was unable to answer, "what was easy with what we just did?" and 'what was hard with what we just did?"     Blood pressure 116/74, pulse (!) 115, temperature 98.3 F (36.8 C), temperature source Oral, resp. rate 20, SpO2 96 %. Physical Exam  Nursing note and vitals reviewed. Constitutional: He is oriented to person, place, and time. He appears well-developed and well-nourished.   Musculoskeletal:     Comments: Back incision with honeycomb dressing and dried drainage.   Neurological: He is alert and oriented to person, place, and time.  Slow to process and needed time to answer orientation questions. Delayed verbal output. Able to follow simple motor commands--slow movements.      General: No acute distress Mood and affect are appropriate Heart: Regular rate and rhythm no rubs murmurs or extra sounds Lungs: Clear to auscultation, breathing unlabored, no rales or wheezes Abdomen: Positive bowel sounds, soft nontender to palpation,  nondistended Extremities: No clubbing, cyanosis, or edema Skin: No evidence of breakdown, no evidence of rash Neurologic: Cranial nerves II through XII intact, motor strength is 4+/5 in bilateral deltoid, bicep, tricep, grip, hip flexor, knee extensors, ankle dorsiflexor and plantar flexor Sensory exam normal sensation to light touch  in bilateral lower extremities  Musculoskeletal: Full range of motion in all 4 extremities. No joint swelling   Lab Results Last 48 Hours  Results for orders placed or performed during the hospital encounter of 07/04/18 (from the past 48 hour(s))  Urinalysis, Routine w reflex microscopic     Status: None    Collection Time: 07/06/18  9:15 PM  Result Value Ref Range    Color, Urine YELLOW YELLOW    APPearance CLEAR CLEAR    Specific Gravity, Urine 1.016 1.005 - 1.030    pH 6.0 5.0 - 8.0    Glucose, UA NEGATIVE NEGATIVE mg/dL    Hgb urine dipstick NEGATIVE NEGATIVE    Bilirubin Urine NEGATIVE NEGATIVE    Ketones, ur NEGATIVE NEGATIVE mg/dL    Protein, ur NEGATIVE NEGATIVE mg/dL    Nitrite NEGATIVE NEGATIVE    Leukocytes,Ua NEGATIVE NEGATIVE      Comment: Performed at [facility] [address], [place], [place] [address]      Imaging Results (Last 48 hours)  No results found.           Medical Problem List and Plan: 1.  Debility and cognitive deficits secondary to Epidural hematoma, post op lumbar laminectomy with encephalopathy 2.  DVT Prophylaxis/Anticoagulation: Mechanical: Sequential compression devices, below knee Bilateral lower extremities 3. Pain Management: Tylenol as needed for now.  Local measures with heat and/or ice.   4. Mood: LCSW to follow for evaluation and support.  5. Neuropsych: This patient is not fully capable of making decisions on his own behalf. 6. Skin/Wound Care: Monitor wound for healing and for any recurrent drainage.  Monitor for  signs of infection.  7. Fluids/Electrolytes/Nutrition: Monitor I's and O's.   Check Lytes in am.  Offer nutritional supplements as with variable p.o. intake 8. Delirium: Not fully resolved. Continue to monitor. Will  discontinue Norco and flexeril--has not had any in >24 hours. Check UA/UCS. Monitor for other signs of infection.   9.  HTN: Monitor blood pressures twice daily.  Continue Cozaar, Toprol-XL and amlodipine 10.  History of bladder cancer/BPH: Recent episode of urinary retention.  Monitor voiding with PVR checks and cath for volumes greater than 350 cc.- Flomax 11.  Epidural hematoma/ABLA: Recheck CBC in a.m. monitor for signs of bleeding 12.  GERD: Continue Protonix     Post Admission Physician Evaluation: 1. Functional deficits secondary  to Epidural hematoma.  Has some mild encephalopathy as well 2. Patient admitted to receive collaborative, interdisciplinary care between the physiatrist, rehab nursing staff, and therapy team. 3. Patient's level of medical complexity and substantial therapy needs in context of that medical necessity cannot be provided at a lesser intensity of care. 4. Patient has experienced substantial functional loss from his/her baseline.Judging by the patient's diagnosis, physical exam, and functional history, the patient has potential for functional progress which will result in measurable gains while on inpatient rehab.  These gains will be of substantial and practical use upon discharge in facilitating mobility and self-care at the household level. 5. Physiatrist will provide 24 hour management of medical needs as well as oversight of the therapy plan/treatment and provide guidance as appropriate regarding the interaction of the two. 6. 24 hour rehab nursing will assist in the management of  bladder management, bowel management, safety, skin/wound care, disease management, medication administration, pain management and patient education  and help integrate therapy concepts, techniques,education, etc. PT will assess and treat for pre gait,  gait training, endurance , safety, equipment, neuromuscular re education:  .  Goals are: independent with assistive device. 7. OT will assess and treat for ADLs, Cognitive perceptual skills, Neuromuscular re education, safety, endurance, equipment  .  Goals are: supervision.  8. SLP will assess and treat for evaluate cognition  .  Goals are: Mod I with medication management 9. Case Management and Social Worker will assess and treat for psychological issues and discharge planning. 10. Team conference will be held weekly to assess progress toward goals and to determine barriers to discharge. 11.  Patient will receive at least 3 hours of therapy per day at least 5 days per week. 12. ELOS and Prognosis: 9-13d excellent   ."I have personally performed a face to face diagnostic evaluation of this patient.  Additionally, I have reviewed and concur with the physician assistant's documentation above."  Charlett Blake M.D. [facility] FAAPM&R (Sports Med, Neuromuscular Med) Diplomate [place] Board of [place], PA-C 07/08/2018

## 2018-07-08 NOTE — Progress Notes (Signed)
Received pt. As a new admission.Pt. has been oriented to the unit routine.

## 2018-07-08 NOTE — Plan of Care (Signed)
  Problem: Health Behavior/Discharge Planning: Goal: Ability to manage health-related needs will improve Outcome: Progressing   

## 2018-07-08 NOTE — Progress Notes (Signed)
Patient awake and oriented this morning.  Seems more appropriate and interactive.  Still somewhat restless.  Wound culture still no growth.  Patient still complains of a lot of back pain.  No significant lower extremity pain.  William [name]  He is afebrile.  Vital signs are stable.  His wound is clean and dry.  Abdomen soft.  Extremities with normal strength.  Overall slowly improving.  I think his mental status changes the last few days ago likely secondary to medication effect.  Continue efforts at mobilization.  May benefit from inpatient rehab.

## 2018-07-08 NOTE — PMR Pre-admission (Signed)
PMR Admission Coordinator Pre-Admission Assessment  Patient: William [name] is an 68 y.o., male MRN: [id number] DOB: 03-04-1951 Height:   Weight:    Insurance Information HMO:     PPO:      PCP:      IPA:      80/20:      OTHER: no HMO PRIMARY: Medicare a only      Policy#: [id number]      Subscriber: pt Benefits:  Phone #: passport one online     Name: 07/07/2018 Eff. Date: 03/19/2014     Deduct: $1408      Out of Pocket Max: none      Life Max: none CIR: 100%      SNF: 20 full days Outpatient: 80%     Co-Pay: 20% Home Health: 100%      Co-Pay: none DME: 80%     Co-Pay: 20% Providers: pt choice  SECONDARY: William [name]      Policy#: [id number]      Subscriber: pt  Medicaid Application Date:       Case Manager:  Disability Application Date:       Case Worker:   Emergency [place]    Name Relation Home Work Mobile   William [name] Significant other [phone]  [phone]   William [name] Daughter [phone]        Current Medical History  Patient Admitting Diagnosis: Debility; lumbar stenosis with radiculopathy  History of Present Illness:  William [name] is a 68 year old male with history of HTN, bladder cancer,  BPH, lumbar stenosis with radiculopathy s/p decompression 02/2018 and recent surgery few days PTA to [facility] on 07/05/18 with multiple falls, fever, blood drainage from the wound and urinary retention.  He was treated with IV antibiotics in ED and transferred to [facility] for treatment.  NS felt that Xrays not needed for work up as patient likely with hematoma leading to symptoms.  He was taken to OR for I &D with evacuation of epidural hematoma by Dr. Arnoldo Morale.  Wound cultures with rare WBC and no growth so far. He had issues with lethargy and confusion post op felt to be due to oxycodone therefore this was discontinued with improvement in mentation. Patient continues to be restless.   Patient's medical record from [facility] has been reviewed by the rehabilitation admission coordinator and physician.   Past Medical History  Past Medical History:  Diagnosis Date  . Arthritis    OA- knees   . Bell's palsy 2014  . Bladder cancer ([place])   . BPH (benign prostatic hyperplasia)   . GERD (gastroesophageal reflux disease)   . Gout   . Hypertension   . Sleep apnea    CPAP-in use q night, last study 5 yrs. ago  . Spinal stenosis of lumbar region with radiculopathy     Family History   family history includes Cancer in his mother; Congestive Heart Failure in his father.  Prior Rehab/Hospitalizations Has the patient had major surgery during 100 days prior to admission? Yes    Current Medications  Current Facility-Administered Medications:  .  0.9 %  sodium chloride infusion, 250 mL, Intravenous, Continuous, Newman Pies, MD, Stopped at 07/05/18 1700 .  acetaminophen (TYLENOL) tablet 650 mg, 650 mg, Oral, Q4H PRN, 650 mg at 07/07/18 2135 **OR** acetaminophen (TYLENOL) suppository 650 mg, 650 mg, Rectal, Q4H PRN, Newman Pies, MD .  allopurinol (ZYLOPRIM) tablet 100 mg, 100 mg, Oral, Daily, Newman Pies, MD, 100  mg at 07/08/18 0958 .  amLODipine (NORVASC) tablet 5 mg, 5 mg, Oral, Daily, Newman Pies, MD, 5 mg at 07/08/18 [phone] .  atorvastatin (LIPITOR) tablet 10 mg, 10 mg, Oral, q1800, Newman Pies, MD, 10 mg at 07/07/18 1710 .  bisacodyl (DULCOLAX) suppository 10 mg, 10 mg, Rectal, Daily PRN, Newman Pies, MD .  cyclobenzaprine (FLEXERIL) tablet 10 mg, 10 mg, Oral, TID PRN, Newman Pies, MD, 10 mg at 07/05/18 1149 .  docusate sodium (COLACE) capsule 100 mg, 100 mg, Oral, BID, Newman Pies, MD, 100 mg at 07/08/18 0957 .  losartan (COZAAR) tablet 100 mg, 100 mg, Oral, Daily, 100 mg at 07/08/18 0957 **AND** hydrochlorothiazide (MICROZIDE) capsule 12.5 mg, 12.5 mg, Oral, Daily, Newman Pies, MD, 12.5 mg at 07/08/18 0958 .  HYDROcodone-acetaminophen (NORCO) 7.5-325 MG per  tablet 1 tablet, 1 tablet, Oral, Q4H PRN, Newman Pies, MD, 1 tablet at 07/06/18 1617 .  loratadine (CLARITIN) tablet 10 mg, 10 mg, Oral, Daily, Newman Pies, MD, 10 mg at 07/08/18 0957 .  magnesium oxide (MAG-OX) tablet 400 mg, 400 mg, Oral, BID, Newman Pies, MD, 400 mg at 07/08/18 0957 .  menthol-cetylpyridinium (CEPACOL) lozenge 3 mg, 1 lozenge, Oral, PRN **OR** phenol (CHLORASEPTIC) mouth spray 1 spray, 1 spray, Mouth/Throat, PRN, Newman Pies, MD .  metoprolol succinate (TOPROL-XL) 24 hr tablet 100 mg, 100 mg, Oral, Daily, Newman Pies, MD, 100 mg at 07/08/18 0957 .  ondansetron (ZOFRAN) tablet 4 mg, 4 mg, Oral, Q6H PRN **OR** ondansetron (ZOFRAN) injection 4 mg, 4 mg, Intravenous, Q6H PRN, Newman Pies, MD .  pantoprazole (PROTONIX) EC tablet 40 mg, 40 mg, Oral, QAC breakfast, Newman Pies, MD, 40 mg at 07/08/18 2878 .  polyvinyl alcohol (LIQUIFILM TEARS) 1.4 % ophthalmic solution 2 drop, 2 drop, Both Eyes, TID PRN, Newman Pies, MD .  sodium chloride flush (NS) 0.9 % injection 3 mL, 3 mL, Intravenous, Q12H, Newman Pies, MD, 3 mL at 07/08/18 1000 .  sodium chloride flush (NS) 0.9 % injection 3 mL, 3 mL, Intravenous, PRN, Newman Pies, MD .  tamsulosin [facility]) capsule 0.4 mg, 0.4 mg, Oral, BID, Newman Pies, MD, 0.4 mg at 07/08/18 6767  Patients Current Diet:   Diet Order            Diet regular Room service appropriate? Yes; Fluid consistency: Thin  Diet effective now              Precautions / Restrictions Precautions Precautions: Back, Fall Precaution Booklet Issued: Yes (comment) Spinal Brace: Applied in sitting position Restrictions Weight Bearing Restrictions: No   Has the patient had 2 or more falls or a fall with injury in the past year?Yes; just since recent surgery 06/2018  Prior Activity Level Independent and driving without AD prior to recent surgery 06/2018  Prior Functional Level Do you want Prior Function Level of  Independence: Independent Comments: PLOF prior to initial sugery in Oct 19:  still driving, enjoys spending time in pool, had trouble dressing prior to sx; since surgery on 2/12, LE weakness, multiple falls from other? Self Care: Did the patient need help bathing, dressing, using the toilet or eating?  Independent  Indoor Mobility: Did the patient need assistance with walking from room to room (with or without device)? Independent  Stairs: Did the patient need assistance with internal or external stairs (with or without device)? Independent  Functional Cognition: Did the patient need help planning regular tasks such as shopping or remembering to take medications? [place] / Equipment  Home Equipment: [place] - 2 wheels, Bedside commode  Prior Device Use: Indicate devices/aids used by the patient prior to current illness, exacerbation or injury? None of the above  Prior Functional Level Comments: PLOF prior to initial sugery in Oct 19:  still driving, enjoys spending time in pool, had trouble dressing prior to sx; since surgery on 2/12, LE weakness, multiple falls   Prior Functional Level Current Functional Level  Bed Mobility  Independent Roll min guard, side lying to sit mod assist. Initial posterior lean once sitting. Tends to shift a lot at EOB  Transfers Independent  mod assist to power up from recliner; tendency to brace back of LEs against surfaces for stability  Mobility - Walk/Wheelchair Independent    Mobility - Ambulation/Gait Independent Mod assist, +2 physical assistance, +2 safety/equipment. 5 feet with wide BOS, small short steps   Upper Body Dressing Independent Maximal assistance  Lower Body Dressing Independent Maximal assistance, Sit to/from stand  Grooming Independent Set up, Sitting  Eating/Drinking Independent Set up  Toilet Transfer Independent Moderate assistance, Stand-pivot, RW  Bladder Continence Continent   external catheter  Bowel  Management  Continent   continent  Stair Climbing Independent   not attempted  Communication Intact No difficulties, Other (comment)(but noted very distractible)  Memory Intact Decreased recall of precautions, Decreased short-term memory  Cooking/Meal Prep  Independent     Housework  Independent   Money Management  Independent   Driving  yes     Special needs/care consideration BiPAP/CPAP yes pta but admits not using consistency CPM n/a Continuous Drip IV n/a Dialysis n/a Life Vest n/a Oxygen n/a Special Bed fall precautions due to decreased safety awareness and restlessness Trach Size n/a Wound Vac n/a Skin surgical incision Bowel mgmt: continent LBM 2/19 Bladder mgmt: external catheter Diabetic mgmt n/a  Previous Home Environment Living Arrangements: Alone  Lives With: Alone Available Help at Discharge: (daughter in [place]; girlfriend works days) Type of Home: House Home Layout: Two level, Able to live on main level with bedroom/bathroom Alternate Level Stairs-Rails: Right, Left Alternate Level Stairs-Number of Steps: flight Home Access: Stairs to enter Entrance Stairs-Rails: None Entrance Stairs-Number of Steps: 1 Bathroom Shower/Tub: Optometrist: Yes Home Care Services: No Additional Comments: basement level pt like to stay in  Discharge Living Setting Plans for Discharge Living Setting: Patient's home, Alone Type of Home at Discharge: House Discharge Home Layout: Two level, Able to live on main level with bedroom/bathroom Alternate Level Stairs-Rails: Right, Left Alternate Level Stairs-Number of Steps: flight Discharge Home Access: Stairs to enter Entrance Stairs-Rails: None Entrance Stairs-Number of Steps: 1 Discharge Bathroom Shower/Tub: Tub/shower unit Discharge Bathroom Toilet: Standard Discharge Bathroom Accessibility: Yes How Accessible: Accessible via walker Does the patient have any problems  obtaining your medications?: No  Social/Family/Support Systems Patient Roles: Partner, Parent Contact Information: girlfriend Patina and daughter, Cecille Rubin Anticipated Caregiver: girlfriend and daughter intermittently Anticipated Caregiver's Contact Information: see above Ability/Limitations of Caregiver: work during the day. Patina in [place] in [place] Caregiver Availability: Intermittent Discharge Plan Discussed with Primary Caregiver: Yes Is Caregiver In Agreement with Plan?: Yes Does Caregiver/Family have Issues with Lodging/Transportation while Pt is in Rehab?: No  Goals/Additional Needs Patient/Family Goal for Rehab: Mod I with PT, OT, and SLP Expected length of stay: ELOS 10 to 14 days Special Service Needs: decreased safety awareness; restless Pt/Family Agrees to Admission and willing to participate: Yes Program Orientation Provided & Reviewed with Pt/Caregiver Including Roles  & Responsibilities:  Yes  Barriers to Discharge: Decreased caregiver support  Patient Condition: I have reviewed medical records from [facility], spoken with patient and  Daughter. I met with patient at the bedside  for inpatient rehabilitation assessment.  Patient will benefit from ongoing PT, OT, and SLP, can actively participate in 3 hours of therapy a day 5 days of the week, and can make measurable gains during the admission.  Patient will also benefit from the coordinated team approach during an Inpatient Acute Rehabilitation admission.  The patient will receive intensive therapy as well as Rehabilitation physician, nursing, social worker, and care management interventions.  Due to bowel management, bladder management, safety, skin/wound care, disease management, medical administration, pain management, patient education the patient requires 24 hour a day rehabilitation nursing.  The patient is currently mod to max assist with mobility and basic ADLs.  Discharge setting and therapy post  discharge at  home with home health is anticipated.  Patient has agreed to participate in the Acute Inpatient Rehabilitation Program and will admit today.  Preadmission Screen Completed By:  Cleatrice Burke RN MSN, 07/08/2018 1:33 PM ______________________________________________________________________   Discussed status with Dr. Letta Pate  on  07/08/2018 at 1342 and received telephone approval for admission today.  Admission Coordinator:  Cleatrice Burke RN MSN, time 1007 Date 07/08/2018   Assessment/Plan: Diagnosis:Spinal stenosis with epidural hematoma 1. Does the need for close, 24 hr/day  Medical supervision in concert with the patient's rehab needs make it unreasonable for this patient to be served in a less intensive setting? Yes 2. Co-Morbidities requiring supervision/potential complications: Neurogenic bladder, 3. Due to bladder management, bowel management, safety, skin/wound care, disease management, medication administration, pain management and patient education, does the patient require 24 hr/day rehab nursing? Yes 4. Does the patient require coordinated care of a physician, rehab nurse, PT (1-2 hrs/day, 5 days/week) and OT (1-2 hrs/day, 5 days/week) to address physical and functional deficits in the context of the above medical diagnosis(es)? Yes Addressing deficits in the following areas: balance, endurance, locomotion, strength, transferring, bowel/bladder control, bathing, dressing, feeding, grooming, toileting and psychosocial support 5. Can the patient actively participate in an intensive therapy program of at least 3 hrs of therapy 5 days a week? Yes 6. The potential for patient to make measurable gains while on inpatient rehab is good 7. Anticipated functional outcomes upon discharge from inpatients are: supervision PT, supervision OT, n/a SLP 8. Estimated rehab length of stay to reach the above functional goals is: 10-14d 9. Anticipated D/C setting:  Home 10. Anticipated post D/C treatments: [place] therapy 11. Overall Rehab/Functional Prognosis: good  Charlett Blake M.D. [facility] FAAPM&R (Sports Med, Neuromuscular Med) Diplomate Am Board of Electrodiagnostic Med   Cleatrice Burke RN MSN Admissions Coordinator 07/08/2018

## 2018-07-08 NOTE — IPOC Note (Signed)
Overall Plan of Care [facility]) Patient Details Name: William [name] MRN: [id number] DOB: November 06, 1950  Admitting Diagnosis: Epidural hematoma s/p evacuation.  Hospital Problems: Active Problems:   S/P total knee replacement   Lumbar stenosis with neurogenic claudication   Epidural hematoma (HCC)   Urinary incontinence due to benign prostatic hyperplasia   Spinal stenosis of lumbar region with radiculopathy   Transaminitis   Hypoalbuminemia due to protein-calorie malnutrition (HCC)   Hyperglycemia   Acute delirium     Functional Problem List: Nursing Bladder, Bowel, Motor, Pain, Safety, Skin Integrity  PT Balance, Behavior, Edema, Endurance, Motor, Pain, Safety, Sensory, Skin Integrity  OT Balance, Cognition, Endurance, Safety, Pain  SLP Cognition  TR         Basic ADL's: OT Grooming, Bathing, Dressing, Toileting     Advanced  ADL's: OT Simple Meal Preparation     Transfers: PT Bed Mobility, Bed to Chair, Car, Furniture, Floor  OT Toilet, Metallurgist: PT Ambulation, Emergency planning/management officer, Stairs     Additional Impairments: OT None  SLP Communication, Social Cognition comprehension Problem Solving, Memory, Attention, Awareness  TR      Anticipated Outcomes Item Anticipated Outcome  Self Feeding independent  Swallowing      Basic self-care  supervision  Toileting  supervision   Bathroom Transfers supervision  Bowel/Bladder  Continent to bowel and bladder with Mod. assist.  Transfers  Supervision assist with LRAD   Locomotion  Ambulatory with LRAD at supervision assist   Communication  supervision  Cognition  supervision  Pain  Less than 3,on 1 to 10 scale.  Safety/Judgment  Free from falls during his stay in rehab   Therapy Plan: PT Intensity: Minimum of 1-2 x/day ,45 to 90 minutes PT Frequency: 5 out of 7 days PT Duration Estimated Length of Stay: 14-16days  OT Intensity: Minimum of 1-2 x/day, 45 to 90 minutes OT Frequency: 5  out of 7 days OT Duration/Estimated Length of Stay: 12-14 days SLP Intensity: Minumum of 1-2 x/day, 30 to 90 minutes SLP Frequency: 3 to 5 out of 7 days SLP Duration/Estimated Length of Stay: 12-14 days    Team Interventions: Nursing Interventions Patient/Family Education, Pain Management, Skin Care/Wound Management, Bladder Management, Disease Management/Prevention, Discharge Planning  PT interventions Ambulation/gait training, Cognitive remediation/compensation, Discharge planning, DME/adaptive equipment instruction, Functional mobility training, Pain management, Psychosocial support, Splinting/orthotics, Therapeutic Activities, UE/LE Strength taining/ROM, Visual/perceptual remediation/compensation, Training and development officer, Community reintegration, Disease management/prevention, Neuromuscular re-education, Patient/family education, Skin care/wound management, Therapeutic Exercise, Stair training, UE/LE Coordination activities, Wheelchair propulsion/positioning  OT Interventions Training and development officer, Academic librarian, Brewing technologist, Barrister's clerk education, Self Care/advanced ADL retraining, Therapeutic Exercise, UE/LE Coordination activities, Wheelchair propulsion/positioning, Cognitive remediation/compensation, Discharge planning, DME/adaptive equipment instruction, Functional mobility training, Pain management, Therapeutic Activities, UE/LE Strength taining/ROM  SLP Interventions Cognitive remediation/compensation, Environmental controls, Cueing hierarchy, Functional tasks, Internal/external aids, Patient/family education  TR Interventions    SW/CM Interventions Discharge Planning, Psychosocial Support, Patient/Family Education   Barriers to Discharge MD  Medical stability  Nursing      PT Inaccessible home environment, Decreased caregiver support, Medical stability, Lack of/limited family support, Behavior, Medication compliance    OT Decreased caregiver  support will need 24 hr assist  SLP      SW       Team Discharge Planning: Destination: PT-Home ,OT- Home , SLP-  Projected Follow-up: PT-Home health PT, OT-  Home health OT, 24 hour supervision/assistance, SLP-Home Health SLP, 24 hour supervision/assistance Projected Equipment Needs: PT-Wheelchair cushion (measurements), Wheelchair (measurements),  OT- To be determined, SLP-None recommended by SLP Equipment Details: PT- , OT-  Patient/family involved in discharge planning: PT- Patient,  OT-Patient, SLP-Patient  MD ELOS: 10-14 days. Medical Rehab Prognosis:  Good Assessment: 68 year old male with history of HTN, bladder cancer, BPH, lumbar stenosis with radiculopathy s/p decompression 02/2018 and recent surgery few days [facility]18 with multiple falls, fever, blood drainage from the wound and urinary retention. He was treated with IV antibiotics in ED and transferred to [facility] for treatment. NS felt thatXraysnot neededfor work upas patient likely with hematoma leading to symptoms.He was taken to OR for I &D with evacuation of epidural hematoma by Dr. Arnoldo Morale. Wound cultures with rare WBC and no growth so far. He had issues with lethargy and confusion post op felt to be due to oxycodone therefore this was discontinued with improvement in mentation. Patient with resulting functional deficits with mobility, endurance, self-care.  Will set goals for Supervision with PT/OT/SLP.  See Team Conference Notes for weekly updates to the plan of care

## 2018-07-08 NOTE — Progress Notes (Signed)
Charlett Blake, MD  Physician  Physical Medicine and Rehabilitation  PMR Pre-admission  Signed  Date of Service:  07/08/2018 11:14 AM       Related encounter: ED to Hosp-Admission (Current) from 07/04/2018 in [place]         Show:Clear all _0 Manual_1 Template_2 Copied  Added by: _3 Cristina Gong, RN_4 Kirsteins, Luanna Salk, MD  _5 Hover for details  PMR Admission Coordinator Pre-Admission Assessment  Patient: William [name] is an 68 y.o., male MRN: [id number] DOB: Nov 28, 1950 Height:   Weight:    Insurance Information HMO:     PPO:      PCP:      IPA:      80/20:      OTHER: no HMO PRIMARY: Medicare a only      Policy#: [id number]      Subscriber: pt Benefits:  Phone #: passport one online     Name: 07/07/2018 Eff. Date: 03/19/2014     Deduct: $1408      Out of Pocket Max: none      Life Max: none CIR: 100%      SNF: 20 full days Outpatient: 80%     Co-Pay: 20% Home Health: 100%      Co-Pay: none DME: 80%     Co-Pay: 20% Providers: pt choice  SECONDARY: Roseanna Rainbow      Policy#: [id number]      Subscriber: pt  Medicaid Application Date:       Case Manager:  Disability Application Date:       Case Worker:   Emergency [place]    Name Relation Home Work Mobile   Kidd,Patina Significant other [phone]  [phone]   Nadara Eaton Daughter [phone]        Current Medical History  Patient Admitting Diagnosis: Debility; lumbar stenosis with radiculopathy  History of Present Illness: William [name] is a 68 year old male with history of HTN, bladder cancer, BPH, lumbar stenosis with radiculopathy s/p decompression 02/2018 and recent surgery few days PTA to [facility] on 07/05/18 with multiple falls, fever, blood drainage from the wound and urinary retention. He was treated with IV antibiotics in ED and transferred to [facility] for  treatment. NS felt thatXraysnot neededfor work upas patient likely with hematoma leading to symptoms.He was taken to OR for I &D with evacuation of epidural hematoma by Dr. Arnoldo Morale. Wound cultures with rare WBC and no growth so far. He had issues with lethargy and confusion post op felt to be due to oxycodone therefore this was discontinued with improvement in mentation. Patient continues to be restless.  Patient's medical record from [facility] has been reviewed by the rehabilitation admission coordinator and physician.   Past Medical History      Past Medical History:  Diagnosis Date  . Arthritis    OA- knees   . Bell's palsy 2014  . Bladder cancer ([place])   . BPH (benign prostatic hyperplasia)   . GERD (gastroesophageal reflux disease)   . Gout   . Hypertension   . Sleep apnea    CPAP-in use q night, last study 5 yrs. ago  . Spinal stenosis of lumbar region with radiculopathy     Family History   family history includes Cancer in his mother; Congestive Heart Failure in his father.  Prior Rehab/Hospitalizations Has the patient had major surgery during 100 days prior  to admission? Yes              Current Medications  Current Facility-Administered Medications:  .  0.9 %  sodium chloride infusion, 250 mL, Intravenous, Continuous, Newman Pies, MD, Stopped at 07/05/18 1700 .  acetaminophen (TYLENOL) tablet 650 mg, 650 mg, Oral, Q4H PRN, 650 mg at 07/07/18 2135 **OR** acetaminophen (TYLENOL) suppository 650 mg, 650 mg, Rectal, Q4H PRN, Newman Pies, MD .  allopurinol (ZYLOPRIM) tablet 100 mg, 100 mg, Oral, Daily, Newman Pies, MD, 100 mg at 07/08/18 1610 .  amLODipine (NORVASC) tablet 5 mg, 5 mg, Oral, Daily, Newman Pies, MD, 5 mg at 07/08/18 [phone] .  atorvastatin (LIPITOR) tablet 10 mg, 10 mg, Oral, q1800, Newman Pies, MD, 10 mg at 07/07/18 1710 .  bisacodyl (DULCOLAX) suppository 10 mg, 10 mg, Rectal, Daily PRN, Newman Pies, MD .  cyclobenzaprine (FLEXERIL) tablet 10 mg, 10 mg, Oral, TID PRN, Newman Pies, MD, 10 mg at 07/05/18 1149 .  docusate sodium (COLACE) capsule 100 mg, 100 mg, Oral, BID, Newman Pies, MD, 100 mg at 07/08/18 0957 .  losartan (COZAAR) tablet 100 mg, 100 mg, Oral, Daily, 100 mg at 07/08/18 0957 **AND** hydrochlorothiazide (MICROZIDE) capsule 12.5 mg, 12.5 mg, Oral, Daily, Newman Pies, MD, 12.5 mg at 07/08/18 0958 .  HYDROcodone-acetaminophen (NORCO) 7.5-325 MG per tablet 1 tablet, 1 tablet, Oral, Q4H PRN, Newman Pies, MD, 1 tablet at 07/06/18 1617 .  loratadine (CLARITIN) tablet 10 mg, 10 mg, Oral, Daily, Newman Pies, MD, 10 mg at 07/08/18 0957 .  magnesium oxide (MAG-OX) tablet 400 mg, 400 mg, Oral, BID, Newman Pies, MD, 400 mg at 07/08/18 0957 .  menthol-cetylpyridinium (CEPACOL) lozenge 3 mg, 1 lozenge, Oral, PRN **OR** phenol (CHLORASEPTIC) mouth spray 1 spray, 1 spray, Mouth/Throat, PRN, Newman Pies, MD .  metoprolol succinate (TOPROL-XL) 24 hr tablet 100 mg, 100 mg, Oral, Daily, Newman Pies, MD, 100 mg at 07/08/18 0957 .  ondansetron (ZOFRAN) tablet 4 mg, 4 mg, Oral, Q6H PRN **OR** ondansetron (ZOFRAN) injection 4 mg, 4 mg, Intravenous, Q6H PRN, Newman Pies, MD .  pantoprazole (PROTONIX) EC tablet 40 mg, 40 mg, Oral, QAC breakfast, Newman Pies, MD, 40 mg at 07/08/18 5409 .  polyvinyl alcohol (LIQUIFILM TEARS) 1.4 % ophthalmic solution 2 drop, 2 drop, Both Eyes, TID PRN, Newman Pies, MD .  sodium chloride flush (NS) 0.9 % injection 3 mL, 3 mL, Intravenous, Q12H, Newman Pies, MD, 3 mL at 07/08/18 1000 .  sodium chloride flush (NS) 0.9 % injection 3 mL, 3 mL, Intravenous, PRN, Newman Pies, MD .  tamsulosin [facility]) capsule 0.4 mg, 0.4 mg, Oral, BID, Newman Pies, MD, 0.4 mg at 07/08/18 8119  Patients Current Diet:      Diet Order                  Diet regular Room service appropriate? Yes; Fluid consistency: Thin   Diet effective now               Precautions / Restrictions Precautions Precautions: Back, Fall Precaution Booklet Issued: Yes (comment) Spinal Brace: Applied in sitting position Restrictions Weight Bearing Restrictions: No   Has the patient had 2 or more falls or a fall with injury in the past year?Yes; just since recent surgery 06/2018  Prior Activity Level Independent and driving without AD prior to recent surgery 06/2018  Prior Functional Level Do you want Prior Function Level of Independence: Independent Comments: PLOF prior to initial sugery in Oct 19:  still driving, enjoys  spending time in pool, had trouble dressing prior to sx; since surgery on 2/12, LE weakness, multiple falls from other? Self Care: Did the patient need help bathing, dressing, using the toilet or eating?  Independent  Indoor Mobility: Did the patient need assistance with walking from room to room (with or without device)? Independent  Stairs: Did the patient need assistance with internal or external stairs (with or without device)? Independent  Functional Cognition: Did the patient need help planning regular tasks such as shopping or remembering to take medications? Independent  Home Assistive Devices / Equipment Home Equipment: Walker - 2 wheels, Bedside commode  Prior Device Use: Indicate devices/aids used by the patient prior to current illness, exacerbation or injury? None of the above  Prior Functional Level Comments: PLOF prior to initial sugery in Oct 19:  still driving, enjoys spending time in pool, had trouble dressing prior to sx; since surgery on 2/12, LE weakness, multiple falls   Prior Functional Level Current Functional Level  Bed Mobility  Independent Roll min guard, side lying to sit mod assist. Initial posterior lean once sitting. Tends to shift a lot at EOB  Transfers Independent  mod assist to power up from recliner; tendency to brace back of LEs against surfaces  for stability  Mobility - Walk/Wheelchair Independent   Mobility - Ambulation/Gait Independent Mod assist, +2 physical assistance, +2 safety/equipment. 5 feet with wide BOS, small short steps   Upper Body Dressing Independent Maximal assistance  Lower Body Dressing Independent Maximal assistance, Sit to/from stand  Grooming Independent Set up, Sitting  Eating/Drinking Independent Set up  Toilet Transfer Independent Moderate assistance, Stand-pivot, RW  Bladder Continence Continent   external catheter  Bowel Management  Continent   continent  Stair Climbing Independent   not attempted  Communication Intact No difficulties, Other (comment)(but noted very distractible)  Memory Intact Decreased recall of precautions, Decreased short-term memory  Cooking/Meal Prep  Independent     Housework  Independent   Money Management  Independent   Driving  yes     Special needs/care consideration BiPAP/CPAP yes pta but admits not using consistency CPM n/a Continuous Drip IV n/a Dialysis n/a Life Vest n/a Oxygen n/a Special Bed fall precautions due to decreased safety awareness and restlessness Trach Size n/a Wound Vac n/a Skin surgical incision Bowel mgmt: continent LBM 2/19 Bladder mgmt: external catheter Diabetic mgmt n/a  Previous Home Environment Living Arrangements: Alone  Lives With: Alone Available Help at Discharge: (daughter in [place]; girlfriend works days) Type of Home: House Home Layout: Two level, Able to live on main level with bedroom/bathroom Alternate Level Stairs-Rails: Right, Left Alternate Level Stairs-Number of Steps: flight Home Access: Stairs to enter Entrance Stairs-Rails: None Entrance Stairs-Number of Steps: 1 Bathroom Shower/Tub: Optometrist: Yes Home Care Services: No Additional Comments: basement level pt like to stay in  Discharge Living Setting Plans for Discharge Living Setting:  Patient's home, Alone Type of Home at Discharge: House Discharge Home Layout: Two level, Able to live on main level with bedroom/bathroom Alternate Level Stairs-Rails: Right, Left Alternate Level Stairs-Number of Steps: flight Discharge Home Access: Stairs to enter Entrance Stairs-Rails: None Entrance Stairs-Number of Steps: 1 Discharge Bathroom Shower/Tub: Tub/shower unit Discharge Bathroom Toilet: Standard Discharge Bathroom Accessibility: Yes How Accessible: Accessible via walker Does the patient have any problems obtaining your medications?: No  Social/Family/Support Systems Patient Roles: Partner, Parent Contact Information: girlfriend Patina and daughter, Cecille Rubin Anticipated Caregiver: girlfriend and daughter intermittently Anticipated Ambulance person  Information: see above Ability/Limitations of Caregiver: work during the day. Patina in [place] in [place] Caregiver Availability: Intermittent Discharge Plan Discussed with Primary Caregiver: Yes Is Caregiver In Agreement with Plan?: Yes Does Caregiver/Family have Issues with Lodging/Transportation while Pt is in Rehab?: No  Goals/Additional Needs Patient/Family Goal for Rehab: Mod I with PT, OT, and SLP Expected length of stay: ELOS 10 to 14 days Special Service Needs: decreased safety awareness; restless Pt/Family Agrees to Admission and willing to participate: Yes Program Orientation Provided & Reviewed with Pt/Caregiver Including Roles  & Responsibilities: Yes  Barriers to Discharge: Decreased caregiver support  Patient Condition: I have reviewed medical records from [facility], spoken with patient and  Daughter. I met with patient at the bedside  for inpatient rehabilitation assessment.  Patient will benefit from ongoing PT, OT, and SLP, can actively participate in 3 hours of therapy a day 5 days of the week, and can make measurable gains during the admission.  Patient will also benefit from the  coordinated team approach during an Inpatient Acute Rehabilitation admission.  The patient will receive intensive therapy as well as Rehabilitation physician, nursing, social worker, and care management interventions.  Due to bowel management, bladder management, safety, skin/wound care, disease management, medical administration, pain management, patient education the patient requires 24 hour a day rehabilitation nursing.  The patient is currently mod to max assist with mobility and basic ADLs.  Discharge setting and therapy post discharge at  home with home health is anticipated.  Patient has agreed to participate in the Acute Inpatient Rehabilitation Program and will admit today.  Preadmission Screen Completed By:  Cleatrice Burke RN MSN, 07/08/2018 1:33 PM ______________________________________________________________________   Discussed status with Dr. Letta Pate  on  07/08/2018 at 1342 and received telephone approval for admission today.  Admission Coordinator:  Cleatrice Burke RN MSN, time 4742 Date 07/08/2018   Assessment/Plan: Diagnosis:Spinal stenosis with epidural hematoma 1. Does the need for close, 24 hr/day  Medical supervision in concert with the patient's rehab needs make it unreasonable for this patient to be served in a less intensive setting? Yes 2. Co-Morbidities requiring supervision/potential complications: Neurogenic bladder, 3. Due to bladder management, bowel management, safety, skin/wound care, disease management, medication administration, pain management and patient education, does the patient require 24 hr/day rehab nursing? Yes 4. Does the patient require coordinated care of a physician, rehab nurse, PT (1-2 hrs/day, 5 days/week) and OT (1-2 hrs/day, 5 days/week) to address physical and functional deficits in the context of the above medical diagnosis(es)? Yes Addressing deficits in the following areas: balance, endurance, locomotion, strength, transferring,  bowel/bladder control, bathing, dressing, feeding, grooming, toileting and psychosocial support 5. Can the patient actively participate in an intensive therapy program of at least 3 hrs of therapy 5 days a week? Yes 6. The potential for patient to make measurable gains while on inpatient rehab is good 7. Anticipated functional outcomes upon discharge from inpatients are: supervision PT, supervision OT, n/a SLP 8. Estimated rehab length of stay to reach the above functional goals is: 10-14d 9. Anticipated D/C setting: Home 10. Anticipated post D/C treatments: [place] therapy 11. Overall Rehab/Functional Prognosis: good  Charlett Blake M.D. [facility] FAAPM&R (Sports Med, Neuromuscular Med) Diplomate Am Board of Electrodiagnostic Med   Cleatrice Burke RN MSN Admissions Coordinator 07/08/2018        Revision History

## 2018-07-08 NOTE — Progress Notes (Signed)
Inpatient Rehabilitation Admissions Coordinator  I met with patient at bedside. He is more alert and less confused today that noted yesterday. He is in agreement to admit to inpt rehab today. I contacted Dr. Annette Stable and he will d/c patient to inpt rehab today. I contacted pt's girlfriend, Patina, by phone and she is aware and in agreement. RN CM and RN made aware. I will make the arrangements to admit today.  Danne Baxter, RN, MSN Rehab Admissions Coordinator [phone] 07/08/2018 11:30 AM

## 2018-07-08 NOTE — Progress Notes (Signed)
Gave report to [place] on [facility]. Assisting with pt transfer to floor. Notified daughter Cecille Rubin that pt is being transferred to unit.

## 2018-07-09 ENCOUNTER — Inpatient Hospital Stay (HOSPITAL_COMMUNITY): Payer: Federal, State, Local not specified - PPO | Admitting: Occupational Therapy

## 2018-07-09 ENCOUNTER — Inpatient Hospital Stay (HOSPITAL_COMMUNITY): Payer: Federal, State, Local not specified - PPO | Admitting: Speech Pathology

## 2018-07-09 ENCOUNTER — Inpatient Hospital Stay (HOSPITAL_COMMUNITY): Payer: Federal, State, Local not specified - PPO | Admitting: Physical Therapy

## 2018-07-09 DIAGNOSIS — R740 Nonspecific elevation of levels of transaminase and lactic acid dehydrogenase [LDH]: Secondary | ICD-10-CM

## 2018-07-09 DIAGNOSIS — R7401 Elevation of levels of liver transaminase levels: Secondary | ICD-10-CM

## 2018-07-09 DIAGNOSIS — R410 Disorientation, unspecified: Secondary | ICD-10-CM

## 2018-07-09 DIAGNOSIS — E46 Unspecified protein-calorie malnutrition: Secondary | ICD-10-CM

## 2018-07-09 DIAGNOSIS — R739 Hyperglycemia, unspecified: Secondary | ICD-10-CM

## 2018-07-09 LAB — COMPREHENSIVE METABOLIC PANEL
ALT: 123 U/L — ABNORMAL HIGH (ref 0–44)
ANION GAP: 10 (ref 5–15)
AST: 51 U/L — ABNORMAL HIGH (ref 15–41)
Albumin: 2.8 g/dL — ABNORMAL LOW (ref 3.5–5.0)
Alkaline Phosphatase: 66 U/L (ref 38–126)
BUN: 20 mg/dL (ref 8–23)
CO2: 29 mmol/L (ref 22–32)
Calcium: 9.4 mg/dL (ref 8.9–10.3)
Chloride: 96 mmol/L — ABNORMAL LOW (ref 98–111)
Creatinine, Ser: 0.96 mg/dL (ref 0.61–1.24)
GFR calc Af Amer: >60 mL/min (ref >60)
GFR calc non Af Amer: >60 mL/min (ref >60)
Glucose, Bld: 140 mg/dL — ABNORMAL HIGH (ref 70–99)
Potassium: 3.7 mmol/L (ref 3.5–5.1)
Sodium: 135 mmol/L (ref 135–145)
Total Bilirubin: 0.6 mg/dL (ref 0.3–1.2)
Total Protein: 6.1 g/dL — ABNORMAL LOW (ref 6.5–8.1)

## 2018-07-09 LAB — CBC WITH DIFFERENTIAL/PLATELET
Abs Immature Granulocytes: 0.12 10*3/uL — ABNORMAL HIGH (ref 0.00–0.07)
BASOS PCT: 0 %
Basophils Absolute: 0 10*3/uL (ref 0.0–0.1)
EOS ABS: 0.2 10*3/uL (ref 0.0–0.5)
EOS PCT: 2 %
HCT: 37.3 % — ABNORMAL LOW (ref 39.0–52.0)
Hemoglobin: 13 g/dL (ref 13.0–17.0)
Immature Granulocytes: 1 %
Lymphocytes Relative: 20 %
Lymphs Abs: 1.9 10*3/uL (ref 0.7–4.0)
MCH: 29.9 pg (ref 26.0–34.0)
MCHC: 34.9 g/dL (ref 30.0–36.0)
MCV: 85.7 fL (ref 80.0–100.0)
Monocytes Absolute: 0.8 10*3/uL (ref 0.1–1.0)
Monocytes Relative: 8 %
Neutro Abs: 6.6 10*3/uL (ref 1.7–7.7)
Neutrophils Relative %: 69 %
PLATELETS: 255 10*3/uL (ref 150–400)
RBC: 4.35 MIL/uL (ref 4.22–5.81)
RDW: 11.6 % (ref 11.5–15.5)
WBC: 9.6 10*3/uL (ref 4.0–10.5)
nRBC: 0 % (ref 0.0–0.2)

## 2018-07-09 LAB — AEROBIC/ANAEROBIC CULTURE W GRAM STAIN (SURGICAL/DEEP WOUND): Culture: NO GROWTH

## 2018-07-09 MED ORDER — PRO-STAT SUGAR FREE PO LIQD
30.0000 mL | Freq: Two times a day (BID) | ORAL | Status: DC
  Administered 2018-07-09 – 2018-07-23 (×28): 30 mL via ORAL
  Filled 2018-07-09 (×29): qty 30

## 2018-07-09 NOTE — Progress Notes (Signed)
RT NOTE:  Pt refuses CPAP tonight. Pt understands RT is available if he changes his mind.

## 2018-07-09 NOTE — Evaluation (Signed)
Occupational Therapy Assessment and Plan  Patient Details  Name: William [name] MRN: [id number] Date of Birth: 1951/03/27  OT Diagnosis: abnormal posture, acute pain, cognitive deficits and muscle weakness (generalized) Rehab Potential: Rehab Potential (ACUTE ONLY): Good ELOS: 12-14 days   Today's Date: 07/09/2018 OT Individual Time: 5726-2035 OT Individual Time Calculation (min): 55 min     Problem List:  Patient Active Problem List   Diagnosis Date Noted  . Transaminitis   . Hypoalbuminemia due to protein-calorie malnutrition ([place])   . Hyperglycemia   . Acute delirium   . Urinary incontinence due to benign prostatic hyperplasia 07/08/2018  . Spinal stenosis of lumbar region with radiculopathy 07/08/2018  . Epidural hematoma ([place]) 07/05/2018  . Acute urinary retention 07/04/2018  . Lumbar stenosis with neurogenic claudication 02/24/2018  . S/P total knee replacement 12/08/2016    Past Medical History:  Past Medical History:  Diagnosis Date  . Arthritis    OA- knees   . Bell's palsy 2014  . Bladder cancer ([place])   . BPH (benign prostatic hyperplasia)   . GERD (gastroesophageal reflux disease)   . Gout   . Hypertension   . Sleep apnea    CPAP-in use q night, last study 5 yrs. ago  . Spinal stenosis of lumbar region with radiculopathy    Past Surgical History:  Past Surgical History:  Procedure Laterality Date  . BLADDER TUMOR EXCISION  2017   several cystocscopy for the excision followed by M. Annitta Needs  . BLEPHAROPLASTY Bilateral 2017   in [place]  . CYSTOSCOPY    . HEMATOMA EVACUATION N/A 07/04/2018   Procedure: Lumbar wound exploration and EVACUATION OF EPIDURAL HEMATOMA;  Surgeon: Newman Pies, MD;  Location: [place];  Service: Neurosurgery;  Laterality: N/A;  . JOINT REPLACEMENT    . KNEE ARTHROSCOPY     2 on each side, one being a repair of the meniscus     . LUMBAR LAMINECTOMY/DECOMPRESSION MICRODISCECTOMY N/A 02/24/2018   Procedure: Lumbar  Four-Five Laminectomy/Foraminotomy;  Surgeon: Ashok Pall, MD;  Location: [place];  Service: Neurosurgery;  Laterality: N/A;  . SHOULDER ARTHROSCOPY Right 2016  . TONSILLECTOMY    . TOTAL KNEE ARTHROPLASTY Right 12/08/2016   Procedure: TOTAL KNEE ARTHROPLASTY;  Surgeon: Vickey Huger, MD;  Location: [place];  Service: Orthopedics;  Laterality: Right;    Assessment & Plan Clinical Impression: Patient is a 68 y.o. year old male with recent admission to the hospital on 07/05/18 with multiple falls, fever, blood drainage from the wound and urinary retention. He was treated with IV antibiotics in ED and transferred to [facility] for treatment. NS felt thatXraysnot neededfor work upas patient likely with hematoma leading to symptoms.He was taken to OR for I &D with evacuation of epidural hematoma by Dr. Arnoldo Morale.  Patient transferred to CIR on 07/08/2018 .    Patient currently requires max with basic self-care skills secondary to muscle weakness, decreased attention, decreased awareness, decreased problem solving, decreased safety awareness, decreased memory and delayed processing and decreased sitting balance, decreased standing balance, decreased postural control, decreased balance strategies and difficulty maintaining precautions.  Prior to hospitalization, patient could complete ADLs  with modified independent .  Patient will benefit from skilled intervention to decrease level of assist with basic self-care skills and increase independence with basic self-care skills prior to discharge home with care partner.  Anticipate patient will require 24 hour supervision and follow up home health.  OT - End of Session Activity Tolerance: Decreased this session Endurance  Deficit: Yes OT Assessment Rehab Potential (ACUTE ONLY): Good OT Barriers to Discharge: Decreased caregiver support OT Barriers to Discharge Comments: will need 24 hr assist OT Patient demonstrates impairments in the following area(s):  Balance;Cognition;Endurance;Safety;Pain OT Basic ADL's Functional Problem(s): Grooming;Bathing;Dressing;Toileting OT Advanced ADL's Functional Problem(s): Simple Meal Preparation OT Transfers Functional Problem(s): Toilet;Tub/Shower OT Additional Impairment(s): None OT Plan OT Intensity: Minimum of 1-2 x/day, 45 to 90 minutes OT Frequency: 5 out of 7 days OT Duration/Estimated Length of Stay: 12-14 days OT Treatment/Interventions: Medical illustrator training;Community reintegration;Neuromuscular re-education;Patient/family education;Self Care/advanced ADL retraining;Therapeutic Exercise;UE/LE Coordination activities;Wheelchair propulsion/positioning;Cognitive remediation/compensation;Discharge planning;DME/adaptive equipment instruction;Functional mobility training;Pain management;Therapeutic Activities;UE/LE Strength taining/ROM OT Self Feeding Anticipated Outcome(s): independent OT Basic Self-Care Anticipated Outcome(s): supervision OT Toileting Anticipated Outcome(s): supervision OT Bathroom Transfers Anticipated Outcome(s): supervision OT Recommendation Patient destination: Home Follow Up Recommendations: Home health OT;24 hour supervision/assistance Equipment Recommended: To be determined   Skilled Therapeutic Intervention Began working on selfcare retraining sit to stand at the sink this session.  Pt with LEs hanging off of the bed when therapist entered and pt sideways in the bed.  Pt questioned about back precautions, but he was unable to name any.  Therapist helped him get his LEs back in the bed and complete supine to sit following his precautions with mod assist.  Max assist for stand pivot transfer to the wheelchair in order to complete bathing at the sink.  Pt not oriented to day of the week or reason for hospitalization, but he was correct on the month and year.  Mod instructional cueing to sequence bathing secondary to confusion and decreased sustained attention.  He needed max  assist to remove back brace for bathing with mod demonstrational cueing for hand placement when completing sit to stand.  Posterior lean in standing as well as when sitting EOB during session.  Max assist for all LB dressing with mod assist for bathing.  Pt stating he was not familiar with AE.  Pt with no clothing so donned new gown with max assist for brief and gripper socks.  Pt transferred back to the bed at end of session secondary to attention level and lability.  Call button in reach with bed alarm in place.    OT Evaluation Precautions/Restrictions  Precautions Precautions: Back;Fall Required Braces or Orthoses: Spinal Brace Spinal Brace: Applied in sitting position Restrictions Weight Bearing Restrictions: No General   Vital Signs Therapy Vitals Pulse Rate: 97 BP: (!) 103/58 Patient Position (if appropriate): Sitting Pain Pain Assessment Pain Scale: Faces Pain Score: 7  Faces Pain Scale: Hurts a little bit Pain Type: Acute pain Pain Location: Back Pain Orientation: Lower Pain Descriptors / Indicators: Discomfort Pain Onset: With Activity Pain Intervention(s): RN made aware;Emotional support Multiple Pain Sites: No Home Living/Prior Functioning Home Living Family/patient expects to be discharged to:: Private residence Living Arrangements: Spouse/significant other, Children Available Help at Discharge: Family, Available PRN/intermittently Type of Home: House Home Access: Stairs to enter Technical brewer of Steps: 1 Entrance Stairs-Rails: None Home Layout: Two level, Able to live on main level with bedroom/bathroom Alternate Level Stairs-Number of Steps: flight Alternate Level Stairs-Rails: Right Bathroom Shower/Tub: Multimedia programmer: Standard  Lives With: Alone IADL History Homemaking Responsibilities: Yes Meal Prep Responsibility: Therapist, occupational Responsibility: Primary Cleaning Responsibility: Primary Current License: Yes Occupation:  Retired Prior Function Level of Independence: Independent with basic ADLs  Able to [employer]?: Yes Driving: Yes Vocation: Other (Comment)(unclear - pt reported he works in "[employer]" but question accuracy given confusion) Comments: PLOF prior  to initial sugery in Oct 19:  still driving, enjoys spending time in pool, had trouble dressing prior to sx; since surgery on 2/12, LE weakness, multiple falls ADL ADL Grooming: Setup Where Assessed-Grooming: Wheelchair Upper Body Bathing: Supervision/safety Where Assessed-Upper Body Bathing: Wheelchair Lower Body Bathing: Maximal assistance Where Assessed-Lower Body Bathing: Wheelchair Upper Body Dressing: Maximal assistance Where Assessed-Upper Body Dressing: Wheelchair Lower Body Dressing: Maximal assistance Where Assessed-Lower Body Dressing: Wheelchair Toileting: Maximal assistance Where Assessed-Toileting: Bedside Commode Toilet Transfer: Maximal assistance Toilet Transfer Method: Arts development officer: Bedside commode Vision Baseline Vision/History: Wears glasses Wears Glasses: At all times Patient Visual Report: No change from baseline Vision Assessment?: No apparent visual deficits Perception  Perception: Within Functional Limits Praxis Praxis: Intact Cognition Overall Cognitive Status: Impaired/Different from baseline Arousal/Alertness: Lethargic Orientation Level: Person;Place Year: 2020 Month: February Day of Week: Incorrect(Wednesday) Memory: Impaired Memory Impairment: Storage deficit;Decreased recall of new information;Decreased short term memory Decreased Short Term Memory: Verbal basic;Functional basic Immediate Memory Recall: Sock;Blue;Bed Memory Recall: Blue Memory Recall Blue: With Cue Attention: Focused;Sustained Focused Attention: Appears intact Sustained Attention: Impaired Sustained Attention Impairment: Verbal basic;Functional basic Awareness: Impaired Awareness Impairment:  Intellectual impairment Problem Solving: Impaired Problem Solving Impairment: Verbal basic;Functional basic Behaviors: Lability Safety/Judgment: Impaired Sensation Sensation Light Touch: Appears Intact Hot/Cold: Appears Intact Proprioception: Appears Intact Proprioception Impaired Details: Impaired RUE Stereognosis: Appears Intact Additional Comments: Sensation intact in BUEs Coordination Gross Motor Movements are Fluid and Coordinated: No Fine Motor Movements are Fluid and Coordinated: No Coordination and Movement Description: Decreased coordination in his LEs secondary to pain Motor  Motor Motor: Abnormal postural alignment and control Motor - Skilled Clinical Observations: BLE ataxia R>L Mobility  Bed Mobility Bed Mobility: Sit to Supine;Supine to Sit Supine to Sit: Moderate Assistance - Patient 50-74% Sit to Supine: Moderate Assistance - Patient 50-74% Transfers Sit to Stand: Moderate Assistance - Patient 50-74%  Trunk/Postural Assessment  Cervical Assessment Cervical Assessment: Within Functional Limits Thoracic Assessment Thoracic Assessment: Within Functional Limits Lumbar Assessment Lumbar Assessment: Exceptions to WFL(lumbar corset for precautions and stability) Postural Control Postural Control: Deficits on evaluation(posterior LOB and lean in sitting and standing)  Balance Balance Balance Assessed: Yes Static Sitting Balance Static Sitting - Balance Support: Feet supported Static Sitting - Level of Assistance: 2: Max assist Dynamic Sitting Balance Dynamic Sitting - Balance Support: During functional activity Dynamic Sitting - Level of Assistance: 2: Max assist Sitting balance - Comments: constant posterior Lean. able to correct for up to 10 sec, then mod-max assist to correct.  Static Standing Balance Static Standing - Balance Support: During functional activity Static Standing - Level of Assistance: 3: Mod assist Dynamic Standing Balance Dynamic Standing  - Balance Support: During functional activity Dynamic Standing - Level of Assistance: 2: Max assist Dynamic Standing - Comments: max assist to prevent posterior LOB with and without UE support on RW  Extremity/Trunk Assessment RUE Assessment RUE Assessment: Within Functional Limits(AROM WFLS for selfcare tasks.  strength not tested secondary to precautions) LUE Assessment LUE Assessment: Within Functional Limits(AROM WFLs for selfcare tasks.  Not formally tested secondary to precautions)     Refer to Care Plan for Long Term Goals  Recommendations for other services: None    Discharge Criteria: Patient will be discharged from OT if patient refuses treatment 3 consecutive times without medical reason, if treatment goals not met, if there is a change in medical status, if patient makes no progress towards goals or if patient is discharged from hospital.  The above assessment, treatment  plan, treatment alternatives and goals were discussed and mutually agreed upon: by patient  Kylar Speelman,[place] OTR/L 07/09/2018, 12:45 PM

## 2018-07-09 NOTE — Discharge Summary (Signed)
Physician Discharge Summary  Patient ID: William [name] MRN: [id number] DOB/AGE: 01/19/1951 68 y.o.  Admit date: 07/04/2018 Discharge date: 07/08/2018  Admission Diagnoses: Epidural hematoma  Discharge Diagnoses:  Active Problems:   S/P total knee replacement   Lumbar stenosis with neurogenic claudication   Epidural hematoma (HCC)   Urinary incontinence due to benign prostatic hyperplasia   Spinal stenosis of lumbar region with radiculopathy   Discharged Condition: good  Hospital Course: William [name] underwent an L4-5 decompression, instrumentation and fusion by Dr. Christella Noa on 2/12 at the [facility]. The patient did well initially and was discharged home. After discharge, he developed lower extremity weakness. He had 4-5 falls, urinary retention and a fever. He underwent an incision and drainage of his wound and an evacuation of epidural hematoma by Dr. Arnoldo Morale. He is able to void on his own now. Urinalysis from 07/06/2018 was negative. He developed mild confusion throughout the day on 07/06/2018. Nursing staff and daughter believe this is medication related. Pain medication was scaled back and William [name] is slowly returning to his baseline. He is ready to discharge to CIR.    Consults: rehabilitation medicine  Treatments: Surgery: Incision and drainage of wound and evacuation of epidural hematoma  Discharge Exam: Blood pressure 131/69, pulse 86, temperature 98.1 F (36.7 C), temperature source Oral, resp. rate 16, height 5\' 11"  (1.803 m), weight 91.9 kg, SpO2 93 %.   Alert and oriented to person, place, and time; Restless MAE Strength 4+/5 BUE, BLE PERRLA Incision covered with honeycomb, small amount of dried drainage  Disposition: Inpatient rehabilitation      Signed: Patricia Nettle 07/09/2018, 8:00 AM

## 2018-07-09 NOTE — Progress Notes (Signed)
Social Work  Social Work Assessment and Plan  Patient Details  Name: William [name] MRN: [id number] Date of Birth: 1950-06-11  Today's Date: 07/09/2018  Problem List:  Patient Active Problem List   Diagnosis Date Noted  . Transaminitis   . Hypoalbuminemia due to protein-calorie malnutrition ([place])   . Hyperglycemia   . Acute delirium   . Urinary incontinence due to benign prostatic hyperplasia 07/08/2018  . Spinal stenosis of lumbar region with radiculopathy 07/08/2018  . Epidural hematoma ([place]) 07/05/2018  . Acute urinary retention 07/04/2018  . Lumbar stenosis with neurogenic claudication 02/24/2018  . S/P total knee replacement 12/08/2016   Past Medical History:  Past Medical History:  Diagnosis Date  . Arthritis    OA- knees   . Bell's palsy 2014  . Bladder cancer ([place])   . BPH (benign prostatic hyperplasia)   . GERD (gastroesophageal reflux disease)   . Gout   . Hypertension   . Sleep apnea    CPAP-in use q night, last study 5 yrs. ago  . Spinal stenosis of lumbar region with radiculopathy    Past Surgical History:  Past Surgical History:  Procedure Laterality Date  . BLADDER TUMOR EXCISION  2017   several cystocscopy for the excision followed by M. Annitta Needs  . BLEPHAROPLASTY Bilateral 2017   in [place]  . CYSTOSCOPY    . HEMATOMA EVACUATION N/A 07/04/2018   Procedure: Lumbar wound exploration and EVACUATION OF EPIDURAL HEMATOMA;  Surgeon: Newman Pies, MD;  Location: [place];  Service: Neurosurgery;  Laterality: N/A;  . JOINT REPLACEMENT    . KNEE ARTHROSCOPY     2 on each side, one being a repair of the meniscus     . LUMBAR LAMINECTOMY/DECOMPRESSION MICRODISCECTOMY N/A 02/24/2018   Procedure: Lumbar Four-Five Laminectomy/Foraminotomy;  Surgeon: Ashok Pall, MD;  Location: [place];  Service: Neurosurgery;  Laterality: N/A;  . SHOULDER ARTHROSCOPY Right 2016  . TONSILLECTOMY    . TOTAL KNEE ARTHROPLASTY Right 12/08/2016   Procedure: TOTAL KNEE  ARTHROPLASTY;  Surgeon: Vickey Huger, MD;  Location: [place];  Service: Orthopedics;  Laterality: Right;   Social History:  reports that he quit smoking about 15 years ago. He has never used smokeless tobacco. He reports current alcohol use of about 2.0 - 3.0 standard drinks of alcohol per week. He reports that he does not use drugs.  Family / Support Systems Marital Status: Divorced How Long?: "A long time" Patient Roles: Partner, Parent Spouse/Significant Other: girlfriend (together 37 + yrs), Patina Kidd @ (C) [phone] Children: daughter, Nadara Eaton [facility]) @ [phone] and son, Elta [employer]) Anticipated Caregiver: girlfriend and daughter intermittently Ability/Limitations of Caregiver: work during the day. Patina in [place] and [place]by in [place] - have stressed that initial goals have been set for supervision and daughter states they will try to coordinate Caregiver Availability: Intermittent Family Dynamics: Pt's daughter notes very good relationship between pt, adult children and pt's girlfriend and all willing to work together to provide any needed assistance.    Social History Preferred language: English Religion: Lutheran Cultural Background: NA Education: college Read: Yes Write: Yes Employment Status: Retired Public relations account executive Issues: None Guardian/Conservator: None - per MD, pt is not yet fully capable of making decisions on his own behalf - defer to daughter.   Abuse/Neglect Abuse/Neglect Assessment Can Be Completed: Yes Physical Abuse: Denies Verbal Abuse: Denies Sexual Abuse: Denies Exploitation of patient/patient's resources: Denies Self-Neglect: Denies  Emotional Status Pt's affect, behavior and adjustment status: Pt lying  in bed and attempts to provide answers to assessment questions, however, daughter adding clarifications as needed.  He does appear somewhat confused and disconnected in some way which could be med related?  Daughter notes NO hx  of confusion prior to surgery in October.  Cannot assess mood but will monitor and refer for neuropsychology as indicated. Recent Psychosocial Issues: Initial surgery in Oct and was doing "pretty good" initially but never nearing baseline function and then "re-do" surgery needed. Psychiatric History: None Substance Abuse History: None  Patient / Family Perceptions, Expectations & Goals Pt/Family understanding of illness & functional limitations: As noted, pt with confusion but does seem to have general awareness of the complications he has had since surgeries. Premorbid pt/family roles/activities: Prior to Oct, pt completely independent and active. Anticipated changes in roles/activities/participation: Dependent on confusion clearing, gf and family may need to provide more support than anticipated if 24/7 supervision needed."I  Pt/family expectations/goals: "I just want to be more comfortable." (pt)  Daughter hopeful his confusion will clear and he will not need 24/7 supervision.  Community Resources [employer] Scripts: None Premorbid Home Care/DME Agencies: None Transportation available at discharge: yes Resource referrals recommended: Neuropsychology  Discharge Planning Living Arrangements: Alone Support Systems: Children, Spouse/significant other Type of Residence: Private residence Insurance Resources: [employer], Multimedia programmer (specify)([employer]) Financial Screen Referred: No Living Expenses: Own Money Management: Patient Does the patient have any problems obtaining your medications?: No Home Management: pt Patient/Family Preliminary Plans: Pt to return to his home with gf and family able to provide intermittnet support and aware may need to increase this to 24/7 initially dependent on pt's mental clearing. Social Work Anticipated Follow Up Needs: HH/OP Expected length of stay: 12-16 days  Clinical Impression Unfortunate gentleman here due to hematoma developing following a 2nd  back surgery and evacuation needed.  Now with physical limitations and confusion and MD hopeful cognition will clear over the stay on CIR.  Daughter very supportive and notes she and pt's girlfriend are prepared to provide intermittent support and may be able to increase to 24/7 if needed but not long-term.  Will follow for support and d/c planning needs.  Deliana Avalos 07/09/2018, 4:03 PM

## 2018-07-09 NOTE — Discharge Summary (Deleted)
  The note originally documented on this encounter has been moved the the encounter in which it belongs.  

## 2018-07-09 NOTE — Progress Notes (Signed)
RT offered pt CPAP for the night and patient declined. RT will continue to monitor.

## 2018-07-09 NOTE — Progress Notes (Signed)
[facility] PHYSICAL MEDICINE & REHABILITATION PROGRESS NOTE  Subjective/Complaints: Patient seen sitting up in bed this morning.  He states he did not sleep well overnight but cannot identify reason why.  He states he is ready to begin therapies today.  ROS: Denies CP, shortness of breath, nausea, vomiting, diarrhea.  Objective: Vital Signs: Blood pressure 131/69, pulse 86, temperature 98.1 F (36.7 C), temperature source Oral, resp. rate 16, height 5\' 11"  (1.803 m), weight 91.9 kg, SpO2 93 %. No results found. Recent Labs    07/09/18 0531  WBC 9.6  HGB 13.0  HCT 37.3*  PLT 255   Recent Labs    07/09/18 0531  NA 135  K 3.7  CL 96*  CO2 29  GLUCOSE 140*  BUN 20  CREATININE 0.96  CALCIUM 9.4    Physical Exam: BP 131/69 (BP Location: Left Arm)   Pulse 86   Temp 98.1 F (36.7 C) (Oral)   Resp 16   Ht 5\' 11"  (1.803 m)   Wt 91.9 kg   SpO2 93%   BMI 28.26 kg/m  Constitutional: No distress . Vital signs reviewed. HENT: Normocephalic.  Atraumatic. Eyes: EOMI. No discharge. Cardiovascular: RRR. No JVD. Respiratory: CTA Bilaterally. Normal effort. GI: BS +. Non-distended. Musc: No edema or tenderness in extremities. Neuro: Alert  Able to follow simple motor commands--slow movements. Motor: Grossly 4+/5 throughout  Skin: Back with dressing C/D/I Psych: Normal mood.  Normal affect.  Assessment/Plan: 1. Functional deficits secondary to encephalopathy status post epidural hematoma with laminectomy which require 3+ hours per day of interdisciplinary therapy in a comprehensive inpatient rehab setting.  Physiatrist is providing close team supervision and 24 hour management of active medical problems listed below.  Physiatrist and rehab team continue to assess barriers to discharge/monitor patient progress toward functional and medical goals  Care Tool:  Bathing              Bathing assist       Upper Body Dressing/Undressing Upper body dressing         Upper body assist      Lower Body Dressing/Undressing Lower body dressing            Lower body assist       Toileting Toileting    Toileting assist Assist for toileting: Moderate Assistance - Patient 50 - 74%     Transfers Chair/bed transfer  Transfers assist           Locomotion Ambulation   Ambulation assist              Walk 10 feet activity   Assist           Walk 50 feet activity   Assist           Walk 150 feet activity   Assist           Walk 10 feet on uneven surface  activity   Assist           Wheelchair     Assist               Wheelchair 50 feet with 2 turns activity    Assist            Wheelchair 150 feet activity     Assist            Medical Problem List and Plan: 1. Debility and cognitive deficits secondary to Epidural hematoma, post op lumbar laminectomy with encephalopathy  Begin CIR  Notes reviewed, labs reviewed 2. DVT Prophylaxis/Anticoagulation: Mechanical:Sequential compression devices, below kneeBilateral lower extremities 3. Pain Management:Tylenol as needed for now. Local measures with heat and/or ice.  4. Mood:LCSW to follow for evaluation and support. 5. Neuropsych: This patientis not fullycapable of making decisions onhisown behalf. 6. Skin/Wound Care:Monitor wound for healing and for any recurrent drainage. Monitor for signs of infection. 7. Fluids/Electrolytes/Nutrition:Monitor I's and O's. Offer nutritional supplements as with variable p.o. intake  BMP within acceptable range on 2/21, labs ordered for Monday 8. Delirium: Not fully resolved. Continue to monitor.  Discontinued Norco and flexeril--has not had any in >24 hours.   UA negative, urine culture pending.   Monitor for other signs of infection.  9.HTN: Monitor blood pressures twice daily. Continue Cozaar, Toprol-XL and amlodipine  Monitor with increased mobility 10.History  of bladder cancer/BPH:Recent episode of urinary retention. Monitor voiding with cath for volumes greater than 350 cc.  PVR suggesting retention, will consider medication adjustments  Flomax started 11.Epidural hematoma/ABLA: Monitor for signs of bleeding  Hemoglobin within normal limits on 2/21 12.GERD: Continue Protonix 13.  Hyperglycemia  Elevated on 2/21  Hemoglobin A1c ordered for Monday 14.  Hypoalbuminemia  Supplement initiated on 2/21 15.  Transaminitis  LFTs elevated on 2/21  Avoid hepatotoxic meds  Labs ordered for Monday  LOS: 1 days A FACE TO FACE EVALUATION WAS PERFORMED  Judaea Burgoon Lorie Phenix 07/09/2018, 8:47 AM

## 2018-07-09 NOTE — Care Management (Signed)
[facility] Individual Statement of Services  Patient Name:  William [name]  Date:  07/09/2018  Welcome to the [facility].  Our goal is to provide you with an individualized program based on your diagnosis and situation, designed to meet your specific needs.  With this comprehensive rehabilitation program, you will be expected to participate in at least 3 hours of rehabilitation therapies Monday-Friday, with modified therapy programming on the weekends.  Your rehabilitation program will include the following services:  Physical Therapy (PT), Occupational Therapy (OT), Speech Therapy (ST), 24 hour per day rehabilitation nursing, Therapeutic Recreaction (TR), Neuropsychology, Case Management (Social Worker), Rehabilitation Medicine, Nutrition Services and Pharmacy Services  Weekly team conferences will be held on Wednesdays to discuss your progress.  Your Social Worker will talk with you frequently to get your input and to update you on team discussions.  Team conferences with you and your family in attendance may also be held.  Expected length of stay: 12-16 days   Overall anticipated outcome: supervision  Depending on your progress and recovery, your program may change. Your Social Worker will coordinate services and will keep you informed of any changes. Your Social Worker's name and contact numbers are listed  below.  The following services may also be recommended but are not provided by the [facility] will be made to provide these services after discharge if needed.  Arrangements include referral to agencies that provide these services.  Your insurance has been verified to be:  Medicare (A); [place] Your primary doctor is:  Humphrey Rolls  Pertinent information will be shared with your doctor and your insurance  company.  Social Worker:  [place], [place] or ([phone]   Information discussed with and copy given to patient by: Lennart Pall, 07/09/2018, 3:31 PM

## 2018-07-09 NOTE — Evaluation (Signed)
Speech Language Pathology Assessment and Plan  Patient Details  Name: William [name] MRN: [id number] Date of Birth: [birth year]  SLP Diagnosis: Cognitive Impairments  Rehab Potential: Good ELOS: 12-14 days    Today's Date: 07/09/2018 SLP Individual Time: 1000-1052 SLP Individual Time Calculation (min): 52 min   Problem List:  Patient Active Problem List   Diagnosis Date Noted  . Transaminitis   . Hypoalbuminemia due to protein-calorie malnutrition ([place])   . Hyperglycemia   . Acute delirium   . Urinary incontinence due to benign prostatic hyperplasia 07/08/2018  . Spinal stenosis of lumbar region with radiculopathy 07/08/2018  . Epidural hematoma ([place]) 07/05/2018  . Acute urinary retention 07/04/2018  . Lumbar stenosis with neurogenic claudication 02/24/2018  . S/P total knee replacement 12/08/2016   Past Medical History:  Past Medical History:  Diagnosis Date  . Arthritis    OA- knees   . Bell's palsy 2014  . Bladder cancer ([place])   . BPH (benign prostatic hyperplasia)   . GERD (gastroesophageal reflux disease)   . Gout   . Hypertension   . Sleep apnea    CPAP-in use q night, last study 5 yrs. ago  . Spinal stenosis of lumbar region with radiculopathy    Past Surgical History:  Past Surgical History:  Procedure Laterality Date  . BLADDER TUMOR EXCISION  2017   several cystocscopy for the excision followed by M. Annitta Needs  . BLEPHAROPLASTY Bilateral 2017   in [place]  . CYSTOSCOPY    . HEMATOMA EVACUATION N/A 07/04/2018   Procedure: Lumbar wound exploration and EVACUATION OF EPIDURAL HEMATOMA;  Surgeon: Newman Pies, MD;  Location: [facility];  Service: Neurosurgery;  Laterality: N/A;  . JOINT REPLACEMENT    . KNEE ARTHROSCOPY     2 on each side, one being a repair of the meniscus     . LUMBAR LAMINECTOMY/DECOMPRESSION MICRODISCECTOMY N/A 02/24/2018   Procedure: Lumbar Four-Five Laminectomy/Foraminotomy;  Surgeon: Ashok Pall, MD;  Location: [facility];   Service: Neurosurgery;  Laterality: N/A;  . SHOULDER ARTHROSCOPY Right 2016  . TONSILLECTOMY    . TOTAL KNEE ARTHROPLASTY Right 12/08/2016   Procedure: TOTAL KNEE ARTHROPLASTY;  Surgeon: Vickey Huger, MD;  Location: [facility];  Service: Orthopedics;  Laterality: Right;    Assessment / Plan / Recommendation Clinical Impression   William [name] is a 68 year old male with history of HTN, bladder cancer, BPH, lumbar stenosis with radiculopathy s/p decompression 02/2018 and recent surgery few days PTA to [facility] on 07/05/18 with multiple falls, fever, blood drainage from the wound and urinary retention. He was treated with IV antibiotics in ED and transferred to [facility] for treatment. NS felt thatXraysnot neededfor work upas patient likely with hematoma leading to symptoms.He was taken to OR for I &D with evacuation of epidural hematoma by Dr. Arnoldo Morale. Wound cultures with rare WBC and no growth so far. He had issues with lethargy and confusion post op felt to be due to oxycodone therefore this was discontinued with improvement in mentation. Pt was admitted to CIR on 07/08/18 and SLP evaluation was completed on 07/09/18 with results as follows:  Pt presents with fluctuating moderate to severe cognitive deficits primarily in the areas of attention, short term memory, problem solving, comprehension, and awareness. Pt was appropriate upon greeting and answered simple questions regarding history and independent prior functioning. He was unable to identify deficits beyond physical impairments when questioned and expressed doubt that he had any speech therapy needs. Pt became  increasingly confused and fatigued throughout session, quickly requiring max assist verbal/visual cues for to redirection to sustain attention for less than 1 minute intervals. Pt also require max assist verbal and visual cues to complete basic verbal and visuospatial problem solving and delayed recall subtests from the [facility].  Pt's comprehension also appears decreased (suspect secondary to cognition), as he needed several repetitions of instructions to complete tasks. Pt would benefit from skilled ST to facilitate greatest safety and functional independence upon discharge.   Skilled Therapeutic Interventions          Cognitive-linguistic evaluation was completed and results (above) were reviewed with pt.   SLP Assessment  Patient will need skilled [facility] during CIR admission    Recommendations  Follow up Recommendations: Home Health SLP;24 hour supervision/assistance Equipment Recommended: None recommended by SLP    SLP Frequency 3 to 5 out of 7 days   SLP Duration  SLP Intensity  SLP Treatment/Interventions 12-14 days  Minumum of 1-2 x/day, 30 to 90 minutes  Cognitive remediation/compensation;Environmental controls;Cueing hierarchy;Functional tasks;Internal/external aids;Patient/family education    Pain Pain Assessment Pain Scale: 0-10 Pain Score: 0-No pain Faces Pain Scale: Hurts a little bit Pain Type: Acute pain Pain Location: Back Pain Descriptors / Indicators: Discomfort Pain Intervention(s): RN made aware;Emotional support Multiple Pain Sites: No  Prior Functioning Cognitive/Linguistic Baseline: Within functional limits Type of Home: House  Lives With: Alone Available Help at Discharge: Family;Available PRN/intermittently Vocation: Other (Comment)(unclear - 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 SLP Short Term Goal 2 (Week 1): Pt will sustain attention to tasks for at least 10 minutes with mod assist verbal cues for redirection.  SLP Short Term Goal 3 (Week 1): Pt will demonstrate intellectual awareness by identifying 2 of his current deficits with mod assist. SLP Short Term Goal  4 (Week 1): Pt will recall information with mod assist to utilize compensatory memory strategies/aids. SLP Short Term Goal 5 (Week 1): Pt will follow at least 2-step directions to complete functional tasks with no more than 2 repetitions from clinician.   Refer to Care Plan for Long Term Goals  Recommendations for other services: None   Discharge Criteria: Patient will be discharged from SLP if patient refuses treatment 3 consecutive times without medical reason, if treatment goals not met, if there is a change in medical status, if patient makes no progress towards goals or if patient is discharged from hospital.  The above assessment, treatment plan, treatment alternatives and goals were discussed and mutually agreed upon: by patient   Jettie Booze, Student SLP   Jettie Booze 07/09/2018, 3:37 PM

## 2018-07-09 NOTE — Evaluation (Signed)
Physical Therapy Assessment and Plan  Patient Details  Name: William [name] MRN: [id number] Date of Birth: 24-Oct-1950  PT Diagnosis: Abnormal posture, Abnormality of gait, Ataxia, Impaired cognition, Impaired sensation, Low back pain and Muscle spasms Rehab Potential: Good ELOS: 14-16days    Today's Date: 07/09/2018 PT Individual Time: 0805-0920 PT Individual Time Calculation (min): 75 min    Problem List:  Patient Active Problem List   Diagnosis Date Noted  . Transaminitis   . Hypoalbuminemia due to protein-calorie malnutrition ([place])   . Hyperglycemia   . Acute delirium   . Urinary incontinence due to benign prostatic hyperplasia 07/08/2018  . Spinal stenosis of lumbar region with radiculopathy 07/08/2018  . Epidural hematoma ([place]) 07/05/2018  . Acute urinary retention 07/04/2018  . Lumbar stenosis with neurogenic claudication 02/24/2018  . S/P total knee replacement 12/08/2016    Past Medical History:  Past Medical History:  Diagnosis Date  . Arthritis    OA- knees   . Bell's palsy 2014  . Bladder cancer ([place])   . BPH (benign prostatic hyperplasia)   . GERD (gastroesophageal reflux disease)   . Gout   . Hypertension   . Sleep apnea    CPAP-in use q night, last study 5 yrs. ago  . Spinal stenosis of lumbar region with radiculopathy    Past Surgical History:  Past Surgical History:  Procedure Laterality Date  . BLADDER TUMOR EXCISION  2017   several cystocscopy for the excision followed by M. Annitta Needs  . BLEPHAROPLASTY Bilateral 2017   in [place]  . CYSTOSCOPY    . HEMATOMA EVACUATION N/A 07/04/2018   Procedure: Lumbar wound exploration and EVACUATION OF EPIDURAL HEMATOMA;  Surgeon: Newman Pies, MD;  Location: [place];  Service: Neurosurgery;  Laterality: N/A;  . JOINT REPLACEMENT    . KNEE ARTHROSCOPY     2 on each side, one being a repair of the meniscus     . LUMBAR LAMINECTOMY/DECOMPRESSION MICRODISCECTOMY N/A 02/24/2018   Procedure: Lumbar  Four-Five Laminectomy/Foraminotomy;  Surgeon: Ashok Pall, MD;  Location: [facility];  Service: Neurosurgery;  Laterality: N/A;  . SHOULDER ARTHROSCOPY Right 2016  . TONSILLECTOMY    . TOTAL KNEE ARTHROPLASTY Right 12/08/2016   Procedure: TOTAL KNEE ARTHROPLASTY;  Surgeon: Vickey Huger, MD;  Location: [facility];  Service: Orthopedics;  Laterality: Right;    Assessment & Plan Clinical Impression: Patient is a 68 year old male with history of HTN, bladder cancer, BPH, lumbar stenosis with radiculopathy s/p decompression 02/2018 and recent surgery few days PTA to [facility] on 07/05/18 with multiple falls, fever, blood drainage from the wound and urinary retention. He was treated with IV antibiotics in ED and transferred to [facility] for treatment. NS felt thatXraysnot neededfor work upas patient likely with hematoma leading to symptoms.He was taken to OR for I &D with evacuation of epidural hematoma by Dr. Arnoldo Morale. Wound cultures with rare WBC and no growth so far. He had issues with lethargy and confusion post op felt to be due to oxycodone therefore this was discontinued with improvement in mentation Patient transferred to CIR on 07/08/2018 .   Patient currently requires max with mobility secondary to muscle joint tightness, unbalanced muscle activation, ataxia and decreased coordination, decreased attention, decreased awareness, decreased problem solving, decreased safety awareness, decreased memory and delayed processing and decreased sitting balance, decreased standing balance, decreased postural control, decreased balance strategies and difficulty maintaining precautions.  Prior to hospitalization, patient was modified independent  with mobility and lived with  Alone in a House home.  Home access is 1Stairs to enter.  Patient will benefit from skilled PT intervention to maximize safe functional mobility, minimize fall risk and decrease caregiver burden for planned discharge home with 24 hour  supervision.  Anticipate patient will benefit from follow up [place] at discharge.  PT - End of Session Activity Tolerance: Tolerates < 10 min activity, no significant change in vital signs Endurance Deficit: Yes PT Assessment Rehab Potential (ACUTE/IP ONLY): Good PT Barriers to Discharge: Inaccessible home environment;Decreased caregiver support;Medical stability;Lack of/limited family support;Behavior;Medication compliance PT Patient demonstrates impairments in the following area(s): Balance;Behavior;Edema;Endurance;Motor;Pain;Safety;Sensory;Skin Integrity PT Transfers Functional Problem(s): Bed Mobility;Bed to Chair;Car;Furniture;Floor PT Locomotion Functional Problem(s): Ambulation;Wheelchair Mobility;Stairs PT Plan PT Intensity: Minimum of 1-2 x/day ,45 to 90 minutes PT Frequency: 5 out of 7 days PT Duration Estimated Length of Stay: 14-16days  PT Treatment/Interventions: Ambulation/gait training;Cognitive remediation/compensation;Discharge planning;DME/adaptive equipment instruction;Functional mobility training;Pain management;Psychosocial support;Splinting/orthotics;Therapeutic Activities;UE/LE Strength taining/ROM;Visual/perceptual remediation/compensation;Balance/vestibular training;Community reintegration;Disease management/prevention;Neuromuscular re-education;Patient/family education;Skin care/wound management;Therapeutic Exercise;Stair training;UE/LE Coordination activities;Wheelchair propulsion/positioning PT Transfers Anticipated Outcome(s): Supervision assist with LRAD  PT Locomotion Anticipated Outcome(s): Ambulatory with LRAD at supervision assist  PT Recommendation Recommendations for Other Services: Therapeutic Recreation consult Therapeutic Recreation Interventions: Stress management;Outing/community reintergration Follow Up Recommendations: Home health PT Patient destination: Home Equipment Recommended: Wheelchair cushion (measurements);Wheelchair (measurements)  Skilled  Therapeutic Intervention Pt received supine in bed and agreeable to PT. Supine>sit transfer with mod assist and max cues for technique. PT instructed patient in PT Evaluation and initiated treatment intervention; see below for results. PT educated patient in [place], rehab potential, rehab goals, and discharge recommendations. Car transfer  And stand pivot transfers completed with max assist to prevent posterior LOB. WC mobility x 143f with mod assist due to poor attention to task and poor coordination movements. Gait training with BUE support on therapist with max assist due to RLE ataxia, fatigue, and shuffling gait x 8 ft as listed below. Patient returned to room and [facility] and all needs met.        PT Evaluation Precautions/Restrictions   back  General   Vital Signs  Pain Pain Assessment Pain Scale: 0-10 Pain Score: 5  Pain Type: Surgical pain Pain Location: Back Pain Orientation: Lower Pain Descriptors / Indicators: Aching Pain Frequency: Intermittent Pain Onset: Gradual Patients Stated Pain Goal: 0 Pain Intervention(s): Medication (See eMAR) Multiple Pain Sites: No Home Living/Prior Functioning Home Living Available Help at Discharge: Family;Available PRN/intermittently Type of Home: House Home Access: Stairs to enter ECenter[employer] Energyof Steps: 1 Entrance Stairs-Rails: None Home Layout: Two level;Able to live on main level with bedroom/bathroom(office and computer in basement. ) Alternate Level Stairs-Number of Steps: flight Alternate Level Stairs-Rails: Right(up on and L ) Bathroom Shower/Tub: Tub/shower unit Bathroom Toilet: Standard  Lives With: Alone Prior Function Level of Independence: Requires assistive device for independence(has been using RW since back surgey in Oct 2019. indep prior to surgery)  Able to Take Stairs?: Yes Driving: Yes Vocation: Retired Comments: PLOF prior to initial sugery in Oct 19:  still driving,  enjoys spending time in pool, had trouble dressing prior to sx; since surgery on 2/12, LE weakness, multiple falls Vision/Perception  Perception Perception: Within Functional Limits Praxis Praxis: Intact  Cognition Orientation Level: Oriented to person;Disoriented to place;Disoriented to situation;Oriented to time Memory: Impaired Awareness: Impaired Problem Solving: Impaired Safety/Judgment: Impaired Sensation Sensation Light Touch: Impaired Detail Peripheral sensation comments: inconsistnent appreciation to light touch BLE in the L>R Light Touch Impaired  Details: Impaired RLE;Impaired LLE Proprioception: Impaired Detail Proprioception Impaired Details: Impaired RLE;Impaired LLE(R>L) Coordination Gross Motor Movements are Fluid and Coordinated: No Heel Shin Test: ataxia on the R, dysmetria on the L  Motor  Motor Motor: Ataxia Motor - Skilled Clinical Observations: BLE ataxia R>L  Mobility Bed Mobility Bed Mobility: Sit to Supine;Supine to Sit Supine to Sit: Moderate Assistance - Patient 50-74% Transfers Transfers: Sit to Stand;Squat Pivot Transfers;Stand Pivot Transfers Sit to Stand: Moderate Assistance - Patient 50-74% Stand Pivot Transfers: Maximal Assistance - Patient 25 - 49% Squat Pivot Transfers: Moderate Assistance - Patient 50-74% Locomotion  Gait Ambulation: Yes Gait Assistance: 2 Helpers Gait Distance (Feet): 8 Feet Assistive device: 1 person hand held assist;None Gait Assistance Details: Manual facilitation for weight shifting;Verbal cues for precautions/safety;Verbal cues for technique;Verbal cues for sequencing Gait Gait: Yes Gait Pattern: Ataxic;Shuffle;Wide base of support Stairs / Additional Locomotion Stairs: No Architect: Yes Wheelchair Assistance: Moderate Assistance - Patient 50 - 74% Wheelchair Propulsion: Both upper extremities Wheelchair Parts Management: Needs assistance Distance: 172f  Trunk/Postural  Assessment  Cervical Assessment Cervical Assessment: Within Functional Limits Thoracic Assessment Thoracic Assessment: Within Functional Limits Lumbar Assessment Lumbar Assessment: Exceptions to [facility] HospitalPostural Control Postural Control: Deficits on evaluation(constant posterior LOB)  Balance Balance Balance Assessed: Yes Static Sitting Balance Static Sitting - Level of Assistance: 3: Mod assist Dynamic Sitting Balance Dynamic Sitting - Level of Assistance: 2: Max assist Sitting balance - Comments: constant posterior Lean. able to correct for up to 10 sec, then mod-max assist to correct.  Static Standing Balance Static Standing - Level of Assistance: 3: Mod assist Dynamic Standing Balance Dynamic Standing - Level of Assistance: 2: Max assist Dynamic Standing - Comments: max assist to prevent posterior LOB with and without UE support on RW  Extremity Assessment      RLE Assessment RLE Assessment: Exceptions to [facility] Center[facility] of Motion (AROM) Comments: ataxic RLE Strength RLE Overall Strength Comments: 4+/5 to 5/5 LLE Assessment LLE Assessment: Exceptions to [facility]Un[facility] HospitalActive Range of Motion (AROM) Comments: mild dysmetria General Strength Comments: 4+/5 to 5/5     Refer to Care Plan for Long Term Goals  Recommendations for other services: Therapeutic Recreation  Outing/community reintegration  Discharge Criteria: Patient will be discharged from PT if patient refuses treatment 3 consecutive times without medical reason, if treatment goals not met, if there is a change in medical status, if patient makes no progress towards goals or if patient is discharged from hospital.  The above assessment, treatment plan, treatment alternatives and goals were discussed and mutually agreed upon: by patient  ALorie Phenix2/21/2020, 9:21 AM

## 2018-07-09 NOTE — Progress Notes (Signed)
Per nursing, patient was given "Data Collection Information Summary for Patients in Inpatient Rehabilitation Facilities with attached [place] Records" upon admission, which was provided in the patient education notebook.    Patient information reviewed and entered into eRehab System by Ileana Ladd, [place] coordinator. Information including medical coding, function ability, and quality indicators will be reviewed and updated through discharge.

## 2018-07-10 ENCOUNTER — Inpatient Hospital Stay (HOSPITAL_COMMUNITY): Payer: Federal, State, Local not specified - PPO | Admitting: Physical Therapy

## 2018-07-10 ENCOUNTER — Inpatient Hospital Stay (HOSPITAL_COMMUNITY): Payer: Federal, State, Local not specified - PPO | Admitting: Occupational Therapy

## 2018-07-10 LAB — URINE CULTURE: Culture: >=100000 — AB

## 2018-07-10 NOTE — Progress Notes (Signed)
Physical Therapy Session Note  Patient Details  Name: William [name] MRN: [id number] Date of Birth: [birth year]  Today's Date: 07/10/2018 PT Individual Time: 1003-1053 PT Individual Time Calculation (min): 50 min   Short Term Goals: Week 1:  PT Short Term Goal 1 (Week 1): Pt will sit EOB with supervision assist from PT PT Short Term Goal 2 (Week 1): PT will ambulate 61ft with mod assist and LRAD  PT Short Term Goal 3 (Week 1): Pt will propell WC 147ft with supevisoin assist  PT Short Term Goal 4 (Week 1): Pt will initiate stain management training   Skilled Therapeutic Interventions/Progress Updates:  Pt was seen bedside in the am. Pt transferred supine to edge with side rail and min to mod A with increased cues required. Donned brace at edge of bed. Pt transferred sit to stand with min to mod and verbal cues. Pt ambulated from edge of bed to bathroom about 10 feet with mod A and multiple verbal cues with increased difficulty transitioning from one floor surface to the next. Pt performed toilet transfers with min to mod A and verbal cues. Pt ambulated back to edge of bed. As pt was ambulating around end of bed and beginning to transition to edge of bed, noted increased confusion requiring multiple verbal cues to sit on edge of bed. Pt then transferred from edge of bed to w/c with mod A and multiple verbal cues. Pt transported to rehab gym. Pt transferred from w/c to Nustep with min to mod A and verbal cues with increased time. Pt rode Nustep x 5 minutes at level 3 with constant verbal cues required to remain focused on task. Pt transferred back to w/c with mod A and verbal cues. Pt returned to room. Pt transferred w/c to edge of bed with mod A and verbal cues. Pt transferred edge of bed to supine with mod A and verbal cues. Vitals taken. Pt with increased confusion as compared with treatment earlier in the morning. Pt left in bed with bed alarm on and notified pt's nurse of increased confusion as  compared with 8 am treatment.   Therapy Documentation Precautions:  Precautions Precautions: Back, Fall Required Braces or Orthoses: Spinal Brace Spinal Brace: Applied in sitting position Restrictions Weight Bearing Restrictions: No General: PT Amount of Missed Time (min): 10 Minutes PT Missed Treatment Reason: Patient fatigue;Other (Comment) Vital Signs: Therapy Vitals Pulse Rate: 80 BP: 101/62 Patient Position (if appropriate): Lying Oxygen Therapy SpO2: 97 % O2 Device: Room Air Pain: Pt c/o mod back pain.    Therapy/Group: Individual Therapy  Juwuan, Sedita 07/10/2018, 12:24 PM

## 2018-07-10 NOTE — Progress Notes (Signed)
William [name] is a 68 y.o. male 29-May-1950 [id number]  Subjective: No new complaints. No new problems: back pain unchanged. Slept well. Feeling OK.  Objective: Vital signs in last 24 hours: Temp:  [97.6 F (36.4 C)-98.4 F (36.9 C)] 97.6 F (36.4 C) (02/22 0421) Pulse Rate:  [80-89] 80 (02/22 1048) Resp:  [14-18] 18 (02/22 0421) BP: (101-150)/(57-73) 101/62 (02/22 1048) SpO2:  [93 %-97 %] 97 % (02/22 1048) Weight change:  Last BM Date: 07/07/18  Intake/Output from previous day: 02/21 0701 - 02/22 0700 In: 802 [P.O.:802] Out: 1850 [Urine:1850]  Physical Exam General: No apparent distress    Lungs: Normal effort. Lungs clear to auscultation, no crackles or wheezes. Cardiovascular: Regular rate and rhythm, no edema   Lab Results: BMET    Component Value Date/Time   NA 135 07/09/2018 0531   K 3.7 07/09/2018 0531   CL 96 (L) 07/09/2018 0531   CO2 29 07/09/2018 0531   GLUCOSE 140 (H) 07/09/2018 0531   BUN 20 07/09/2018 0531   CREATININE 0.96 07/09/2018 0531   CALCIUM 9.4 07/09/2018 0531   GFRNONAA >60 07/09/2018 0531   GFRAA >60 07/09/2018 0531   CBC    Component Value Date/Time   WBC 9.6 07/09/2018 0531   RBC 4.35 07/09/2018 0531   HGB 13.0 07/09/2018 0531   HCT 37.3 (L) 07/09/2018 0531   PLT 255 07/09/2018 0531   MCV 85.7 07/09/2018 0531   MCH 29.9 07/09/2018 0531   MCHC 34.9 07/09/2018 0531   RDW 11.6 07/09/2018 0531   LYMPHSABS 1.9 07/09/2018 0531   MONOABS 0.8 07/09/2018 0531   EOSABS 0.2 07/09/2018 0531   BASOSABS 0.0 07/09/2018 0531   CBG's (last 3):  No results for input(s): GLUCAP in the last 72 hours. LFT's Lab Results  Component Value Date   ALT 123 (H) 07/09/2018   AST 51 (H) 07/09/2018   ALKPHOS 66 07/09/2018   BILITOT 0.6 07/09/2018    Studies/Results: No results found.  Medications:  I have reviewed the patient's current medications. Scheduled Medications: . allopurinol  100 mg Oral Daily  . amLODipine  5 mg Oral Daily  .  atorvastatin  10 mg Oral q1800  . docusate sodium  100 mg Oral BID  . feeding supplement (PRO-STAT SUGAR FREE 64)  30 mL Oral BID  . losartan  100 mg Oral Daily   And  . hydrochlorothiazide  12.5 mg Oral Daily  . loratadine  10 mg Oral Daily  . magnesium oxide  400 mg Oral BID  . metoprolol succinate  100 mg Oral Daily  . pantoprazole  40 mg Oral QAC breakfast  . tamsulosin  0.4 mg Oral BID   PRN Medications: acetaminophen, alum & mag hydroxide-simeth, bisacodyl, diphenhydrAMINE, HYDROcodone-acetaminophen, lidocaine, menthol-cetylpyridinium **OR** phenol, polyethylene glycol, polyvinyl alcohol, prochlorperazine **OR** prochlorperazine **OR** prochlorperazine, sodium phosphate, traMADol, traZODone  Assessment/Plan: Principal Problem:   Epidural hematoma (HCC) Active Problems:   Lumbar stenosis with neurogenic claudication   Urinary incontinence due to benign prostatic hyperplasia   Spinal stenosis of lumbar region with radiculopathy   Transaminitis   Hypoalbuminemia due to protein-calorie malnutrition ([place])   Hyperglycemia   Acute delirium   1. Debility following epidural hematoma complication of postop laminectomy - continued back pain. Continue CIR therapies for strengthening as planned. 2. Delirium related to #1 - other neuro or systemic issues without evidence for infx or imbalance. Minimize meds with potential cognitive side effects like opioids or central acting muscle relaxants 3. Urinary retention - hx  uro issues including bladder ca and BPH - now on flomax - monitor PVRs 4. HTN - med mgmt as ongoing 5. Hyperglycemia, random - pending a1c order for Mon  Length of stay, days: 2  William [name] A. Asa Lente, MD 07/10/2018, 11:47 AM

## 2018-07-10 NOTE — Plan of Care (Signed)
  Problem: Consults Goal: Skin Care Protocol Initiated - if Braden Score 18 or less Description If consults are not indicated, leave blank or document N/A Outcome: Progressing Goal: RH GENERAL PATIENT EDUCATION Description See Patient Education module for education specifics. Outcome: Progressing   Problem: RH BOWEL ELIMINATION Goal: RH STG MANAGE BOWEL WITH ASSISTANCE Description STG Manage Bowel with Min. Assistance.  Outcome: Progressing Goal: RH STG MANAGE BOWEL W/MEDICATION W/ASSISTANCE Description STG Manage Bowel with Medication with Min.Assistance.  Outcome: Progressing   Problem: RH SKIN INTEGRITY Goal: RH STG SKIN FREE OF INFECTION/BREAKDOWN Description With min. Assist.  Outcome: Progressing Goal: RH STG MAINTAIN SKIN INTEGRITY WITH ASSISTANCE Description STG Maintain Skin Integrity With min.Assistance.  Outcome: Progressing Goal: RH STG ABLE TO PERFORM INCISION/WOUND CARE W/ASSISTANCE Description STG Able To Perform Incision/Wound Care With mod. Assistance.  Outcome: Progressing   Problem: RH PAIN MANAGEMENT Goal: RH STG PAIN MANAGED AT OR BELOW PT'S PAIN GOAL Description Less than 3  Outcome: Progressing   Problem: RH KNOWLEDGE DEFICIT GENERAL Goal: RH STG INCREASE KNOWLEDGE OF SELF CARE AFTER HOSPITALIZATION Description Pt. Will be able to verbalized the safety precautions to prevent complication with his back surgery.  Outcome: Progressing   Problem: RH BLADDER ELIMINATION Goal: RH STG MANAGE BLADDER WITH ASSISTANCE Description STG Manage Bladder With Mod.Assistance  Outcome: Not Progressing   Problem: RH SAFETY Goal: RH STG ADHERE TO SAFETY PRECAUTIONS W/ASSISTANCE/DEVICE Description STG Adhere to Safety Precautions With mod. Assistance/Device.  Outcome: Not Progressing

## 2018-07-10 NOTE — Progress Notes (Signed)
RT NOTE:  Pt refuses CPAP tonight. Pt understands RT is available if he changes his mind.

## 2018-07-10 NOTE — Progress Notes (Signed)
Physical Therapy Session Note  Patient Details  Name: William [name] MRN: [id number] Date of Birth: Mar 17, 1951  Today's Date: 07/10/2018 PT Individual Time: 0800-0900 PT Individual Time Calculation (min): 60 min   Short Term Goals: Week 1:  PT Short Term Goal 1 (Week 1): Pt will sit EOB with supervision assist from PT PT Short Term Goal 2 (Week 1): PT will ambulate 45ft with mod assist and LRAD  PT Short Term Goal 3 (Week 1): Pt will propell WC 163ft with supevisoin assist  PT Short Term Goal 4 (Week 1): Pt will initiate stain management training   Skilled Therapeutic Interventions/Progress Updates:  Pt was seen bedside in the am. Pt transferred supine to edge of bed with head of bed elevated, side rail and min A with verbal cues. Pt transferred sit to stand min A and min to mod A for stand pivot transfer to w/c. Pt transported to rehab gym. Pt performed multiple sit to stand pivot transfers with min A and verbal cues. In gym treatment focused on NMR utilizing step taps and alternating step taps 3 sets x 10 reps each. Pt ambulated 25 feet x 2 without assistive device and min to mod A with verbal cues for technique and safety awareness. Pt returned to room following treatment and left sitting up in w/c with chair alarm in place and call bell within reach. Pt does better with simple one step commands. Pt also has decreased attention span requiring frequent redirection to remain focused on task at hand.   Therapy Documentation Precautions:  Precautions Precautions: Back, Fall Required Braces or Orthoses: Spinal Brace Spinal Brace: Applied in sitting position Restrictions Weight Bearing Restrictions: No General:   Pain: Pt c/o 4/10 low back pain.   Therapy/Group: Individual Therapy  Gentle, [place] 07/10/2018, 12:08 PM

## 2018-07-10 NOTE — Progress Notes (Signed)
Occupational Therapy Session Note  Patient Details  Name: William [name] MRN: [id number] Date of Birth: 11/12/1950  Today's Date: 07/10/2018 OT Individual Time: 1420-1534 OT Individual Time Calculation (min): 74 min   Short Term Goals: Week 1:  OT Short Term Goal 1 (Week 1): Pt will maintain sustained attention to selfcare tasks for 30 mins with no more than min instructional cueing for re-direction. OT Short Term Goal 2 (Week 1): Pt will completed donning and doffing of his back brace with setup only. OT Short Term Goal 3 (Week 1): Pt will complete LB dressing with AE and min assist sit to stand.  OT Short Term Goal 4 (Week 1): Pt will perform walk-in shower transfer with supervision to shower seat. OT Short Term Goal 5 (Week 1): Pt will state 3/3 back precautions with supervision for two consecutive sessions.  Skilled Therapeutic Interventions/Progress Updates:    Pt greeted in bed and reported pain was "not bad." Agreeable to get OOB to complete BADLs. Steady assist for transition EOB with HOB elevated. Pt initially able to maintain sitting balance with close supervision with bilateral UE support on mattress, but when he removed one UE to assist with donning back brace, pt with increased posterior LOB. Able to recover unassisted but he needed assist to safely don the back brace. Mod A stand pivot<w/c without device. Mod A, progressing to steady assist sit<stand at sink during bathing and oral care/grooming tasks. Pt required step by step cues for sequencing throughout due to apraxia and external/internal distractibility. He often stood up without warning, and unable to state why he was standing (so w/c remained locked for most of session). Rt lean noted after 30 minutes of sitting, with heavy reliance on w/c armrest for upright support at this time. Pt exhibited 1 bout of increased verbal agitation, but able to be de-escalated and redirected. He needed cues to locate sleeves of hospital gown  as he threaded each UE through the metal clips. Unable to self recognize. Worked on UB strengthening and praxis by having pt self propel w/c 10 ft down hallway. He required max vcs and HOH assist for carryover of technique. Often veered to the Lt and needed assist for maintaining straight path. OT escorted him remainder of way to gym to retrieve LH sponge for bathing. He would also benefit from a reacher when they are in stock. Pt was then taken back to room (he was unable to locate room or state his room number) and completed stand pivot<bed with Mod A. Back brace doffed by OT and he returned to supine with Mod A via logroll technique. Pt was left in bed with all needs within reach, bed alarm set, and fall mats in place.    Therapy Documentation Precautions:  Precautions Precautions: Back, Fall Required Braces or Orthoses: Spinal Brace Spinal Brace: Applied in sitting position Restrictions Weight Bearing Restrictions: No Vital Signs: Therapy Vitals Temp: 98.3 F (36.8 C) Temp Source: Oral Pulse Rate: 84 Resp: 18 BP: (!) 119/58 Patient Position (if appropriate): Lying Oxygen Therapy SpO2: 95 % O2 Device: Room Air Pain: Pt stated he did not have pain, though at times would call out and grimace as if in pain  Pain Assessment Pain Score: Asleep ADL: ADL Grooming: Setup Where Assessed-Grooming: Wheelchair Upper Body Bathing: Supervision/safety Where Assessed-Upper Body Bathing: Wheelchair Lower Body Bathing: Maximal assistance Where Assessed-Lower Body Bathing: Wheelchair Upper Body Dressing: Maximal assistance Where Assessed-Upper Body Dressing: Wheelchair Lower Body Dressing: Maximal assistance Where Assessed-Lower Body Dressing: Wheelchair  Toileting: Maximal assistance Where Assessed-Toileting: Bedside Commode Toilet Transfer: Maximal assistance Toilet Transfer Method: Stand pivot Toilet Transfer Equipment: Bedside commode      Therapy/Group: Individual Therapy  William [name]  A Dalayla Aldredge 07/10/2018, 5:46 PM

## 2018-07-11 NOTE — Plan of Care (Signed)
  Problem: Consults Goal: Skin Care Protocol Initiated - if Braden Score 18 or less Description If consults are not indicated, leave blank or document N/A Outcome: Progressing Goal: RH GENERAL PATIENT EDUCATION Description See Patient Education module for education specifics. Outcome: Progressing   Problem: RH SKIN INTEGRITY Goal: RH STG SKIN FREE OF INFECTION/BREAKDOWN Description With min. Assist.  Outcome: Progressing Goal: RH STG MAINTAIN SKIN INTEGRITY WITH ASSISTANCE Description STG Maintain Skin Integrity With min.Assistance.  Outcome: Progressing Goal: RH STG ABLE TO PERFORM INCISION/WOUND CARE W/ASSISTANCE Description STG Able To Perform Incision/Wound Care With mod. Assistance.  Outcome: Progressing   Problem: RH PAIN MANAGEMENT Goal: RH STG PAIN MANAGED AT OR BELOW PT'S PAIN GOAL Description Less than 3  Outcome: Progressing   Problem: RH KNOWLEDGE DEFICIT GENERAL Goal: RH STG INCREASE KNOWLEDGE OF SELF CARE AFTER HOSPITALIZATION Description Pt. Will be able to verbalized the safety precautions to prevent complication with his back surgery.  Outcome: Progressing   Problem: RH BOWEL ELIMINATION Goal: RH STG MANAGE BOWEL WITH ASSISTANCE Description STG Manage Bowel with Min. Assistance.  Outcome: Not Progressing Goal: RH STG MANAGE BOWEL W/MEDICATION W/ASSISTANCE Description STG Manage Bowel with Medication with Min.Assistance.  Outcome: Not Progressing   Problem: RH BLADDER ELIMINATION Goal: RH STG MANAGE BLADDER WITH ASSISTANCE Description STG Manage Bladder With Mod.Assistance  Outcome: Not Progressing   Problem: RH SAFETY Goal: RH STG ADHERE TO SAFETY PRECAUTIONS W/ASSISTANCE/DEVICE Description STG Adhere to Safety Precautions With mod. Assistance/Device.  Outcome: Not Progressing

## 2018-07-11 NOTE — Progress Notes (Signed)
William [name] is a 68 y.o. male Dec 15, 1950 [id number]  Subjective: Feeling pretty well. Pain in back feels slightly less today. No concerns raised.  Objective: Vital signs in last 24 hours: Temp:  [98 F (36.7 C)-98.3 F (36.8 C)] 98.2 F (36.8 C) (02/23 0624) Pulse Rate:  [80-93] 84 (02/23 0803) Resp:  [18-20] 20 (02/22 1937) BP: (101-120)/(58-87) 118/87 (02/23 0803) SpO2:  [95 %-97 %] 97 % (02/23 0624) Weight change:  Last BM Date: 07/07/18  Intake/Output from previous day: 02/22 0701 - 02/23 0700 In: 120 [P.O.:120] Out: 2250 [Urine:2250]  Physical Exam General: No apparent distress   At sink in [facility] with RN. Pleasant and jovial today Lungs: Normal effort. Lungs clear to auscultation, no crackles or wheezes. Cardiovascular: Regular rate and rhythm, no edema   Lab Results: BMET    Component Value Date/Time   NA 135 07/09/2018 0531   K 3.7 07/09/2018 0531   CL 96 (L) 07/09/2018 0531   CO2 29 07/09/2018 0531   GLUCOSE 140 (H) 07/09/2018 0531   BUN 20 07/09/2018 0531   CREATININE 0.96 07/09/2018 0531   CALCIUM 9.4 07/09/2018 0531   GFRNONAA >60 07/09/2018 0531   GFRAA >60 07/09/2018 0531   CBC    Component Value Date/Time   WBC 9.6 07/09/2018 0531   RBC 4.35 07/09/2018 0531   HGB 13.0 07/09/2018 0531   HCT 37.3 (L) 07/09/2018 0531   PLT 255 07/09/2018 0531   MCV 85.7 07/09/2018 0531   MCH 29.9 07/09/2018 0531   MCHC 34.9 07/09/2018 0531   RDW 11.6 07/09/2018 0531   LYMPHSABS 1.9 07/09/2018 0531   MONOABS 0.8 07/09/2018 0531   EOSABS 0.2 07/09/2018 0531   BASOSABS 0.0 07/09/2018 0531   CBG's (last 3):  No results for input(s): GLUCAP in the last 72 hours. LFT's Lab Results  Component Value Date   ALT 123 (H) 07/09/2018   AST 51 (H) 07/09/2018   ALKPHOS 66 07/09/2018   BILITOT 0.6 07/09/2018    Studies/Results: No results found.  Medications:  I have reviewed the patient's current medications. Scheduled Medications: . allopurinol  100 mg  Oral Daily  . amLODipine  5 mg Oral Daily  . atorvastatin  10 mg Oral q1800  . docusate sodium  100 mg Oral BID  . feeding supplement (PRO-STAT SUGAR FREE 64)  30 mL Oral BID  . losartan  100 mg Oral Daily   And  . hydrochlorothiazide  12.5 mg Oral Daily  . loratadine  10 mg Oral Daily  . magnesium oxide  400 mg Oral BID  . metoprolol succinate  100 mg Oral Daily  . pantoprazole  40 mg Oral QAC breakfast  . tamsulosin  0.4 mg Oral BID   PRN Medications: acetaminophen, alum & mag hydroxide-simeth, bisacodyl, diphenhydrAMINE, HYDROcodone-acetaminophen, lidocaine, menthol-cetylpyridinium **OR** phenol, polyethylene glycol, polyvinyl alcohol, prochlorperazine **OR** prochlorperazine **OR** prochlorperazine, sodium phosphate, traMADol, traZODone  Assessment/Plan: Principal Problem:   Epidural hematoma (HCC) Active Problems:   Lumbar stenosis with neurogenic claudication   Urinary incontinence due to benign prostatic hyperplasia   Spinal stenosis of lumbar region with radiculopathy   Transaminitis   Hypoalbuminemia due to protein-calorie malnutrition ([place])   Hyperglycemia   Acute delirium   1. Debility following epidural hematoma complication of postop laminectomy - continued back pain. Continue CIR therapies for strengthening as planned. 2. Delirium related to #1 - other neuro or systemic issues without evidence for infx or imbalance. Minimize meds with potential cognitive side effects like opioids or  central acting muscle relaxants 3. Urinary retention - hx uro issues including bladder ca and BPH - now on flomax - monitor PVRs 4. HTN - med mgmt as ongoing 5. Hyperglycemia, random - pending a1c order for Mon  Length of stay, days: 3  Syona Wroblewski A. Asa Lente, MD 07/11/2018, 9:21 AM

## 2018-07-12 ENCOUNTER — Inpatient Hospital Stay (HOSPITAL_COMMUNITY): Payer: Federal, State, Local not specified - PPO | Admitting: Occupational Therapy

## 2018-07-12 ENCOUNTER — Inpatient Hospital Stay (HOSPITAL_COMMUNITY): Payer: Federal, State, Local not specified - PPO | Admitting: Physical Therapy

## 2018-07-12 ENCOUNTER — Inpatient Hospital Stay (HOSPITAL_COMMUNITY): Payer: Federal, State, Local not specified - PPO | Admitting: Speech Pathology

## 2018-07-12 DIAGNOSIS — M48061 Spinal stenosis, lumbar region without neurogenic claudication: Secondary | ICD-10-CM

## 2018-07-12 DIAGNOSIS — M5416 Radiculopathy, lumbar region: Secondary | ICD-10-CM

## 2018-07-12 DIAGNOSIS — N390 Urinary tract infection, site not specified: Secondary | ICD-10-CM

## 2018-07-12 DIAGNOSIS — R0989 Other specified symptoms and signs involving the circulatory and respiratory systems: Secondary | ICD-10-CM

## 2018-07-12 LAB — COMPREHENSIVE METABOLIC PANEL
ALT: 80 U/L — ABNORMAL HIGH (ref 0–44)
AST: 31 U/L (ref 15–41)
Albumin: 3.3 g/dL — ABNORMAL LOW (ref 3.5–5.0)
Alkaline Phosphatase: 74 U/L (ref 38–126)
Anion gap: 12 (ref 5–15)
BUN: 23 mg/dL (ref 8–23)
CO2: 23 mmol/L (ref 22–32)
Calcium: 10.1 mg/dL (ref 8.9–10.3)
Chloride: 97 mmol/L — ABNORMAL LOW (ref 98–111)
Creatinine, Ser: 1.12 mg/dL (ref 0.61–1.24)
GFR calc Af Amer: >60 mL/min (ref >60)
GFR calc non Af Amer: >60 mL/min (ref >60)
Glucose, Bld: 177 mg/dL — ABNORMAL HIGH (ref 70–99)
Potassium: 4 mmol/L (ref 3.5–5.1)
Sodium: 132 mmol/L — ABNORMAL LOW (ref 135–145)
TOTAL PROTEIN: 6.9 g/dL (ref 6.5–8.1)
Total Bilirubin: 0.4 mg/dL (ref 0.3–1.2)

## 2018-07-12 LAB — HEMOGLOBIN A1C
HEMOGLOBIN A1C: 7.5 % — AB (ref 4.8–5.6)
Mean Plasma Glucose: 168.55 mg/dL

## 2018-07-12 MED ORDER — CEPHALEXIN 250 MG PO CAPS
250.0000 mg | ORAL_CAPSULE | Freq: Four times a day (QID) | ORAL | Status: AC
  Administered 2018-07-12 – 2018-07-14 (×7): 250 mg via ORAL
  Filled 2018-07-12 (×7): qty 1

## 2018-07-12 NOTE — Progress Notes (Signed)
RT Note:  Patient refused CPAP for HS.  

## 2018-07-12 NOTE — Progress Notes (Signed)
RT note: Pt refuses CPAP QHS

## 2018-07-12 NOTE — Progress Notes (Signed)
[facility] PHYSICAL MEDICINE & REHABILITATION PROGRESS NOTE  Subjective/Complaints:  Patient seen laying in bed this morning.  He states he slept well overnight.  He states he does have a bit weak anticoagulation.  He states he wanted to shave this morning.  ROS: Denies CP, shortness of breath, nausea, vomiting, diarrhea.  Objective: Vital Signs: Blood pressure (!) 100/58, pulse 73, temperature 97.9 F (36.6 C), temperature source Oral, resp. rate 16, height 5\' 11"  (1.803 m), weight 91.9 kg, SpO2 92 %. No results found. No results for input(s): WBC, HGB, HCT, PLT in the last 72 hours. No results for input(s): NA, K, CL, CO2, GLUCOSE, BUN, CREATININE, CALCIUM in the last 72 hours.  Physical Exam: BP (!) 100/58 (BP Location: Right Arm)   Pulse 73   Temp 97.9 F (36.6 C) (Oral)   Resp 16   Ht 5\' 11"  (1.803 m)   Wt 91.9 kg   SpO2 92%   BMI 28.26 kg/m  Constitutional: No distress . Vital signs reviewed. HENT: Normocephalic.  Atraumatic. Eyes: EOMI. No discharge. Cardiovascular: RRR.  No JVD. Respiratory: CTA bilaterally.  Normal effort. GI: BS +. Non-distended. Musc: No edema or tenderness in extremities. Neuro: Alert and oriented x3. Follows commands. Motor: Grossly 4+/5 throughout, unchanged Skin: Back with dressing C/D/I Psych: Perseverative.  Assessment/Plan: 1. Functional deficits secondary to encephalopathy status post epidural hematoma with laminectomy which require 3+ hours per day of interdisciplinary therapy in a comprehensive inpatient rehab setting.  Physiatrist is providing close team supervision and 24 hour management of active medical problems listed below.  Physiatrist and rehab team continue to assess barriers to discharge/monitor patient progress toward functional and medical goals  Care Tool:  Bathing    Body parts bathed by patient: Right arm, Left arm, Abdomen, Face, Left upper leg, Right upper leg, Front perineal area, Buttocks, Chest   Body parts  bathed by helper: Chest, Front perineal area, Buttocks, Left lower leg, Right lower leg Body parts n/a: Right lower leg, Left lower leg   Bathing assist Assist Level: Moderate Assistance - Patient 50 - 74%     Upper Body Dressing/Undressing Upper body dressing   What is the patient wearing?: Hospital gown only, Orthosis    Upper body assist Assist Level: Maximal Assistance - Patient 25 - 49%    Lower Body Dressing/Undressing Lower body dressing      What is the patient wearing?: Incontinence brief     Lower body assist Assist for lower body dressing: Maximal Assistance - Patient 25 - 49%     Toileting Toileting    Toileting assist Assist for toileting: Moderate Assistance - Patient 50 - 74%     Transfers Chair/bed transfer  Transfers assist     Chair/bed transfer assist level: Moderate Assistance - Patient 50 - 74%     Locomotion Ambulation   Ambulation assist      Assist level: Moderate Assistance - Patient 50 - 74% Assistive device: Hand held assist Max distance: 10   Walk 10 feet activity   Assist  Walk 10 feet activity did not occur: Safety/medical concerns  Assist level: Moderate Assistance - Patient - 50 - 74% Assistive device: Hand held assist   Walk 50 feet activity   Assist Walk 50 feet with 2 turns activity did not occur: Safety/medical concerns         Walk 150 feet activity   Assist Walk 150 feet activity did not occur: Safety/medical concerns  Walk 10 feet on uneven surface  activity   Assist Walk 10 feet on uneven surfaces activity did not occur: Safety/medical concerns         Wheelchair     Assist   Type of Wheelchair: Manual    Wheelchair assist level: Moderate Assistance - Patient 50 - 74% Max wheelchair distance: 127ft    Wheelchair 50 feet with 2 turns activity    Assist        Assist Level: Moderate Assistance - Patient 50 - 74%   Wheelchair 150 feet activity     Assist      Assist Level: Moderate Assistance - Patient 50 - 74%      Medical Problem List and Plan: 1. Debility and cognitive deficits secondary to Epidural hematoma, post op lumbar laminectomy with encephalopathy  Continue CIR  Weekend notes reviewed 2. DVT Prophylaxis/Anticoagulation: Mechanical:Sequential compression devices, below kneeBilateral lower extremities 3. Pain Management:Tylenol as needed for now. Local measures with heat and/or ice.  4. Mood:LCSW to follow for evaluation and support. 5. Neuropsych: This patientis not fullycapable of making decisions onhisown behalf. 6. Skin/Wound Care:Monitor wound for healing and for any recurrent drainage. Monitor for signs of infection. 7. Fluids/Electrolytes/Nutrition:Monitor I's and O's. Offer nutritional supplements as with variable p.o. intake  BMP within acceptable range on 2/21, labs pending 8. Delirium: Not fully resolved. Continue to monitor.  Discontinued Norco and flexeril--has not had any in >24 hours.   Acute lower UTI  UA negative, urine culture greater than 100,000 Klebsiella.   Keflex started on 2/24  Monitor for other signs of infection.  9.HTN: Monitor blood pressures twice daily. Continue Cozaar, Toprol-XL and amlodipine  Labile on 2/24, monitor for trend  Monitor with increased mobility 10.History of bladder cancer/BPH:Recent episode of urinary retention. Monitor voiding with cath for volumes greater than 350 cc.  Flomax started 11.Epidural hematoma/ABLA: Monitor for signs of bleeding  Hemoglobin within normal limits on 2/21 12.GERD: Continue Protonix 13.  Hyperglycemia  Elevated on 2/21  Hemoglobin A1c pending 14.  Hypoalbuminemia  Supplement initiated on 2/21 15.  Transaminitis  LFTs elevated on 2/21  Avoid hepatotoxic meds  Labs pending  LOS: 4 days A FACE TO FACE EVALUATION WAS PERFORMED  Laticha Ferrucci Lorie Phenix 07/12/2018, 8:44 AM

## 2018-07-12 NOTE — Progress Notes (Signed)
Speech Language Pathology Daily Session Note  Patient Details  Name: Jarious Lyon MRN: [id number] Date of Birth: 05-Apr-1951  Today's Date: 07/12/2018 SLP Individual Time: 1000-1045 SLP Individual Time Calculation (min): 45 min  Short Term Goals: Week 1: SLP Short Term Goal 1 (Week 1): Pt will complete mildly complex probem solving tasks with mod assist verbal/visual cues. SLP Short Term Goal 2 (Week 1): Pt will sustain attention to tasks for at least 10 minutes with mod assist verbal cues for redirection.  SLP Short Term Goal 3 (Week 1): Pt will demonstrate intellectual awareness by identifying 2 of his current deficits with mod assist. SLP Short Term Goal 4 (Week 1): Pt will recall information with mod assist to utilize compensatory memory strategies/aids. SLP Short Term Goal 5 (Week 1): Pt will follow at least 2-step directions to complete functional tasks with no more than 2 repetitions from clinician.   Skilled Therapeutic Interventions: Skilled treatment session focused on cognition goals. SLP facilitated session by providing Max A cues to recall back precautions, Max A to recall orientation information faded to Mod A when given calendar and Max A to answer intellectual awareness questions. Pt also required Max A cues to sustain attention to SLP and tasks for ~ 10 minutes. Pt restless throughout session. Pt was left supine in bed, bed alarm on, telesitter in place and all needs within reach. Continue per current plan of care.      Pain Pain Assessment Pain Scale: 0-10 Pain Score: 0-No pain  Therapy/Group: Individual Therapy  Astou Lada 07/12/2018, 12:18 PM

## 2018-07-12 NOTE — Progress Notes (Signed)
Occupational Therapy Session Note  Patient Details  Name: William [name] MRN: [id number] Date of Birth: 21-Apr-1951  Today's Date: 07/12/2018 OT Individual Time: 1302-1340 OT Individual Time Calculation (min): 38 min    Short Term Goals: Week 1:  OT Short Term Goal 1 (Week 1): Pt will maintain sustained attention to selfcare tasks for 30 mins with no more than min instructional cueing for re-direction. OT Short Term Goal 2 (Week 1): Pt will completed donning and doffing of his back brace with setup only. OT Short Term Goal 3 (Week 1): Pt will complete LB dressing with AE and min assist sit to stand.  OT Short Term Goal 4 (Week 1): Pt will perform walk-in shower transfer with supervision to shower seat. OT Short Term Goal 5 (Week 1): Pt will state 3/3 back precautions with supervision for two consecutive sessions.  Skilled Therapeutic Interventions/Progress Updates:    Pt in wheelchair at start of session.  He had removed his honey comb dressing from his back incision so nursing notified and they placed new dressing.  Mod assist for pt to then donn TLSO in sitting position.  Pt oriented to place again but not day of the week.  Took pt to the therapy gym for next part of session.  Had him complete stand pivot transfer to the mat with min assist.  Pt with short small choppy steps noted.  Once in sitting pt with increased pain and decreased selective attention.  Attempted to have pt work on simple PVC pipe puzzle in sitting however pt too distracted by back pain as well as the environment.  In sitting, pt with noted lean to the left requiring mod assist to correct.  Had pt transfer back to the wheelchair with mod assist using the RW with mod instructional cueing for technique.  Therapist then pushed him back to the room where he ambulated approximately 10' back to the bed.  Short steps noted again with decreased safety awareness noted when sitting with pt not squaring up to the surface and not  bringing the walker back or reaching to the surface.  Mod instructional cueing for remembering to remove the back brace before laying down with min guard assist.  Pt left in bed with call button and phone in reach and bed alarm in place.  Pt talking to his friend on the phone at end of session, noted falling asleep during conversation.  Discussed with PA Dan pt's current confusion level and decreased memory.    Therapy Documentation Precautions:  Precautions Precautions: Back, Fall Required Braces or Orthoses: Spinal Brace Spinal Brace: Applied in sitting position Restrictions Weight Bearing Restrictions: No   Pain: Pain Assessment Pain Scale: Faces Pain Score: 0-No pain Faces Pain Scale: Hurts little more Pain Type: Acute pain Pain Location: Back Pain Orientation: Lower Pain Descriptors / Indicators: Aching Pain Frequency: Intermittent Pain Onset: With Activity Patients Stated Pain Goal: 0 Pain Intervention(s): Repositioned Multiple Pain Sites: No  Therapy/Group: Individual Therapy  [name],Hien OTR/L 07/12/2018, 2:27 PM

## 2018-07-12 NOTE — Progress Notes (Signed)
Physical Therapy Session Note  Patient Details  Name: William [name] MRN: [id number] Date of Birth: 1950/05/30  Today's Date: 07/12/2018 PT Individual Time: 1105-1200 PT Individual Time Calculation (min): 55 min   Short Term Goals: Week 1:  PT Short Term Goal 1 (Week 1): Pt will sit EOB with supervision assist from PT PT Short Term Goal 2 (Week 1): PT will ambulate 43f with mod assist and LRAD  PT Short Term Goal 3 (Week 1): Pt will propell WC 107fwith supevisoin assist  PT Short Term Goal 4 (Week 1): Pt will initiate stain management training   Skilled Therapeutic Interventions/Progress Updates: Pt presented in bed c/o increased pain but agreeable to therapy. PTA notified nsg who provided pain meds during session. Pt able to recall 2/3 back precautions however required mod cues to maintain back precautions throughout session with functional activities. Performed supine to sit with HOB elevated to 30 degrees via log roll technique with CGA and min verbal cues for sequencing. Performed stand pivot to w/c minA with HHA with verbal cues for foot placement and with poor foot clearance by pt. Pt propelled w/c [id number] min/modA with BUE. Pt required frequent cues for sustained use of LUE to avoid bumping into walls. Pt noted to be easily distracted both externally and internally. Once in rehab gym pt performed stand pivot to mat minA with improved posture noted during STS. Pt performed STS x 5 from mat for BLE. Performed toe taps to target and alternating toe taps x 5-10 as x 2 pt would impulsively sit when performing toe taps. When questioned by PTA pt stating unable to maintain balance on LE that was the LE performing activity (not stance LE). Pt also participated in standing balance reaching for clothespins and placing on basketball net. Pt had several attempts to sit however PTA able to re-direct pt and maintain standing. Trialed gait training with RW, pt was able to perform STS and maintain  static balance with minA close to CGAAppomattoxt however was unable to initiate step with only verbal cues. Pt required mod/max multimodal cues and increased time to initiate step with PTA tapping spot on floor for pt to place foot and PTA saying "step". Pt was able to ambulate approx [id number] minA physically and max verbal cues. Pt ambulated with shorted step length, B knee flexion, and decreased heel strike. Pt transported back to room at end of session and agreeable to remain in w/c with belt alarm on, call bell within reach and current needs met.      Therapy Documentation Precautions:  Precautions Precautions: Back, Fall Required Braces or Orthoses: Spinal Brace Spinal Brace: Applied in sitting position Restrictions Weight Bearing Restrictions: No General:   Vital Signs: Therapy Vitals Pulse Rate: 98 BP: 122/74 Patient Position (if appropriate): Sitting Pain: Pain Assessment Pain Scale: 0-10 Pain Score: 0-No pain Faces Pain Scale: Hurts little more Pain Type: Acute pain Pain Location: Back Pain Orientation: Mid Pain Descriptors / Indicators: Aching Pain Frequency: Intermittent Pain Onset: On-going Patients Stated Pain Goal: 0 Pain Intervention(s): Medication (See eMAR);Repositioned Multiple Pain Sites: No   Therapy/Group: Individual Therapy  Jovaughn Wojtaszek  Khiara Shuping, PTA  07/12/2018, 12:12 PM

## 2018-07-12 NOTE — Progress Notes (Signed)
Occupational Therapy Session Note  Patient Details  Name: William [name] MRN: [id number] Date of Birth: Oct 15, 1950  Today's Date: 07/12/2018 OT Individual Time: [phone] OT Individual Time Calculation (min): 59 min    Short Term Goals: Week 1:  OT Short Term Goal 1 (Week 1): Pt will maintain sustained attention to selfcare tasks for 30 mins with no more than min instructional cueing for re-direction. OT Short Term Goal 2 (Week 1): Pt will completed donning and doffing of his back brace with setup only. OT Short Term Goal 3 (Week 1): Pt will complete LB dressing with AE and min assist sit to stand.  OT Short Term Goal 4 (Week 1): Pt will perform walk-in shower transfer with supervision to shower seat. OT Short Term Goal 5 (Week 1): Pt will state 3/3 back precautions with supervision for two consecutive sessions.  Skilled Therapeutic Interventions/Progress Updates:    Pt worked on bathing and dressing sit to stand at the sink during session.  Pt able to to state 2/3 precautions when questioned as well as month and day of the week.   He was not oriented to the place or the day of the month.  He completed transfer to the wheelchair stand pivot with min assist.  He needed mod instructional cueing for sequencing bathing secondary to still demonstrating confusion.  Max demonstrational cueing for using AE for LB bathing and for dressing tasks.  Max instructional cueing to refrain from bending and breaking back precautions.  Min assist for sit to stand at the sink.  He still exhibits decreased standing balance with posterior lean.  Finished session with pt in the wheelchair at bed side with call button and phone in reach and safety belt in place.    Therapy Documentation Precautions:  Precautions Precautions: Back, Fall Required Braces or Orthoses: Spinal Brace Spinal Brace: Applied in sitting position Restrictions Weight Bearing Restrictions: No  Pain: Pain Assessment Pain Scale:  Faces Faces Pain Scale: Hurts little more Pain Type: Surgical pain Pain Location: Back Pain Orientation: Lower Pain Descriptors / Indicators: Discomfort Pain Onset: With Activity Pain Intervention(s): Repositioned ADL: See Care Tool Section for some details  Therapy/Group: Individual Therapy  Tonya Carlile,Ramir OTR/L 07/12/2018, 9:17 AM

## 2018-07-13 ENCOUNTER — Inpatient Hospital Stay (HOSPITAL_COMMUNITY): Payer: Federal, State, Local not specified - PPO | Admitting: Speech Pathology

## 2018-07-13 ENCOUNTER — Inpatient Hospital Stay (HOSPITAL_COMMUNITY): Payer: Federal, State, Local not specified - PPO | Admitting: Occupational Therapy

## 2018-07-13 ENCOUNTER — Inpatient Hospital Stay (HOSPITAL_COMMUNITY): Payer: Medicare Other

## 2018-07-13 ENCOUNTER — Encounter (HOSPITAL_COMMUNITY): Payer: Self-pay | Admitting: Neurosurgery

## 2018-07-13 ENCOUNTER — Encounter (HOSPITAL_COMMUNITY): Payer: Federal, State, Local not specified - PPO | Admitting: Psychology

## 2018-07-13 ENCOUNTER — Inpatient Hospital Stay (HOSPITAL_COMMUNITY): Payer: Federal, State, Local not specified - PPO

## 2018-07-13 DIAGNOSIS — E871 Hypo-osmolality and hyponatremia: Secondary | ICD-10-CM

## 2018-07-13 DIAGNOSIS — E119 Type 2 diabetes mellitus without complications: Secondary | ICD-10-CM

## 2018-07-13 MED ORDER — METFORMIN HCL 500 MG PO TABS
250.0000 mg | ORAL_TABLET | Freq: Every day | ORAL | Status: DC
  Administered 2018-07-13 – 2018-07-23 (×11): 250 mg via ORAL
  Filled 2018-07-13 (×11): qty 1

## 2018-07-13 NOTE — Progress Notes (Signed)
[facility] PHYSICAL MEDICINE & REHABILITATION PROGRESS NOTE  Subjective/Complaints: Patient seen laying in bed this morning.  He states he slept well overnight.  He denies complaints.  ROS: Denies CP, shortness of breath, nausea, vomiting, diarrhea.  Objective: Vital Signs: Blood pressure (!) 147/73, pulse 89, temperature 98.4 F (36.9 C), temperature source Oral, resp. rate 18, height 5\' 11"  (1.803 m), weight 91.9 kg, SpO2 96 %. No results found. No results for input(s): WBC, HGB, HCT, PLT in the last 72 hours. Recent Labs    07/12/18 0938  NA 132*  K 4.0  CL 97*  CO2 23  GLUCOSE 177*  BUN 23  CREATININE 1.12  CALCIUM 10.1    Physical Exam: BP (!) 147/73 (BP Location: Left Arm)   Pulse 89   Temp 98.4 F (36.9 C) (Oral)   Resp 18   Ht 5\' 11"  (1.803 m)   Wt 91.9 kg   SpO2 96%   BMI 28.26 kg/m  Constitutional: No distress . Vital signs reviewed. HENT: Normocephalic.  Atraumatic. Eyes: EOMI. No discharge. Cardiovascular: RRR.  No JVD. Respiratory: CTA bilaterally.  Normal effort. GI: BS +. Non-distended. Musc: No edema or tenderness in extremities. Neuro: Alert and oriented. Follows commands. Motor: Grossly 4+/5 throughout, stable Skin: Back with dressing C/D/I Psych: Normal mood.  Normal behavior..  Assessment/Plan: 1. Functional deficits secondary to encephalopathy status post epidural hematoma with laminectomy which require 3+ hours per day of interdisciplinary therapy in a comprehensive inpatient rehab setting.  Physiatrist is providing close team supervision and 24 hour management of active medical problems listed below.  Physiatrist and rehab team continue to assess barriers to discharge/monitor patient progress toward functional and medical goals  Care Tool:  Bathing    Body parts bathed by patient: Right arm, Left arm, Chest, Abdomen, Front perineal area, Buttocks, Right upper leg, Left upper leg, Right lower leg, Left lower leg, Face   Body parts  bathed by helper: Chest, Front perineal area, Buttocks, Left lower leg, Right lower leg Body parts n/a: Right lower leg, Left lower leg   Bathing assist Assist Level: Minimal Assistance - Patient > 75%     Upper Body Dressing/Undressing Upper body dressing   What is the patient wearing?: Pull over shirt    Upper body assist Assist Level: Moderate Assistance - Patient 50 - 74%    Lower Body Dressing/Undressing Lower body dressing      What is the patient wearing?: Incontinence brief, Pants     Lower body assist Assist for lower body dressing: Maximal Assistance - Patient 25 - 49%     Toileting Toileting    Toileting assist Assist for toileting: Moderate Assistance - Patient 50 - 74%     Transfers Chair/bed transfer  Transfers assist     Chair/bed transfer assist level: Minimal Assistance - Patient > 75%     Locomotion Ambulation   Ambulation assist      Assist level: Moderate Assistance - Patient 50 - 74% Assistive device: Hand held assist Max distance: 10   Walk 10 feet activity   Assist  Walk 10 feet activity did not occur: Safety/medical concerns  Assist level: Moderate Assistance - Patient - 50 - 74% Assistive device: Hand held assist   Walk 50 feet activity   Assist Walk 50 feet with 2 turns activity did not occur: Safety/medical concerns         Walk 150 feet activity   Assist Walk 150 feet activity did not occur: Safety/medical concerns  Walk 10 feet on uneven surface  activity   Assist Walk 10 feet on uneven surfaces activity did not occur: Safety/medical concerns         Wheelchair     Assist   Type of Wheelchair: Manual    Wheelchair assist level: Moderate Assistance - Patient 50 - 74% Max wheelchair distance: 162ft    Wheelchair 50 feet with 2 turns activity    Assist        Assist Level: Moderate Assistance - Patient 50 - 74%   Wheelchair 150 feet activity     Assist Wheelchair 150  feet activity did not occur: Safety/medical concerns(100 ft max per PT note)          Medical Problem List and Plan: 1. Debility and cognitive deficits secondary to Epidural hematoma, post op lumbar laminectomy with encephalopathy  Continue CIR 2. DVT Prophylaxis/Anticoagulation: Mechanical:Sequential compression devices, below kneeBilateral lower extremities 3. Pain Management:Tylenol as needed for now. Local measures with heat and/or ice.  4. Mood:LCSW to follow for evaluation and support. 5. Neuropsych: This patientis not fullycapable of making decisions onhisown behalf. 6. Skin/Wound Care:Monitor wound for healing and for any recurrent drainage. Monitor for signs of infection. 7. Fluids/Electrolytes/Nutrition:Monitor I's and O's. Offer nutritional supplements as with variable p.o. intake  BMP within acceptable range on 2/21, labs pending 8. Delirium: Not fully resolved. Continue to monitor.  Discontinued Norco and flexeril--has not had any in >24 hours.   Acute lower UTI  UA negative, urine culture greater than 100,000 Klebsiella.   Keflex started on 2/24 x7 days  Monitor for other signs of infection.  9.HTN: Monitor blood pressures twice daily. Continue Cozaar, Toprol-XL and amlodipine  Labile on 2/25, monitor for trend  Monitor with increased mobility 10.History of bladder cancer/BPH:Recent episode of urinary retention. Monitor voiding with cath for volumes greater than 350 cc.  Flomax started 11.Epidural hematoma/ABLA: Monitor for signs of bleeding  Hemoglobin within normal limits on 2/21 12.GERD: Continue Protonix 13.  Diabetes mellitus type 2  Hemoglobin A1c 7.5  Metformin 250 daily started on 2/25 14.  Hypoalbuminemia  Supplement initiated on 2/21 15.  Transaminitis  LFTs elevated, but improving on 2/25  Avoid hepatotoxic meds 16.  Hyponatremia  Sodium 132 on 2/24  Continue to monitor  LOS: 5 days A FACE TO FACE EVALUATION WAS  PERFORMED  Ankit Lorie Phenix 07/13/2018, 8:42 AM

## 2018-07-13 NOTE — Progress Notes (Signed)
Physical Therapy Session Note  Patient Details  Name: William [name] MRN: [id number] Date of Birth: 1951-01-22  Today's Date: 07/13/2018 PT Individual Time: 1535-1630 PT Individual Time Calculation (min): 55 min   Short Term Goals: Week 1:  PT Short Term Goal 1 (Week 1): Pt will sit EOB with supervision assist from PT PT Short Term Goal 2 (Week 1): PT will ambulate 43ft with mod assist and LRAD  PT Short Term Goal 3 (Week 1): Pt will propell WC 145ft with supevisoin assist  PT Short Term Goal 4 (Week 1): Pt will initiate stain management training   Skilled Therapeutic Interventions/Progress Updates:    Pt seated in w/c upon PT arrival, agreeable to therapy tx and reports back pain 4/10. Pt reports having to use bathroom, pt performed stand pivot w/c<>toilet this session with min assist, min assist for standing balance and clothing management, pt continent of bladder. Pt transported to the gym. Pt performed stand pivot to mat mod assist. Pt performed 2 x 5 sit<>stands from mat with min assist, mirror for visual feedback and cues for back precautions. Pt with posterior lean in sitting, worked on anterior weightshift and sitting balance to reach for and toss horseshoes, x 2 trials in sitting. Pt worked on standing balance while tossing horseshoes without UE support, min assist for balance. Pt ambulated x 20 ft and x 25 ft this session with RW and min assist verbal cues for increased step length and upright posture. Pt worked on standing balance without AD and cognition in order to perform card matching activity, x 2 trials with CGA-min assist for balance. Pt transported back to room and transferred to bed stand pivot mod assist. Pt transferred to supine with mod assist to maintain back precautions. Pt left supine with needs in reach and bed alarm set.   Therapy Documentation Precautions:  Precautions Precautions: Back, Fall Required Braces or Orthoses: Spinal Brace Spinal Brace: Applied in  sitting position Restrictions Weight Bearing Restrictions: No    Therapy/Group: Individual Therapy  Netta Corrigan, PT, DPT 07/13/2018, 1:02 PM

## 2018-07-13 NOTE — Progress Notes (Signed)
Was the fall witnessed:   No Patient has a telesitter Patient condition before and after the fall:  Confused, impulsive, inappropriate behaviors Patient's reaction to the fall:  stable  Name of the doctor that was notified including date and time:  Evalee Jefferson, PA  Any interventions and vital signs:  Vitals q15 minutes, full body assessment, neuro assessment  Patient alert and oriented to self. Patient has a telesitter present in room, patient was found in floor on his backside beside the bed by nurse staff, patient stated he was getting out of his chair.he did hit his head on the floor. Patient stated he was having back pain, no apparent  injuries noted upon  assessment, able to move all extremities without any difficulty.  Denies any headache, or tender areas when head assessed.Patient put back to bed by nurse staff with hoyer lift. Vitals recorded per protocol. Neuro assessment in progress

## 2018-07-13 NOTE — Progress Notes (Signed)
Speech Language Pathology Daily Session Note  Patient Details  Name: William [name] MRN: [id number] Date of Birth: February 22, 1951  Today's Date: 07/13/2018 SLP Individual Time: 3643-8377 SLP Individual Time Calculation (min): 25 min  Short Term Goals: Week 1: SLP Short Term Goal 1 (Week 1): Pt will complete mildly complex probem solving tasks with mod assist verbal/visual cues. SLP Short Term Goal 2 (Week 1): Pt will sustain attention to tasks for at least 10 minutes with mod assist verbal cues for redirection.  SLP Short Term Goal 3 (Week 1): Pt will demonstrate intellectual awareness by identifying 2 of his current deficits with mod assist. SLP Short Term Goal 4 (Week 1): Pt will recall information with mod assist to utilize compensatory memory strategies/aids. SLP Short Term Goal 5 (Week 1): Pt will follow at least 2-step directions to complete functional tasks with no more than 2 repetitions from clinician.   Skilled Therapeutic Interventions: Skilled treatment session focused on cognitive goals. Upon arrival, patient was sitting upright in the wheelchair consuming his breakfast. SLP facilitated session by providing overall Mod A verbal cues for problem solving with self-feeding (dipping french toast into protein supplement instead of syrup).  SLP also facilitated session by providing Max A multimodal cues for utilization of a calendar for orientation to time. Total A also required for orientation to situation. Patient left upright in wheelchair with alarm on and all needs within reach. Continue with current plan of care.      Pain No/Denies Pain   Therapy/Group: Individual Therapy  William [name] 07/13/2018, 4:27 PM

## 2018-07-13 NOTE — Consult Note (Signed)
Neuropsychological Consultation   Patient:   Demorio Seeley   DOB:   [birth year]  MR Number:  [id number]  Location:  [facility] [address] [address] [place] [id number] [place] [address] Dept: [place]: [phone]           Date of Service:   07/13/2018  Start Time:   1 pm End Time:   2 PM  Provider/Observer:  Ilean Skill, Psy.D.       Clinical Neuropsychologist       Billing Code/Service: [phone]  Chief Complaint:    Keyondre Hepburn is a 68 year old male with history of HTN, bladder cancer, BPH, lumbar stenosis with radiculopathy s/p decompression 02/2018>  Patient presented on 07/05/2018 with multiple falls, fever, blood drainage from the wound and urinary retention.  Patient treated with antibiotics and sent to [facility] for treatment.  Work up suggested patient likely with hematoma leading to symptoms.  Or for I&D with evacuation of epidural hematoma.  Patient has had significant altered mental status and confusion post surgery.  Persisting over next week.  Patient is now showing significant improvement in cognition over past 24 hours with continued memory deficits for recent past events with confabulation for some subjects.    Reason for Service:  Patient referred for neuropsychological consultation due to coping and adjustment issues and assess cognitive functioning.    WNU:UVOZD D Fulfer is a 68 year old male with history of HTN, bladder cancer, BPH, lumbar stenosis with radiculopathy s/p decompression 02/2018 and recent surgery few days PTA to [facility] on 07/05/18 with multiple falls, fever, blood drainage from the wound and urinary retention. He was treated with IV antibiotics in ED and transferred to [facility] for treatment. NS felt thatXraysnot neededfor work upas patient likely with hematoma leading to symptoms.He was taken to OR for I &D with evacuation of epidural hematoma by Dr. Arnoldo Morale. Wound  cultures with rare WBC and no growth so far. He had issues with lethargy and confusion post op felt to be due to oxycodone therefore this was discontinued with improvement in mentation. Therapy evaluations done revealing functional decline and CIR recommended for follow up therapy.   Current Status:  Patient oriented to person, place and time but disoriented to situation.  Patient was not able to recall why he was in hospital and what had happened leading up to hospitalization.  Patient's daughter reports significant improvements over past 24 hours.  Patient cracking jokes that daughter reports related to earlier in day jokes he and daughter had been having.    Behavioral Observation: Lindy Pennisi  presents as a 67 y.o.-year-old Right Caucasian Male who appeared his stated age. his dress was Appropriate and he was Well Groomed and his manners were Appropriate to the situation.  his participation was indicative of Appropriate, Inattentive and Redirectable behaviors.  There were any physical disabilities noted.  he displayed an appropriate level of cooperation and motivation.     Interactions:    Active Appropriate, Inattentive and Redirectable  Attention:   abnormal and attention span appeared shorter than expected for age  Memory:   abnormal; remote memory intact, recent memory impaired  Visuo-spatial:  not examined  Speech (Volume):  normal  Speech:   normal; normal  Thought Process:  Coherent and Tangential  Though Content:  WNL; not suicidal and not homicidal  Orientation:   person, place and time/date  Judgment:   Fair  Planning:   Poor  Affect:    Appropriate  Mood:    Euthymic  Insight:   Shallow  Intelligence:   high  Medical History:   Past Medical History:  Diagnosis Date  . Arthritis    OA- knees   . Bell's palsy 2014  . Bladder cancer ([place])   . BPH (benign prostatic hyperplasia)   . GERD (gastroesophageal reflux disease)   . Gout   . Hypertension   .  Sleep apnea    CPAP-in use q night, last study 5 yrs. ago  . Spinal stenosis of lumbar region with radiculopathy    Psychiatric History:  No prior psychiatric history.  Family Med/Psych History:  Family History  Problem Relation Age of Onset  . Cancer Mother   . Congestive Heart Failure Father     Risk of Suicide/Violence: virtually non-existent Patient denies SI or HI.  Impression/DX:  Haytham Maher is a 68 year old male with history of HTN, bladder cancer, BPH, lumbar stenosis with radiculopathy s/p decompression 02/2018>  Patient presented on 07/05/2018 with multiple falls, fever, blood drainage from the wound and urinary retention.  Patient treated with antibiotics and sent to [facility] for treatment.  Work up suggested patient likely with hematoma leading to symptoms.  Or for I&D with evacuation of epidural hematoma.  Patient has had significant altered mental status and confusion post surgery.  Persisting over next week.  Patient is now showing significant improvement in cognition over past 24 hours with continued memory deficits for recent past events with confabulation for some subjects.    Patient oriented to person, place and time but disoriented to situation.  Patient was not able to recall why he was in hospital and what had happened leading up to hospitalization.  Patient's daughter reports significant improvements over past 24 hours.  Patient cracking jokes that daughter reports related to earlier in day jokes he and daughter had been having.           Electronically Signed   _______________________ Ilean Skill, Psy.D.

## 2018-07-13 NOTE — Progress Notes (Signed)
Occupational Therapy Session Note  Patient Details  Name: William [name] MRN: [id number] Date of Birth: 1951/05/13  Today's Date: 07/13/2018 OT Individual Time: 4917-9150 OT Individual Time Calculation (min): 51 min    Short Term Goals: Week 1:  OT Short Term Goal 1 (Week 1): Pt will maintain sustained attention to selfcare tasks for 30 mins with no more than min instructional cueing for re-direction. OT Short Term Goal 2 (Week 1): Pt will completed donning and doffing of his back brace with setup only. OT Short Term Goal 3 (Week 1): Pt will complete LB dressing with AE and min assist sit to stand.  OT Short Term Goal 4 (Week 1): Pt will perform walk-in shower transfer with supervision to shower seat. OT Short Term Goal 5 (Week 1): Pt will state 3/3 back precautions with supervision for two consecutive sessions.  Skilled Therapeutic Interventions/Progress Updates:    Pt completed supine to sit EOB with min assist and mod instructional cueing for safety to begin session.  Pt's daughter Cecille Rubin in for session as well and able to provide information that prior to last back surgery in February pt was driving and was overall independent without cognitive deficits.  He needed mod assist for sequencing donning back brace EOB with max assist for dynamic sitting balance secondary to LOB posteriorly.  Min assist for transfer from EOB to the wheelchair at the sink for shaving task.  Pt was able to sit at the sink and complete shaving with increased time and overall min assist for thoroughness.  Mod instructional cueing for safety with cueing to use shaving cream and maintain his attention to task, outside of conversation with his daughter during task.  Pt took approximately thirty mins to complete task with min assist at the end for completion.  Finished session with pt up in the wheelchair with call button and phone in reach and daughter present.  Discussed expectations of LOS of approximately 2 weeks  overall with pt's daughter as well as anticipation of supervision level goals and recommendation of 24 hr supervision secondary to cognition and balance.    Therapy Documentation Precautions:  Precautions Precautions: Back, Fall Required Braces or Orthoses: Spinal Brace Spinal Brace: Applied in sitting position Restrictions Weight Bearing Restrictions: No  Pain: Pain Assessment Pain Scale: Faces Faces Pain Scale: Hurts a little bit Pain Type: Surgical pain Pain Location: Back Pain Orientation: Lower Pain Descriptors / Indicators: Discomfort Pain Onset: With Activity Pain Intervention(s): Repositioned ADL: See Care Tool section for some details of ADL  Therapy/Group: Individual Therapy  Daiya Tamer,Susan OTR/L 07/13/2018, 4:57 PM

## 2018-07-13 NOTE — Progress Notes (Signed)
Patient transported via bed with RN to CT  no  c/o

## 2018-07-13 NOTE — Addendum Note (Signed)
Addendum  created 07/13/18 1843 by Roberts Gaudy, MD   Intraprocedure Event edited, Intraprocedure Staff edited

## 2018-07-13 NOTE — Progress Notes (Signed)
Occupational Therapy Session Note  Patient Details  Name: William [name] MRN: [id number] Date of Birth: 05-22-1950  Today's Date: 07/13/2018 OT Individual Time: 3818-2993 OT Individual Time Calculation (min): 58 min    Short Term Goals: Week 1:  OT Short Term Goal 1 (Week 1): Pt will maintain sustained attention to selfcare tasks for 30 mins with no more than min instructional cueing for re-direction. OT Short Term Goal 2 (Week 1): Pt will completed donning and doffing of his back brace with setup only. OT Short Term Goal 3 (Week 1): Pt will complete LB dressing with AE and min assist sit to stand.  OT Short Term Goal 4 (Week 1): Pt will perform walk-in shower transfer with supervision to shower seat. OT Short Term Goal 5 (Week 1): Pt will state 3/3 back precautions with supervision for two consecutive sessions.  Skilled Therapeutic Interventions/Progress Updates:    Pt completed shower and dressing during session.  Min assist for ambulation from wheelchair at bedside to the shower bench.  Short step length noted with flexed posture in standing and decreased ability to manage walker in tight spaces.  Max assist to square himself up to the surface he is sitting down on.  Mod instructional cueing for removing clothing and back brace following back precautions.  He was able to complete all bathing with min assist sit to stand and mod instructional cueing to avoid bending down toward his feet.  He completed dressing sit to stand from the 3:1 outside of the shower with mod instructional cueing to adhere to back precautions and overall min assist for integration of AE (reacher and sockaide.  Completed session with transfer back to the bed with min assist secondary to back pain.  Call button and phone in reach and safety alarm in place.   Therapy Documentation Precautions:  Precautions Precautions: Back, Fall Required Braces or Orthoses: Spinal Brace Spinal Brace: Applied in sitting  position Restrictions Weight Bearing Restrictions: No  Pain: Pain Assessment Pain Scale: Faces Pain Score: 6  Faces Pain Scale: Hurts a little bit Pain Type: Surgical pain Pain Location: Back Pain Orientation: Lower Pain Descriptors / Indicators: Aching Pain Frequency: Intermittent Pain Onset: On-going Pain Intervention(s): Medication (See eMAR) ADL: See Care Tool Section for some details of ADL  Therapy/Group: Individual Therapy  Natalija Mavis,Marqueze OTR/L 07/13/2018, 12:23 PM

## 2018-07-14 ENCOUNTER — Inpatient Hospital Stay (HOSPITAL_COMMUNITY): Payer: Federal, State, Local not specified - PPO | Admitting: Speech Pathology

## 2018-07-14 ENCOUNTER — Inpatient Hospital Stay (HOSPITAL_COMMUNITY): Payer: Federal, State, Local not specified - PPO | Admitting: Physical Therapy

## 2018-07-14 ENCOUNTER — Inpatient Hospital Stay (HOSPITAL_COMMUNITY): Payer: Federal, State, Local not specified - PPO | Admitting: Occupational Therapy

## 2018-07-14 DIAGNOSIS — W19XXXA Unspecified fall, initial encounter: Secondary | ICD-10-CM

## 2018-07-14 LAB — GLUCOSE, CAPILLARY
Glucose-Capillary: 121 mg/dL — ABNORMAL HIGH (ref 70–99)
Glucose-Capillary: 202 mg/dL — ABNORMAL HIGH (ref 70–99)
Glucose-Capillary: 132 mg/dL — ABNORMAL HIGH (ref 70–99)

## 2018-07-14 MED ORDER — CEPHALEXIN 250 MG PO CAPS
250.0000 mg | ORAL_CAPSULE | Freq: Four times a day (QID) | ORAL | Status: AC
  Administered 2018-07-14 – 2018-07-19 (×22): 250 mg via ORAL
  Filled 2018-07-14 (×22): qty 1

## 2018-07-14 MED ORDER — LIVING WELL WITH DIABETES BOOK
Freq: Once | Status: AC
  Administered 2018-07-14: 18:00:00
  Filled 2018-07-14: qty 1

## 2018-07-14 MED ORDER — CEPHALEXIN 250 MG PO CAPS: 250.0000 mg | ORAL_CAPSULE | Freq: Three times a day (TID) | ORAL | Status: DC

## 2018-07-14 NOTE — Progress Notes (Addendum)
[facility] PHYSICAL MEDICINE & REHABILITATION PROGRESS NOTE  Subjective/Complaints: Patient seen laying in bed this morning.  States he slept well overnight.  Appetite is overall improved.  Per report, patient with fall overnight.  ROS: Denies CP, shortness of breath, nausea, vomiting, diarrhea.  Objective: Vital Signs: Blood pressure 138/64, pulse 86, temperature (!) 97.5 F (36.4 C), resp. rate 18, height 5\' 11"  (1.803 m), weight 91.9 kg, SpO2 94 %. Ct Head Wo Contrast  Result Date: 07/13/2018 CLINICAL DATA:  Headache after a fall. EXAM: CT HEAD WITHOUT CONTRAST TECHNIQUE: Contiguous axial images were obtained from the base of the skull through the vertex without intravenous contrast. COMPARISON:  06/11/2012 FINDINGS: Brain: Diffuse cerebral atrophy. Ventricular dilatation is likely due to central atrophy, although the degree of dilatation is out of proportion to the degree of sulcal atrophy and this could also represent normal pressure hydrocephalus in the appropriate clinical setting. No mass effect or midline shift. No abnormal extra-axial fluid collections. Gray-white matter junctions are distinct. Basal cisterns are not effaced. No acute intracranial hemorrhage. Vascular: Moderate intracranial arterial calcifications. Skull: Calvarium appears intact. No acute depressed skull fractures. Sinuses/Orbits: Paranasal sinuses and mastoid air cells are clear. Other: None. IMPRESSION: 1. No acute intracranial abnormalities. Chronic atrophy and small vessel ischemic changes. 2. Ventricular dilatation is somewhat out of proportion to the degree of sulcal atrophy. Possible normal pressure hydrocephalus versus prominent central atrophy. Electronically Signed   By: Lucienne Capers M.D.   On: 07/13/2018 21:48   No results for input(s): WBC, HGB, HCT, PLT in the last 72 hours. Recent Labs    07/12/18 0938  NA 132*  K 4.0  CL 97*  CO2 23  GLUCOSE 177*  BUN 23  CREATININE 1.12  CALCIUM 10.1     Physical Exam: BP 138/64 (BP Location: Left Arm)   Pulse 86   Temp (!) 97.5 F (36.4 C)   Resp 18   Ht 5\' 11"  (1.803 m)   Wt 91.9 kg   SpO2 94%   BMI 28.26 kg/m  Constitutional: No distress . Vital signs reviewed. HENT: Normocephalic.  Atraumatic. Eyes: EOMI. No discharge. Cardiovascular: RRR.  No JVD. Respiratory: CTA bilaterally.  Normal effort. GI: BS +. Non-distended. Musc: No edema or tenderness in extremities. Neuro: Alert and oriented. Follows commands. Motor: Grossly 4+/5 throughout, unchanged Skin: Back with Steri-Strips C/D/I Psych: Normal mood.  Normal behavior.  Assessment/Plan: 1. Functional deficits secondary to encephalopathy status post epidural hematoma with laminectomy which require 3+ hours per day of interdisciplinary therapy in a comprehensive inpatient rehab setting.  Physiatrist is providing close team supervision and 24 hour management of active medical problems listed below.  Physiatrist and rehab team continue to assess barriers to discharge/monitor patient progress toward functional and medical goals  Care Tool:  Bathing    Body parts bathed by patient: Right arm, Left arm, Chest, Abdomen, Front perineal area, Buttocks, Right upper leg, Left upper leg, Right lower leg, Left lower leg, Face   Body parts bathed by helper: Chest, Front perineal area, Buttocks, Left lower leg, Right lower leg Body parts n/a: Right lower leg, Left lower leg   Bathing assist Assist Level: Minimal Assistance - Patient > 75%     Upper Body Dressing/Undressing Upper body dressing   What is the patient wearing?: Pull over shirt, Orthosis    Upper body assist Assist Level: Moderate Assistance - Patient 50 - 74%    Lower Body Dressing/Undressing Lower body dressing      What  is the patient wearing?: Incontinence brief, Pants     Lower body assist Assist for lower body dressing: Moderate Assistance - Patient 50 - 74%     Toileting Toileting     Toileting assist Assist for toileting: Moderate Assistance - Patient 50 - 74%     Transfers Chair/bed transfer  Transfers assist     Chair/bed transfer assist level: Minimal Assistance - Patient > 75%     Locomotion Ambulation   Ambulation assist      Assist level: Minimal Assistance - Patient > 75% Assistive device: Walker-rolling Max distance: 25 ft   Walk 10 feet activity   Assist  Walk 10 feet activity did not occur: Safety/medical concerns  Assist level: Minimal Assistance - Patient > 75% Assistive device: Walker-rolling   Walk 50 feet activity   Assist Walk 50 feet with 2 turns activity did not occur: Safety/medical concerns         Walk 150 feet activity   Assist Walk 150 feet activity did not occur: Safety/medical concerns         Walk 10 feet on uneven surface  activity   Assist Walk 10 feet on uneven surfaces activity did not occur: Safety/medical concerns         Wheelchair     Assist   Type of Wheelchair: Manual    Wheelchair assist level: Moderate Assistance - Patient 50 - 74% Max wheelchair distance: 123ft    Wheelchair 50 feet with 2 turns activity    Assist        Assist Level: Moderate Assistance - Patient 50 - 74%   Wheelchair 150 feet activity     Assist Wheelchair 150 feet activity did not occur: Safety/medical concerns(100 ft max per PT note)          Medical Problem List and Plan: 1. 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 DVT Prophylaxis/Anticoagulation: Mechanical:Sequential compression devices, below kneeBilateral lower extremities 3. Pain Management:Tylenol as needed for now. Local measures with heat and/or ice.  4. Mood:LCSW to follow for evaluation and support. 5. Neuropsych:  This patientis not fullycapable of making decisions onhisown behalf. 6. Skin/Wound Care:Monitor wound for healing and for any recurrent drainage. Monitor for signs of infection. 7. Fluids/Electrolytes/Nutrition:Monitor I's and O's. Offer nutritional supplements as with variable p.o. intake  BMP within acceptable range on 2/21, labs pending 8. Delirium: Not fully resolved. Continue to monitor.  Discontinued Norco and flexeril--has not had any in >24 hours.   Acute lower UTI  UA negative, urine culture greater than 100,000 Klebsiella.   Keflex started on 2/24 x7 days  Monitor for other signs of infection.  9.HTN: Monitor blood pressures twice daily. Continue Cozaar, Toprol-XL and amlodipine  Relatively controlled on 2/26  Monitor with increased mobility 10.History of bladder cancer/BPH:Recent episode of urinary retention. Monitor voiding with cath for volumes greater than 350 cc.  Flomax started 11.Epidural hematoma/ABLA: Monitor for signs of bleeding  Hemoglobin within normal limits on 2/21  Labs ordered for tomorrow 12.GERD: Continue Protonix 13.  Diabetes mellitus type 2  Hemoglobin A1c 7.5  Metformin 250 daily started on 2/25  CBGs ordered 14.  Hypoalbuminemia  Supplement initiated on 2/21 15.  Transaminitis  LFTs elevated, but improving on 2/25  Avoid hepatotoxic meds 16.  Hyponatremia  Sodium 132 on 2/24  Labs ordered  for tomorrow  Continue to monitor 17. ?Sundowning  Sleep chart ordered  LOS: 6 days A FACE TO FACE EVALUATION WAS PERFORMED  Lynnzie Blackson Lorie Phenix 07/14/2018, 8:43 AM

## 2018-07-14 NOTE — Progress Notes (Signed)
Speech Language Pathology Daily Session Note  Patient Details  Name: William [name] MRN: [id number] Date of Birth: February 05, 1951  Today's Date: 07/14/2018 SLP Individual Time: 1105-1200 SLP Individual Time Calculation (min): 55 min  Short Term Goals: Week 1: SLP Short Term Goal 1 (Week 1): Pt will complete mildly complex probem solving tasks with mod assist verbal/visual cues. SLP Short Term Goal 2 (Week 1): Pt will sustain attention to tasks for at least 10 minutes with mod assist verbal cues for redirection.  SLP Short Term Goal 3 (Week 1): Pt will demonstrate intellectual awareness by identifying 2 of his current deficits with mod assist. SLP Short Term Goal 4 (Week 1): Pt will recall information with mod assist to utilize compensatory memory strategies/aids. SLP Short Term Goal 5 (Week 1): Pt will follow at least 2-step directions to complete functional tasks with no more than 2 repetitions from clinician.   Skilled Therapeutic Interventions:  Pt was seen for skilled ST targeting cognitive goals.  Pt was resting in bed upon therapist's arrival and was agreeable to getting out of the room for therapy with minimal encouragement.  SLP facilitated the session with basic money management tasks to address goals for attention to task and basic functional problem solving.  Pt needed max assist verbal and visual cues to count money and make change due to fluctuating attention to task.  Pt demonstrated improved insight into task difficulty but continues to need heavy cues (at least max assist) to correct errors.  Pt was returned to room and left in bed with all needs within reach, bed alarm set.  Continue per current plan of care.    Pain Pain Assessment Pain Scale: 0-10 Pain Score: 0-No pain  Therapy/Group: Individual Therapy  [name], Selinda Orion 07/14/2018, 2:16 PM

## 2018-07-14 NOTE — Progress Notes (Signed)
Occupational Therapy Session Note  Patient Details  Name: William [name] MRN: [id number] Date of Birth: 1950-08-09  Today's Date: 07/14/2018 OT Individual Time: 0102-7253 OT Individual Time Calculation (min): 54 min    Short Term Goals: Week 1:  OT Short Term Goal 1 (Week 1): Pt will maintain sustained attention to selfcare tasks for 30 mins with no more than min instructional cueing for re-direction. OT Short Term Goal 2 (Week 1): Pt will completed donning and doffing of his back brace with setup only. OT Short Term Goal 3 (Week 1): Pt will complete LB dressing with AE and min assist sit to stand.  OT Short Term Goal 4 (Week 1): Pt will perform walk-in shower transfer with supervision to shower seat. OT Short Term Goal 5 (Week 1): Pt will state 3/3 back precautions with supervision for two consecutive sessions.  Skilled Therapeutic Interventions/Progress Updates:    Session 1:  Pt worked on bathing and dressing during session.  He was in bed to start session, disoriented to place and day of the week.  Min assist for supine to sit EOB and then for transfer to the toilet with use of the RW.  Min assist for clothing management and hygiene sit to stand as well.  He next transferred over to the tub bench for shower.  Mod instructional cueing for sequencing shower secondary to perseverating on washing the left arm and his face.  He also needed max instructional cueing for following back precautions for bathing and dressing as he would frequently attempt to reach down to wash his feet and for donning his brief and shorts.  He needed mod demonstrational cueing for use of the reacher for removing gripper socks prior to shower and for donning brief and pants over feet.  Pt continually leaning forward in sitting with need for UE support to keep from flexing forward further.  Finished session with transfer back to the bed as pt reported increased pain and his awareness was continuing to decline secondary  to this.  Pt left in bed with call button and phone in reach with bed alarm and telesitter in place.    Session 2:  Pt in bed asleep at start of session, slightly difficult to arouse.  When he finally did wake up he was oriented to place but was not oriented to month when asked several times.  He transferred to the EOB with min assist where session focused on donning and doffing TLSO.  He needed mod demonstrational cueing to donn TLSO first attempt but on second attempt he was able to complete this with supervision and mod instructional cueing.  He then stood and completed functional mobility to the door of his room with use of the RW and mod assist.  LOB posteriorly with mobility as well as pt staying too far away from the RW when ambulating.  Longer step length noted however with pt not turning completely around to the surface and sitting when returning to the bed and instead sitting down while still half way turned sideways.  Finished session with pt removing TLSO with min instructional cueing and supervision.  Pt transitioned to supine with min assist to move LEs to the middle of the bed.  Call button and phone in reach with bed alarm in place.    Therapy Documentation Precautions:  Precautions Precautions: Back, Fall Required Braces or Orthoses: Spinal Brace Spinal Brace: Applied in sitting position Restrictions Weight Bearing Restrictions: No  Pain: Pain Assessment Pain Scale: 0-10 Pain  Score: 0-No pain ADL: See Care Tool Section for some details of ADL  Therapy/Group: Individual Therapy  William [name],William [name] 07/14/2018, 3:54 PM

## 2018-07-14 NOTE — Progress Notes (Signed)
Patient up and down all during the night. Uncooperative with care, non compliant with SCD's, and C-PAP.  Verbally abusive when redirected to stay in bed. Education provided on safety and preventing falls.

## 2018-07-14 NOTE — Progress Notes (Signed)
Pt declined use of CPAP for the night.  Machine at bedside for patient use.

## 2018-07-15 ENCOUNTER — Inpatient Hospital Stay (HOSPITAL_COMMUNITY): Payer: Federal, State, Local not specified - PPO | Admitting: Speech Pathology

## 2018-07-15 ENCOUNTER — Inpatient Hospital Stay (HOSPITAL_COMMUNITY): Payer: Federal, State, Local not specified - PPO

## 2018-07-15 ENCOUNTER — Inpatient Hospital Stay (HOSPITAL_COMMUNITY): Payer: Federal, State, Local not specified - PPO | Admitting: Physical Therapy

## 2018-07-15 DIAGNOSIS — F05 Delirium due to known physiological condition: Secondary | ICD-10-CM

## 2018-07-15 LAB — GLUCOSE, CAPILLARY
Glucose-Capillary: 134 mg/dL — ABNORMAL HIGH (ref 70–99)
Glucose-Capillary: 129 mg/dL — ABNORMAL HIGH (ref 70–99)
Glucose-Capillary: 199 mg/dL — ABNORMAL HIGH (ref 70–99)
Glucose-Capillary: 124 mg/dL — ABNORMAL HIGH (ref 70–99)

## 2018-07-15 LAB — CBC WITH DIFFERENTIAL/PLATELET
ABS IMMATURE GRANULOCYTES: 0.04 10*3/uL (ref 0.00–0.07)
BASOS PCT: 0 %
Basophils Absolute: 0 10*3/uL (ref 0.0–0.1)
Eosinophils Absolute: 0.1 10*3/uL (ref 0.0–0.5)
Eosinophils Relative: 2 %
HCT: 36.1 % — ABNORMAL LOW (ref 39.0–52.0)
Hemoglobin: 12.3 g/dL — ABNORMAL LOW (ref 13.0–17.0)
Immature Granulocytes: 1 %
Lymphocytes Relative: 23 %
Lymphs Abs: 1.2 10*3/uL (ref 0.7–4.0)
MCH: 29 pg (ref 26.0–34.0)
MCHC: 34.1 g/dL (ref 30.0–36.0)
MCV: 85.1 fL (ref 80.0–100.0)
Monocytes Absolute: 0.5 10*3/uL (ref 0.1–1.0)
Monocytes Relative: 10 %
Neutro Abs: 3.4 10*3/uL (ref 1.7–7.7)
Neutrophils Relative %: 64 %
PLATELETS: 265 10*3/uL (ref 150–400)
RBC: 4.24 MIL/uL (ref 4.22–5.81)
RDW: 11.2 % — ABNORMAL LOW (ref 11.5–15.5)
WBC: 5.3 10*3/uL (ref 4.0–10.5)
nRBC: 0 % (ref 0.0–0.2)

## 2018-07-15 LAB — BASIC METABOLIC PANEL
Anion gap: 9 (ref 5–15)
BUN: 19 mg/dL (ref 8–23)
CO2: 23 mmol/L (ref 22–32)
Calcium: 9.3 mg/dL (ref 8.9–10.3)
Chloride: 101 mmol/L (ref 98–111)
Creatinine, Ser: 0.96 mg/dL (ref 0.61–1.24)
GFR calc Af Amer: >60 mL/min (ref >60)
GFR calc non Af Amer: >60 mL/min (ref >60)
Glucose, Bld: 142 mg/dL — ABNORMAL HIGH (ref 70–99)
Potassium: 4.2 mmol/L (ref 3.5–5.1)
Sodium: 133 mmol/L — ABNORMAL LOW (ref 135–145)

## 2018-07-15 MED ORDER — TEMAZEPAM 7.5 MG PO CAPS
7.5000 mg | ORAL_CAPSULE | Freq: Every day | ORAL | Status: DC
  Administered 2018-07-15 – 2018-07-22 (×8): 7.5 mg via ORAL
  Filled 2018-07-15 (×8): qty 1

## 2018-07-15 MED ORDER — LIVING WELL WITH DIABETES BOOK
Freq: Once | Status: AC
  Filled 2018-07-15: qty 1

## 2018-07-15 NOTE — Progress Notes (Signed)
CPAP machine at bedside- pt declined use

## 2018-07-15 NOTE — Progress Notes (Signed)
Social Work Patient ID: William [name], male   DOB: [birth year], 68 y.o.   MRN: [id number]  Have reviewed team conference with pt, daughter and son and all aware and agreeable with targeted d/c date of 3/6.  Explained that goals have been set for supervision, however, this is dependent that pt continues to show improving cognition (family feels they are seeing some improvement over the past couple of days.)  Daughter inquiring about whether SNF might be an option if he does not improve.  Explained that he only has Medicare Part A which would only potentially cover 20 days.  Pt's supplement is [employer] which does not cover SNF at all.  Plan to follow back up with daughter tomorrow.  Elyna Pangilinan, LCSW

## 2018-07-15 NOTE — Plan of Care (Signed)
Nutrition Education Note  Note: pt is on a Regular diet at this time. Family requests diet being changed to carb modified. Please order if appropriate.  RD consulted for nutrition education regarding diabetes. Spoke with pt and son in room. Pt's son unaware of pt's new diagnosis of diabetes and states that pt's sugar was 129 today "which is good." RD explained hemoglobin A1C value and what that means about pt's blood sugars over the last 2-3 months prior to the lab being drawn. Discussed the difference between a CBG and hemoglobin A1C.  Discussed pt's normal diet with pt and son at bedside. Pt reports typically eating two meals daily (would skip breakfast due to waking up early). Lunch may include chicken wings in hot sauce (not fried) dipped in ranch dressing. Dinner may include starch, vegetable, meat. Pt states that he likes to eat ice cream. RD discussed appropriate portion sizes of some of pt's typically consumed meals.  Pt's son with multiple questions about specific foods including chicken wings, bread, beverages, and ice cream. All questions answered.  Lab Results  Component Value Date   HGBA1C 7.5 (H) 07/12/2018    RD provided "Carbohydrate Counting for People with Diabetes" handout from the Academy of [employer] and Dietetics. Discussed different food groups and their effects on blood sugar, emphasizing carbohydrate-containing foods. Provided list of carbohydrates and recommended serving sizes of common foods. Discussed carbohydrate servings per meals and snacks with pt. Also discussed importance of balancing meals with protein and non-starchy vegetables. Provided examples.  Discussed importance of controlled and consistent carbohydrate intake throughout the day. Provided examples of ways to balance meals/snacks and encouraged intake of high-fiber, whole grain complex carbohydrates. Teach back method used.  Expect good compliance.  Body mass index is 28.26 kg/m. Pt meets criteria for  overweight based on current BMI.  Current diet order is Regular, patient is consuming approximately 50-75% of meals at this time. Labs and medications reviewed. No further nutrition interventions warranted at this time. RD contact information provided. If additional nutrition issues arise, please re-consult RD.   Gaynell Face, MS, RD, LDN Inpatient Clinical Dietitian Pager: [phone] Weekend/After Hours: [phone]

## 2018-07-15 NOTE — Progress Notes (Signed)
Occupational Therapy Session Note  Patient Details  Name: William [name] MRN: [id number] Date of Birth: [birth year]  Today's Date: 07/15/2018 OT Individual Time: 6834-1962 OT Individual Time Calculation (min): 74 min    Short Term Goals: Week 1:  OT Short Term Goal 1 (Week 1): Pt will maintain sustained attention to selfcare tasks for 30 mins with no more than min instructional cueing for re-direction. OT Short Term Goal 2 (Week 1): Pt will completed donning and doffing of his back brace with setup only. OT Short Term Goal 3 (Week 1): Pt will complete LB dressing with AE and min assist sit to stand.  OT Short Term Goal 4 (Week 1): Pt will perform walk-in shower transfer with supervision to shower seat. OT Short Term Goal 5 (Week 1): Pt will state 3/3 back precautions with supervision for two consecutive sessions.  Skilled Therapeutic Interventions/Progress Updates:    1:1. Pt reporting pain in back does dot rate, however RN delivering pain medicaiton. Pt requires MOD VC throughout session for maintaining back precautions functionally throughout transfers and ADLs. Pt completes stand pivot transfer with hand held assist and grab bar use EOB<>w/c<>TTB in shower. Pt begins to shower seated on TTB however water hose is broken. Transferred out of shower and dried off. sonned shirt with set up and brace with VC for no twisting while donning LSO. Pt dons brief and pants with reacher and min A. Pt completes socks and shoes with shoe funnel and sock aide and mod VC for AE use. Exited session with pt returned to bed, call light in reach and all needs met  Therapy Documentation Precautions:  Precautions Precautions: Back, Fall Required Braces or Orthoses: Spinal Brace Spinal Brace: Applied in sitting position Restrictions Weight Bearing Restrictions: No General:   Vital Signs:  Pain: Pain Assessment Pain Scale: 0-10 Pain Score: 6  Pain Type: Acute pain Pain Location: Abdomen Pain  Orientation: Lower Pain Descriptors / Indicators: Cramping Pain Intervention(s): Medication (See eMAR) ADL: ADL Grooming: Setup Where Assessed-Grooming: Wheelchair Upper Body Bathing: Supervision/safety Where Assessed-Upper Body Bathing: Wheelchair Lower Body Bathing: Maximal assistance Where Assessed-Lower Body Bathing: Wheelchair Upper Body Dressing: Maximal assistance Where Assessed-Upper Body Dressing: Wheelchair Lower Body Dressing: Maximal assistance Where Assessed-Lower Body Dressing: Wheelchair Toileting: Maximal assistance Where Assessed-Toileting: Recruitment consultant Transfer: Maximal assistance Toilet Transfer Method: Arts development officer: Art gallery manager    Praxis   Exercises:   Other Treatments:     Therapy/Group: Individual Therapy  Tonny Branch 07/15/2018, 9:31 AM

## 2018-07-15 NOTE — Progress Notes (Signed)
Patient alert and verbally responsive; redirected and educated several times throughout shift. Remains monitored by tele monitor and staff; meds as ordered. Continues to display inappropriate disrobing and behaviors this shift. Patient  remains total care this shift. Sleep appropriately 4-5 hours this shift. Bed in lowest position and bed alarm on and intact. Will continue to monitor.

## 2018-07-15 NOTE — Progress Notes (Signed)
Speech Language Pathology Daily Session Note  Patient Details  Name: William [name] MRN: [id number] Date of Birth: 13-Dec-1950  Today's Date: 07/15/2018 SLP Individual Time: 1103-1202 SLP Individual Time Calculation (min): 59 min  Short Term Goals: Week 1: SLP Short Term Goal 1 (Week 1): Pt will complete mildly complex probem solving tasks with mod assist verbal/visual cues. SLP Short Term Goal 2 (Week 1): Pt will sustain attention to tasks for at least 10 minutes with mod assist verbal cues for redirection.  SLP Short Term Goal 3 (Week 1): Pt will demonstrate intellectual awareness by identifying 2 of his current deficits with mod assist. SLP Short Term Goal 4 (Week 1): Pt will recall information with mod assist to utilize compensatory memory strategies/aids. SLP Short Term Goal 5 (Week 1): Pt will follow at least 2-step directions to complete functional tasks with no more than 2 repetitions from clinician.   Skilled Therapeutic Interventions: Pt was seen for skilled ST focused on cognition. Pt's mentation appears to be improving since previous sessions; he was appropriate upon greeting, demonstrated increased intellectual and emergent awareness evidenced in his willingness to discuss his perceived difficulty of some tasks and ability to detect and correct errors during structured tasks. Pt required min verbal cues for accuracy and attention to detail when finding specific information and interpreting instructions on prescription labels. He also required supervision-min verbal cues for accuracy to organize a weekly pill box. Pt redirected his attention to tasks with supervision verbal cues in a controlled environment. Pt would benefit from the challenge of sustaining attention in a mildly distracting environment during future sessions as appropriate. Pt was left laying in bed with bed alarm set and call bell within reach. Recommend continue per current plan of care.      Pain Pain  Assessment Pain Score: 0-No pain  Therapy/Group: Individual Therapy   Jettie Booze, Student SLP   Jettie Booze 07/15/2018, 2:32 PM

## 2018-07-15 NOTE — Progress Notes (Signed)
[place] PHYSICAL MEDICINE & REHABILITATION PROGRESS NOTE  Subjective/Complaints: Patient seen laying in bed this morning.  He slept intermittently throughout the night.  Sleeping this morning.  ROS: Limited due to lethargy, however appears to deny CP, shortness of breath, nausea, vomiting, diarrhea.  Objective: Vital Signs: Blood pressure (!) 154/71, pulse 88, temperature 98.2 F (36.8 C), temperature source Oral, resp. rate 18, height 5\' 11"  (1.803 m), weight 91.9 kg, SpO2 95 %. Ct Head Wo Contrast  Result Date: 07/13/2018 CLINICAL DATA:  Headache after a fall. EXAM: CT HEAD WITHOUT CONTRAST TECHNIQUE: Contiguous axial images were obtained from the base of the skull through the vertex without intravenous contrast. COMPARISON:  06/11/2012 FINDINGS: Brain: Diffuse cerebral atrophy. Ventricular dilatation is likely due to central atrophy, although the degree of dilatation is out of proportion to the degree of sulcal atrophy and this could also represent normal pressure hydrocephalus in the appropriate clinical setting. No mass effect or midline shift. No abnormal extra-axial fluid collections. Gray-white matter junctions are distinct. Basal cisterns are not effaced. No acute intracranial hemorrhage. Vascular: Moderate intracranial arterial calcifications. Skull: Calvarium appears intact. No acute depressed skull fractures. Sinuses/Orbits: Paranasal sinuses and mastoid air cells are clear. Other: None. IMPRESSION: 1. No acute intracranial abnormalities. Chronic atrophy and small vessel ischemic changes. 2. Ventricular dilatation is somewhat out of proportion to the degree of sulcal atrophy. Possible normal pressure hydrocephalus versus prominent central atrophy. Electronically Signed   By: Lucienne Capers M.D.   On: 07/13/2018 21:48   Recent Labs    07/15/18 0650  WBC 5.3  HGB 12.3*  HCT 36.1*  PLT 265   Recent Labs    07/12/18 0938 07/15/18 0650  NA 132* 133*  K 4.0 4.2  CL 97* 101   CO2 23 23  GLUCOSE 177* 142*  BUN 23 19  CREATININE 1.12 0.96  CALCIUM 10.1 9.3    Physical Exam: BP (!) 154/71 (BP Location: Left Arm)   Pulse 88   Temp 98.2 F (36.8 C) (Oral)   Resp 18   Ht 5\' 11"  (1.803 m)   Wt 91.9 kg   SpO2 95%   BMI 28.26 kg/m  Constitutional: No distress . Vital signs reviewed. HENT: Normocephalic.  Atraumatic. Eyes: EOMI. No discharge. Cardiovascular: RRR.  No JVD. Respiratory: CTA bilaterally.  Normal effort. GI: BS +. Non-distended. Musc: No edema or tenderness in extremities. Neuro: Somnolent Follows commands. Motor: Grossly 4+/5 throughout, stable Skin: Back with Steri-Strips C/D/I, not examined today Psych: Normal mood.  Normal behavior.  Assessment/Plan: 1. Functional deficits secondary to encephalopathy status post epidural hematoma with laminectomy which require 3+ hours per day of interdisciplinary therapy in a comprehensive inpatient rehab setting.  Physiatrist is providing close team supervision and 24 hour management of active medical problems listed below.  Physiatrist and rehab team continue to assess barriers to discharge/monitor patient progress toward functional and medical goals  Care Tool:  Bathing    Body parts bathed by patient: Right arm, Left arm, Chest, Abdomen, Front perineal area, Buttocks, Right upper leg, Left upper leg, Right lower leg, Left lower leg, Face   Body parts bathed by helper: Chest, Front perineal area, Buttocks, Left lower leg, Right lower leg Body parts n/a: Right lower leg, Left lower leg   Bathing assist Assist Level: Minimal Assistance - Patient > 75%     Upper Body Dressing/Undressing Upper body dressing   What is the patient wearing?: Pull over shirt, Orthosis    Upper body assist Assist  Level: Minimal Assistance - Patient > 75%    Lower Body Dressing/Undressing Lower body dressing      What is the patient wearing?: Incontinence brief, Pants     Lower body assist Assist for  lower body dressing: Moderate Assistance - Patient 50 - 74%     Toileting Toileting    Toileting assist Assist for toileting: Moderate Assistance - Patient 50 - 74%     Transfers Chair/bed transfer  Transfers assist     Chair/bed transfer assist level: Moderate Assistance - Patient 50 - 74%     Locomotion Ambulation   Ambulation assist      Assist level: Moderate Assistance - Patient 50 - 74% Assistive device: Walker-rolling Max distance: 25   Walk 10 feet activity   Assist  Walk 10 feet activity did not occur: Safety/medical concerns  Assist level: Moderate Assistance - Patient - 50 - 74% Assistive device: Walker-rolling   Walk 50 feet activity   Assist Walk 50 feet with 2 turns activity did not occur: Safety/medical concerns         Walk 150 feet activity   Assist Walk 150 feet activity did not occur: Safety/medical concerns         Walk 10 feet on uneven surface  activity   Assist Walk 10 feet on uneven surfaces activity did not occur: Safety/medical concerns         Wheelchair     Assist   Type of Wheelchair: Manual    Wheelchair assist level: Moderate Assistance - Patient 50 - 74% Max wheelchair distance: 75    Wheelchair 50 feet with 2 turns activity    Assist        Assist Level: Moderate Assistance - Patient 50 - 74%   Wheelchair 150 feet activity     Assist Wheelchair 150 feet activity did not occur: Safety/medical concerns(100 ft max per PT note)          Medical Problem List and Plan: 1. 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 DVT Prophylaxis/Anticoagulation: Mechanical:Sequential compression devices, below kneeBilateral lower extremities 3. Pain Management:Tylenol as needed for now. Local measures with heat and/or  ice.  4. Mood:LCSW to follow for evaluation and support. 5. Neuropsych: This patientis not fullycapable of making decisions onhisown behalf. 6. Skin/Wound Care:Monitor wound for healing and for any recurrent drainage. Monitor for signs of infection. 7. Fluids/Electrolytes/Nutrition:Monitor I's and O's. Offer nutritional supplements as with variable p.o. intake  BMP within acceptable range on 2/21, labs pending 8. Delirium: Not fully resolved. Continue to monitor.  Discontinued Norco and flexeril--has not had any in >24 hours.   Acute lower UTI  UA negative, urine culture greater than 100,000 Klebsiella.   Keflex started on 2/24 x7 days  Monitor for other signs of infection.  9.HTN: Monitor blood pressures twice daily. Continue Cozaar, Toprol-XL and amlodipine  Labile on 2/27  Monitor with increased mobility 10.History of bladder cancer/BPH:Recent episode of urinary retention. Monitor voiding with cath for volumes greater than 350 cc.  Flomax started 11.Epidural hematoma/ABLA: Monitor for signs of bleeding  Hemoglobin 12.3 on 2/27 12.GERD: Continue Protonix 13.  Diabetes mellitus type 2  Hemoglobin A1c 7.5  Metformin 250 daily started on 2/25  Labile on 2/27 14.  Hypoalbuminemia  Supplement initiated on 2/21 15.  Transaminitis  LFTs elevated, but improving on 2/25  Avoid hepatotoxic meds 16.  Hyponatremia  Sodium 133 on 2/27  Continue to monitor 17. Sundowning  Sleep chart ordered  Trazodone DC'd on 2/27, Restoril started on 2/27    LOS: 7 days A FACE TO FACE EVALUATION WAS PERFORMED  Beverley Allender Lorie Phenix 07/15/2018, 9:07 AM

## 2018-07-15 NOTE — Progress Notes (Addendum)
Inpatient Diabetes Program Recommendations  AACE/ADA: New Consensus Statement on Inpatient Glycemic Control (2015)  Target Ranges:  Prepandial:   less than 140 mg/dL      Peak postprandial:   less than 180 mg/dL (1-2 hours)      Critically ill patients:  140 - 180 mg/dL   Lab Results  Component Value Date   GLUCAP 134 (H) 07/15/2018   HGBA1C 7.5 (H) 07/12/2018    Note diabetes coordinator consult for new diagnosis/need to educate family. Started on Metformin 250 mg daily.    Ordered "Living Well with Diabetes" booklet for patient and dietician consult. Requested nursing staff to explain taking CBG and to have patient/family practice as he/family will be checking at home.    Spoke to RN and she stated patient will be available after 3 pm and family or significant other needs to be there for diabetes teaching.     MD - at discharge please order the following:    1. Blood glucose meter kit (includes lancets and strips) #[id number]   2. Ambulatory referral to Nutrition and Diabetes Education   Update :  Spoke to patient and son about diabetes and current A1c of 7.5% (average of 169 mg/dl). Explained what an A1c is and what it measures. Informed patient that goal A1c is 7% or less per ADA standards to prevent both acute and long-term complications. Explained to patient the extreme importance of good glucose control at home. Encouraged patient to check his CBGs at least bid at home (fasting and another check within the day) and to record all CBGs in a logbook for MD to review. Emphasized importance of keeping follow up appointments with PCP. Discussed food/drink and reinforced what dietician spoke with them about earlier today.   Reviewed s/s hypo/hyperglycemia and how to treat. All questions answered.    Thank you.  -- Will follow during hospitalization.--  Jonna Clark RN, MSN Diabetes Coordinator Inpatient Glycemic Control Team Team Pager: [phone] (8am-5pm)

## 2018-07-15 NOTE — Patient Care Conference (Signed)
Inpatient RehabilitationTeam Conference and Plan of Care Update Date: 07/14/2018   Time: 2:40 PM    Patient Name: William [name]      Medical Record Number: [id number]  Date of Birth: Oct 10, 1950 Sex: Male         Room/Bed: 4M05C/4M05C-01 Payor Info: Payor: MEDICARE / Plan: MEDICARE PART A / Product Type: *No Product type* /    Admitting Diagnosis: lumbar decompression with epidural hematoma  Admit Date/Time:  07/08/2018  4:03 PM Admission Comments: No comment available   Primary Diagnosis:  Epidural hematoma (HCC) Principal Problem: Epidural hematoma [facility])  Patient Active Problem List   Diagnosis Date Noted  . Sundowning   . Fall   . Hyponatremia   . New onset type 2 diabetes mellitus ([place])   . Acute lower UTI   . Labile blood pressure   . Transaminitis   . Hypoalbuminemia due to protein-calorie malnutrition ([place])   . Hyperglycemia   . Acute delirium   . Urinary incontinence due to benign prostatic hyperplasia 07/08/2018  . Spinal stenosis of lumbar region with radiculopathy 07/08/2018  . Epidural hematoma ([place]) 07/05/2018  . Acute urinary retention 07/04/2018  . Lumbar stenosis with neurogenic claudication 02/24/2018  . S/P total knee replacement 12/08/2016    Expected Discharge Date: Expected Discharge Date: 07/23/18  Team Members Present: Physician leading conference: Dr. Delice Lesch Social Worker Present: Lennart Pall, LCSW Nurse Present: Dorien Chihuahua, RN PT Present: Barrie Folk, PT OT Present: Clyda Greener, OT SLP Present: Windell Moulding, SLP PPS Coordinator present : Gunnar Fusi     Current Status/Progress Goal Weekly Team Focus  Medical   Debility and cognitive deficits secondary to Epidural hematoma, post op lumbar laminectomy with encephalopathy  Improve mobility, sundowning, hyponatremia, UTI, HTN/DM, LFTs  See above   Bowel/Bladder   Patient has incontinent episodes of bowel and urine. Patient will call to use urinal at times. output is usually  50-150 ml each occurrence. Will continue to assess  LBM 2/      Swallow/Nutrition/ Hydration             ADL's   supervision for UB bathing with mod assist for UB dressing to donn back brace correctly.  Min assist for functional transfers to the toilet, wheelchair, and shower with use of the RW.  Still with decreased ability to follow back precautions as well as decreased memory and problem solving.    supervision  selfcare retraining, balance retraining, DME/AE education, pt/amily education, therapeutic activities   Mobility   min-mod assist bed mobility. Min assist sit<>stand transfers. min-mod assist stand pivot transfer with RW with varying degrees of safety. min assist ambulation with RW x 25    Min-supervision assist with RW at ambulatory level.   improved safety awareness of back precautions. increased independence with bed mobility, transfers, and WC mobility.    Communication             Safety/Cognition/ Behavioral Observations  fluctuating mod-max assist basic problem solving, max assist for sustained attention (believed to greatly influence other aspects of cognition), signs of self-monitoring for attention and intelletual awareness emerging today  Supervision  Sustained attention, awareness, basic problem solving   Pain   Patient complains of lower back pain 6/10. PRN administered per MD order. Will continue to assess pain/discomfort         Skin   Patient has steri strips in place to lower back from prior surgery. Steri strips intact, dry, no s/s of infection noted. will continue  to assess and maintain skin integrity           Rehab Goals Patient on target to meet rehab goals: No Rehab Goals Revised: confusion continues and affecting gains *See Care Plan and progress notes for long and short-term goals.     Barriers to Discharge  Current Status/Progress Possible Resolutions Date Resolved   Physician    Medical stability;Decreased caregiver support;Lack of/limited family  support     See above  Therapies, optimize sleep/wake, floow labs, optimize DM/HTN, abx for UTI      Nursing                  PT                    OT                  SLP                SW                Discharge Planning/Teaching Needs:  Plan on admission is for pt to d/c home with intermittent support, however, may now need 24/7 and daughter considering SNF as an option.  Teaching needs TBD   Team Discussion:  Now with UTI and 2 days into abx treatment.  MD hopeful this will encourage cognitive improvements.  Need to check Hgb tomorrow.  MD adding sleep chart as well.  Pt still significantly confused and this has definitely affected progress and no ability to carryover info.  Cannot follow back precautions.  Min-mod assist with tfs plus max safety cues.  Goals currently at supervision, however, need to be downgraded.  Revisions to Treatment Plan:  NA    Continued Need for Acute Rehabilitation Level of Care: The patient requires daily medical management by a physician with specialized training in physical medicine and rehabilitation for the following conditions: Daily direction of a multidisciplinary physical rehabilitation program to ensure safe treatment while eliciting the highest outcome that is of practical value to the patient.: Yes Daily medical management of patient stability for increased activity during participation in an intensive rehabilitation regime.: Yes Daily analysis of laboratory values and/or radiology reports with any subsequent need for medication adjustment of medical intervention for : Neurological problems;Diabetes problems;Other;Blood pressure problems   I attest that I was present, lead the team conference, and concur with the assessment and plan of the team.   Kynzli Rease 07/15/2018, 11:56 AM

## 2018-07-15 NOTE — Progress Notes (Signed)
Physical Therapy Session Note  Patient Details  Name: William [name] MRN: [id number] Date of Birth: 24-Mar-1951  Today's Date: 07/14/2018 PT Individual Time:1700-1730    30 min   Short Term Goals: Week 1:  PT Short Term Goal 1 (Week 1): Pt will sit EOB with supervision assist from PT PT Short Term Goal 2 (Week 1): PT will ambulate 17ft with mod assist and LRAD  PT Short Term Goal 3 (Week 1): Pt will propell WC 160ft with supevisoin assist  PT Short Term Goal 4 (Week 1): Pt will initiate stain management training   Skilled Therapeutic Interventions/Progress Updates: Pt received supine in bed and agreeable to PT. Supine>sit transfer with min assist and max cues to maintain back precautions. Stand pivot transfer to [facility] with mod assist. Pt transported to rehab gym in [facility].   Gait training x 25 ft with RW  And mod assist for safety and AD management . Pt noted to have short festinating gait and poor carryover with cues to improve step length of ofr improved safety with RW management.   WC mobility x 75 ft with mod assist from PT for improved attention to task and equal use of BUE.   Stand pivot transfer to return to bed with min assist, pt noted to have been incontinent standing balance while PT managed clothing by PT. Min-mod assist for safety in standing with BUE on RW. Sit>supine with max assist to maintain back precautions. Pt left in bed with daughter present.         Therapy Documentation Precautions:  Precautions Precautions: Back, Fall Required Braces or Orthoses: Spinal Brace Spinal Brace: Applied in sitting position Restrictions Weight Bearing Restrictions: No General:   missed time : 30 min - pt fatigue.  Vital Signs: Therapy Vitals Temp: 98.2 F (36.8 C) Temp Source: Oral Pulse Rate: 88 Resp: 18 BP: (!) 154/71 Patient Position (if appropriate): Lying Oxygen Therapy SpO2: 95 % O2 Device: Room Air Pain: Pain Assessment Pain Scale: 0-10 Pain Score: 6  Pain  Type: Acute pain Pain Location: Abdomen Pain Orientation: Lower Pain Descriptors / Indicators: Cramping Pain Intervention(s): Medication (See eMAR)    Therapy/Group: Individual Therapy  Lorie Phenix 07/15/2018, 7:44 AM

## 2018-07-15 NOTE — Progress Notes (Signed)
Physical Therapy Session Note  Patient Details  Name: William [name] MRN: [id number] Date of Birth: 1950/11/22  Today's Date: 07/15/2018 PT Individual Time: 1405-1500 PT Individual Time Calculation (min): 55 min   Short Term Goals: Week 1:  PT Short Term Goal 1 (Week 1): Pt will sit EOB with supervision assist from PT PT Short Term Goal 2 (Week 1): PT will ambulate 80f with mod assist and LRAD  PT Short Term Goal 3 (Week 1): Pt will propell WC 1016fwith supevisoin assist  PT Short Term Goal 4 (Week 1): Pt will initiate stain management training   Skilled Therapeutic Interventions/Progress Updates:   Pt received supine in bed and agreeable to PT. Supine>sit transfer with min assist and min cues for back precautions. Pt noted to have greatly improved problem solving and awareness on this day compared to previous sessions.   [facility] and min assist from PT. Min cues from PT for improved step length intermittently throughout session. Car transfers training with RW and min assist for safety. Multimodal cues for safety and sit>pivot technique.   WC mobility training x 14069fith min assist from PT and min cues for increase use of LUE to prevent hitting wall.   Gait training with RW 2 x 60 with min assist from PT. Intermittent cues for posture and step length with significantly improved gait pattern following cues from PT.   Stair management training x 4 steps with BUE support and min assist from PT. Pt self selected step over step ascent and cues from PT for step to descent to improve safety and proper use of BUE.   Patient returned to room and [facility] reach and all needs met.        Therapy Documentation Precautions:  Precautions Precautions: Back, Fall Required Braces or Orthoses: Spinal Brace Spinal Brace: Applied in sitting position Restrictions Weight Bearing Restrictions: No Vital Signs: Therapy Vitals Temp: 97.6  F (36.4 C) Pulse Rate: 94 Resp: 14 BP: (!) 138/111 Patient Position (if appropriate): Lying Oxygen Therapy SpO2: 97 % O2 Device: Room Air Pain: Pain Assessment Pain Score: 0-No pain    Therapy/Group: Individual Therapy  AusLorie Phenix27/2020, 3:38 PM

## 2018-07-16 ENCOUNTER — Inpatient Hospital Stay (HOSPITAL_COMMUNITY): Payer: Federal, State, Local not specified - PPO | Admitting: Speech Pathology

## 2018-07-16 ENCOUNTER — Inpatient Hospital Stay (HOSPITAL_COMMUNITY): Payer: Federal, State, Local not specified - PPO | Admitting: Physical Therapy

## 2018-07-16 ENCOUNTER — Inpatient Hospital Stay (HOSPITAL_COMMUNITY): Payer: Federal, State, Local not specified - PPO | Admitting: Occupational Therapy

## 2018-07-16 DIAGNOSIS — R7309 Other abnormal glucose: Secondary | ICD-10-CM

## 2018-07-16 LAB — GLUCOSE, CAPILLARY
Glucose-Capillary: 128 mg/dL — ABNORMAL HIGH (ref 70–99)
Glucose-Capillary: 116 mg/dL — ABNORMAL HIGH (ref 70–99)
Glucose-Capillary: 115 mg/dL — ABNORMAL HIGH (ref 70–99)
Glucose-Capillary: 197 mg/dL — ABNORMAL HIGH (ref 70–99)

## 2018-07-16 LAB — BASIC METABOLIC PANEL
ANION GAP: 10 (ref 5–15)
BUN: 21 mg/dL (ref 8–23)
CO2: 25 mmol/L (ref 22–32)
Calcium: 9.6 mg/dL (ref 8.9–10.3)
Chloride: 101 mmol/L (ref 98–111)
Creatinine, Ser: 1.01 mg/dL (ref 0.61–1.24)
GFR calc Af Amer: >60 mL/min (ref >60)
GFR calc non Af Amer: >60 mL/min (ref >60)
Glucose, Bld: 134 mg/dL — ABNORMAL HIGH (ref 70–99)
Potassium: 4 mmol/L (ref 3.5–5.1)
Sodium: 136 mmol/L (ref 135–145)

## 2018-07-16 NOTE — Progress Notes (Signed)
Pt refused cpap

## 2018-07-16 NOTE — Plan of Care (Signed)
  Problem: Consults Goal: Skin Care Protocol Initiated - if Braden Score 18 or less Description If consults are not indicated, leave blank or document N/A Outcome: Progressing Goal: RH GENERAL PATIENT EDUCATION Description See Patient Education module for education specifics. Outcome: Progressing

## 2018-07-16 NOTE — Progress Notes (Signed)
Patient alert and oriented; c/o of back pain X 1, Prn meds given as requested for pain, relief voiced. Tele monitor in place. Patient remains on sleep chart at bedtime; patient slept 5-6 hrs shift this shift. Will continue to monitor.

## 2018-07-16 NOTE — Progress Notes (Signed)
Physical Therapy Weekly Progress Note  Patient Details  Name: William [name] MRN: [id number] Date of Birth: 1950/05/26  Beginning of progress report period: July 09, 2018 End of progress report period: July 16, 2018  Today's Date: 07/16/2018 PT Individual Time: 0810-0915 PT Individual Time Calculation (min): 65 min   Patient has met 4 of 4 short term goals.  Pt making slow progress towards Long term goals due to continued altered mental status that has improved slightly over the past 2 days. Pt currently requires supervision assist for bed mobility, CGA for transfers and gait up to 6f. And min assist for WC and stair management.   Patient continues to demonstrate the following deficits muscle weakness and muscle joint tightness, decreased cardiorespiratoy endurance, decreased attention, decreased awareness, decreased problem solving, decreased safety awareness and decreased memory and decreased sitting balance, decreased standing balance and decreased balance strategies and therefore will continue to benefit from skilled PT intervention to increase functional independence with mobility.  Patient progressing toward long term goals..  Continue plan of care.  PT Short Term Goals Week 1:  PT Short Term Goal 1 (Week 1): Pt will sit EOB with supervision assist from PT PT Short Term Goal 1 - Progress (Week 1): Met PT Short Term Goal 2 (Week 1): PT will ambulate 324fwith mod assist and LRAD  PT Short Term Goal 2 - Progress (Week 1): Met PT Short Term Goal 3 (Week 1): Pt will propell WC 1002fith supevisoin assist  PT Short Term Goal 3 - Progress (Week 1): Met PT Short Term Goal 4 (Week 1): Pt will initiate stair management training  PT Short Term Goal 4 - Progress (Week 1): Met Week 2:  PT Short Term Goal 1 (Week 2): STG=LTG due to ELOS  Skilled Therapeutic Interventions/Progress Updates:   Pt received supine in bed and agreeable to PT. Supine>sit transfer with supervision  assist and min cues for safety. Sitting balance EOB with supervision assist x 5 minutes for don shirt and back brace. Stand pivot transfer to [name] and [facility].   WC mobility x [id number] supervision assist from PT for improved safety in turns and maintaining straight line.   Standing balance with BUE support on Red wedge with mod assist progressing to CGA-Supervision assist.   Gait training with CGA and RW. 2 x [id number] cues for direction and AD management. Pt noted to have near normal step length throughout gait training on this day  Pt returned to room and performed stand pivot transfer to bed with CGA. And min cues for safety. Sit>supine completed with supervision and left supine in bed with call bell in reach and all needs met.        Therapy Documentation Precautions:  Precautions Precautions: Back, Fall Required Braces or Orthoses: Spinal Brace Spinal Brace: Applied in sitting position Restrictions Weight Bearing Restrictions: No    Vital Signs: Therapy Vitals Temp: 98 F (36.7 C) Temp Source: Oral Pulse Rate: 88 Resp: 18 BP: (!) 142/70 Patient Position (if appropriate): Lying Oxygen Therapy SpO2: 94 % O2 Device: Room Air Pain: 5/10 low back with movement. See MAR   Therapy/Group: Individual Therapy  William [name]/2020, 8:02 AM

## 2018-07-16 NOTE — Progress Notes (Signed)
[facility] PHYSICAL MEDICINE & REHABILITATION PROGRESS NOTE  Subjective/Complaints: Patient seen laying in bed this morning.  He states he did not sleep well overnight.  However, slept well per sleep chart.  ROS: Denies CP, shortness of breath, nausea, vomiting, diarrhea.  Objective: Vital Signs: Blood pressure (!) 142/70, pulse 88, temperature 98 F (36.7 C), temperature source Oral, resp. rate 18, height 5\' 11"  (1.803 m), weight 91.9 kg, SpO2 94 %. No results found. Recent Labs    07/15/18 0650  WBC 5.3  HGB 12.3*  HCT 36.1*  PLT 265   Recent Labs    07/15/18 0650  NA 133*  K 4.2  CL 101  CO2 23  GLUCOSE 142*  BUN 19  CREATININE 0.96  CALCIUM 9.3    Physical Exam: BP (!) 142/70 (BP Location: Right Arm)   Pulse 88   Temp 98 F (36.7 C) (Oral)   Resp 18   Ht 5\' 11"  (1.803 m)   Wt 91.9 kg   SpO2 94%   BMI 28.26 kg/m  Constitutional: No distress . Vital signs reviewed. HENT: Normocephalic.  Atraumatic. Eyes: EOMI. No discharge. Cardiovascular: RRR.  No JVD. Respiratory: CTA bilaterally.  Normal effort. GI: BS +. Non-distended. Musc: No edema or tenderness in extremities. Neuro: Alert and oriented, except for date of month. Follows commands. Motor: Grossly 4+/5 throughout, unchanged Skin: Back with Steri-Strips C/D/I, not examined today Psych: Normal mood.  Normal behavior.  Assessment/Plan: 1. Functional deficits secondary to encephalopathy status post epidural hematoma with laminectomy which require 3+ hours per day of interdisciplinary therapy in a comprehensive inpatient rehab setting.  Physiatrist is providing close team supervision and 24 hour management of active medical problems listed below.  Physiatrist and rehab team continue to assess barriers to discharge/monitor patient progress toward functional and medical goals  Care Tool:  Bathing    Body parts bathed by patient: Right arm, Left arm, Chest, Abdomen, Front perineal area, Buttocks,  Right upper leg, Left upper leg, Right lower leg, Left lower leg, Face   Body parts bathed by helper: Chest, Front perineal area, Buttocks, Left lower leg, Right lower leg Body parts n/a: Right lower leg, Left lower leg   Bathing assist Assist Level: Minimal Assistance - Patient > 75%     Upper Body Dressing/Undressing Upper body dressing   What is the patient wearing?: Pull over shirt, Orthosis    Upper body assist Assist Level: Minimal Assistance - Patient > 75%    Lower Body Dressing/Undressing Lower body dressing      What is the patient wearing?: Incontinence brief, Pants     Lower body assist Assist for lower body dressing: Moderate Assistance - Patient 50 - 74%     Toileting Toileting    Toileting assist Assist for toileting: Moderate Assistance - Patient 50 - 74%     Transfers Chair/bed transfer  Transfers assist     Chair/bed transfer assist level: Minimal Assistance - Patient > 75%     Locomotion Ambulation   Ambulation assist      Assist level: Minimal Assistance - Patient > 75% Assistive device: Walker-rolling Max distance: 30   Walk 10 feet activity   Assist  Walk 10 feet activity did not occur: Safety/medical concerns  Assist level: Minimal Assistance - Patient > 75% Assistive device: Walker-rolling   Walk 50 feet activity   Assist Walk 50 feet with 2 turns activity did not occur: Safety/medical concerns         Walk 150 feet  activity   Assist Walk 150 feet activity did not occur: Safety/medical concerns         Walk 10 feet on uneven surface  activity   Assist Walk 10 feet on uneven surfaces activity did not occur: Safety/medical concerns         Wheelchair     Assist   Type of Wheelchair: Manual    Wheelchair assist level: Minimal Assistance - Patient > 75% Max wheelchair distance: 144ft    Wheelchair 50 feet with 2 turns activity    Assist        Assist Level: Minimal Assistance - Patient >  75%   Wheelchair 150 feet activity     Assist Wheelchair 150 feet activity did not occur: Safety/medical concerns(100 ft max per PT note)          Medical Problem List and Plan: 1. 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 DVT Prophylaxis/Anticoagulation: Mechanical:Sequential compression devices, below kneeBilateral lower extremities 3. Pain Management:Tylenol as needed for now. Local measures with heat and/or ice.  4. Mood:LCSW to follow for evaluation and support. 5. Neuropsych: This patientis not fullycapable of making decisions onhisown behalf. 6. Skin/Wound Care:Monitor wound for healing and for any recurrent drainage. Monitor for signs of infection. 7. Fluids/Electrolytes/Nutrition:Monitor I's and O's. Offer nutritional supplements as with variable p.o. intake  BMP within acceptable range on 2/21, labs pending 8. Delirium: Continue to monitor.  Discontinued Norco and flexeril  Acute lower UTI  UA negative, urine culture greater than 100,000 Klebsiella.   Keflex started on 2/24 x7 days  Monitor for other signs of infection.  9.HTN: Monitor blood pressures twice daily. Continue Cozaar, Toprol-XL and amlodipine  Labile on 2/28  Monitor with increased mobility 10.History of bladder cancer/BPH:Recent episode of urinary retention. Monitor voiding with cath for volumes greater than 350 cc.  Flomax started 11.Epidural hematoma/ABLA: Monitor for signs of bleeding  Hemoglobin 12.3 on 2/27 12.GERD: Continue Protonix 13.  Diabetes mellitus type 2  Hemoglobin A1c 7.5  Metformin 250 daily started on 2/25  Labile on 2/27, will consider twice daily dosing if persistent afternoon spikes. 14.  Hypoalbuminemia  Supplement initiated on 2/21 15.  Transaminitis  LFTs elevated, but  improving on 2/25  Avoid hepatotoxic meds 16.  Hyponatremia  Sodium 133 on 2/27  Labs ordered for Monday  Continue to monitor 17. Sundowning  Sleep chart ordered  Trazodone DC'd on 2/27, Restoril started on 2/27  Will monitor for now consider further adjustments if necessary   LOS: 8 days A FACE TO FACE EVALUATION WAS PERFORMED  Glen Blatchley Lorie Phenix 07/16/2018, 6:50 AM

## 2018-07-16 NOTE — Progress Notes (Signed)
Occupational Therapy Session Note  Patient Details  Name: William [name] MRN: [id number] Date of Birth: March 02, 1951  Today's Date: 07/16/2018 OT Individual Time: 1300-1400 OT Individual Time Calculation (min): 60 min    Short Term Goals: Week 1:  OT Short Term Goal 1 (Week 1): Pt will maintain sustained attention to selfcare tasks for 30 mins with no more than min instructional cueing for re-direction. OT Short Term Goal 2 (Week 1): Pt will completed donning and doffing of his back brace with setup only. OT Short Term Goal 3 (Week 1): Pt will complete LB dressing with AE and min assist sit to stand.  OT Short Term Goal 4 (Week 1): Pt will perform walk-in shower transfer with supervision to shower seat. OT Short Term Goal 5 (Week 1): Pt will state 3/3 back precautions with supervision for two consecutive sessions.  Skilled Therapeutic Interventions/Progress Updates:    Patient asleep in bed upon arrival with daughter present.  He is pleasant and willing to participate in therapy session.   Bed mobility with CG, occ min A and cues for safe technique, min A to adjust LSO. SPT with RW to/from bed, chair, tub bench with CG A.  Completed 4 walks with RW 75-100 feet with CG and cues for pace/rhythmic mobility (tends to slow and then increase at a more rapid/unsafe pace) Completed unsupported sitting balance/core exercises and UE mobility with focus on scapular stability.  Ate some of his lunch in SSP w/ CS/CG - occ tactile cues to maintain balance in SSP.  Patient with mild confusion but aware that this has been occurring since surgery.  He is oriented to self, month and year today.   Patient returned to bed with alarm set, call bell in reach   Therapy Documentation Precautions:  Precautions Precautions: Back, Fall Required Braces or Orthoses: Spinal Brace Spinal Brace: Applied in sitting position Restrictions Weight Bearing Restrictions: No General:   Vital Signs: Therapy  Vitals Temp: 97.8 F (36.6 C) Temp Source: Oral Pulse Rate: 82 Resp: 18 BP: 116/69 Patient Position (if appropriate): Lying Oxygen Therapy SpO2: 98 % O2 Device: Room Air Pain: Pain Assessment Pain Scale: 0-10 Pain Score: 0-No pain Faces Pain Scale: Hurts a little bit Pain Type: Other (Comment)(back pain - surgical site) Pain Location: Back Pain Descriptors / Indicators: Grimacing Pain Intervention(s): Emotional support;Repositioned Multiple Pain Sites: No Other Treatments:     Therapy/Group: Individual Therapy  Carlos Levering 07/16/2018, 3:47 PM

## 2018-07-16 NOTE — Progress Notes (Signed)
Speech Language Pathology Weekly Progress and Session Note  Patient Details  Name: William [name] MRN: [id number] Date of Birth: June 25, 1950  Beginning of progress report period: July 09, 2018 End of progress report period: July 16, 2018  Today's Date: 07/16/2018 SLP Individual Time: 1101-1200 SLP Individual Time Calculation (min): 59 min  Short Term Goals: Week 1: SLP Short Term Goal 1 (Week 1): Pt will complete mildly complex probem solving tasks with mod assist verbal/visual cues. SLP Short Term Goal 1 - Progress (Week 1): Met SLP Short Term Goal 2 (Week 1): Pt will sustain attention to tasks for at least 10 minutes with mod assist verbal cues for redirection.  SLP Short Term Goal 2 - Progress (Week 1): Met SLP Short Term Goal 3 (Week 1): Pt will demonstrate intellectual awareness by identifying 2 of his current deficits with mod assist. SLP Short Term Goal 3 - Progress (Week 1): Met SLP Short Term Goal 4 (Week 1): Pt will recall information with mod assist to utilize compensatory memory strategies/aids. SLP Short Term Goal 4 - Progress (Week 1): Met SLP Short Term Goal 5 (Week 1): Pt will follow at least 2-step directions to complete functional tasks with no more than 2 repetitions from clinician.  SLP Short Term Goal 5 - Progress (Week 1): Met    New Short Term Goals: Week 2: SLP Short Term Goal 1 (Week 2): STG=LTG due to reamining length of stay  Weekly Progress Updates: Pt has made excellent functional gains this reporting period and has met 5 out of 5 short term goals.  Pt is currently Min assist for most tasks tasks, Mod assist for recall of new information due to cognitive impairments.  Pt has demonstrated improved sustained attention, problem solving, and intellectual awareness.  Pt and family education is ongoing.  Pt would continue to benefit from skilled ST while inpatient in order to maximize functional independence and reduce burden of care prior to discharge.   Anticipate that pt will need 24/7 supervision at discharge in addition to [place] follow up at next level of care.     Intensity: Minumum of 1-2 x/day, 30 to 90 minutes Frequency: 3 to 5 out of 7 days Duration/Length of Stay: 12-14 days Treatment/Interventions: Cognitive remediation/compensation;Environmental controls;Cueing hierarchy;Functional tasks;Internal/external aids;Patient/family education   Daily Session  Skilled Therapeutic Interventions: Pt was seen for skilled ST targeting cognition. SLP facilitated session with Max faded to Mod assist verbal cues to use compensatory memory strategy (word association) during a novel memory game. Of note, pt required less cueing to recall more specific information discussed throughout the game as opposed to broad categories and more abstract word associations. SLP also introduced a novel mildly complex strategy card game, during which pt used appropriate problem solving strategies with min verbal cues; however pt required frequent repetitions and prompts to recall rules of the game. Pt demonstrated impressive ability to self-monitor his attention; he redirected himself to tasks to sustain attention throughout hour long session with only supervision verbal cues.  When returned to his room, pt's daughter was present and SLP discussed pt's progress over last week and goals to target short term memory and complex problem solving throughout remainder of stay. Pt was left in bed with bed alarm set and call bell within reach. Recommend continue per current plan of care.    General    Pain Pain Assessment Pain Scale: Faces Faces Pain Scale: Hurts a little bit Pain Type: Other (Comment)(back pain - surgical site) Pain Location: Back  Pain Descriptors / Indicators: Grimacing Pain Intervention(s): Emotional support;Repositioned Multiple Pain Sites: No  Therapy/Group: Individual Therapy   Jettie Booze, Student SLP   Jettie Booze 07/16/2018, 12:58  PM

## 2018-07-17 ENCOUNTER — Inpatient Hospital Stay (HOSPITAL_COMMUNITY): Payer: Federal, State, Local not specified - PPO | Admitting: Physical Therapy

## 2018-07-17 DIAGNOSIS — G4733 Obstructive sleep apnea (adult) (pediatric): Secondary | ICD-10-CM

## 2018-07-17 LAB — GLUCOSE, CAPILLARY
Glucose-Capillary: 119 mg/dL — ABNORMAL HIGH (ref 70–99)
Glucose-Capillary: 122 mg/dL — ABNORMAL HIGH (ref 70–99)
Glucose-Capillary: 142 mg/dL — ABNORMAL HIGH (ref 70–99)
Glucose-Capillary: 156 mg/dL — ABNORMAL HIGH (ref 70–99)

## 2018-07-17 MED ORDER — QUETIAPINE FUMARATE 25 MG PO TABS
25.0000 mg | ORAL_TABLET | Freq: Every day | ORAL | Status: DC
  Administered 2018-07-17: 25 mg via ORAL
  Filled 2018-07-17: qty 1

## 2018-07-17 NOTE — Plan of Care (Signed)
  Problem: Consults Goal: Skin Care Protocol Initiated - if Braden Score 18 or less Description If consults are not indicated, leave blank or document N/A Outcome: Progressing Goal: RH GENERAL PATIENT EDUCATION Description See Patient Education module for education specifics. Outcome: Progressing   Problem: RH BOWEL ELIMINATION Goal: RH STG MANAGE BOWEL WITH ASSISTANCE Description STG Manage Bowel with Min. Assistance.  Outcome: Progressing Goal: RH STG MANAGE BOWEL W/MEDICATION W/ASSISTANCE Description STG Manage Bowel with Medication with Min.Assistance.  Outcome: Progressing   Problem: RH BLADDER ELIMINATION Goal: RH STG MANAGE BLADDER WITH ASSISTANCE Description STG Manage Bladder With Mod.Assistance  Outcome: Progressing   Problem: RH SKIN INTEGRITY Goal: RH STG SKIN FREE OF INFECTION/BREAKDOWN Description With min. Assist.  Outcome: Progressing Goal: RH STG MAINTAIN SKIN INTEGRITY WITH ASSISTANCE Description STG Maintain Skin Integrity With min.Assistance.  Outcome: Progressing   Problem: RH PAIN MANAGEMENT Goal: RH STG PAIN MANAGED AT OR BELOW PT'S PAIN GOAL Description Less than 3  Outcome: Progressing   Problem: RH KNOWLEDGE DEFICIT GENERAL Goal: RH STG INCREASE KNOWLEDGE OF SELF CARE AFTER HOSPITALIZATION Description Pt. Will be able to verbalized the safety precautions to prevent complication with his back surgery.  Outcome: Progressing   Problem: RH SAFETY Goal: RH STG ADHERE TO SAFETY PRECAUTIONS W/ASSISTANCE/DEVICE Description STG Adhere to Safety Precautions With mod. Assistance/Device.  Outcome: Not Progressing

## 2018-07-17 NOTE — Progress Notes (Signed)
Patient up in chair with family member present. Family member states patient taking longer to speak to her and having hard time getting some words out. Upon assessment patient neuro status appeared mostly unchanged from a.m. Patient taken to toilet to void and able to do a stand pivot with min assist. Patient verbalize name and interactive when staff asks questions. Patient appears more drowsy in chair and a bit slower to answer some questions. Family member informed patient has not had much sleep. Patient assisted back to bed and bed alarm on. MD Kirsteins notified of the occurrence. No new orders received. Continue to monitor.

## 2018-07-17 NOTE — Progress Notes (Signed)
Physical Therapy Session Note  Patient Details  Name: William [name] MRN: [id number] Date of Birth: [birth year]  Today's Date: 07/17/2018 PT Individual Time: 1400-1500 PT Individual Time Calculation (min): 60 min   Short Term Goals: Week 1:  PT Short Term Goal 1 (Week 1): Pt will sit EOB with supervision assist from PT PT Short Term Goal 1 - Progress (Week 1): Met PT Short Term Goal 2 (Week 1): PT will ambulate 35f with mod assist and LRAD  PT Short Term Goal 2 - Progress (Week 1): Met PT Short Term Goal 3 (Week 1): Pt will propell WC 1060fwith supevisoin assist  PT Short Term Goal 3 - Progress (Week 1): Met PT Short Term Goal 4 (Week 1): Pt will initiate stair management training  PT Short Term Goal 4 - Progress (Week 1): Met Week 2:  PT Short Term Goal 1 (Week 2): STG=LTG due to ELOS  Skilled Therapeutic Interventions/Progress Updates:   Pt received supine in bed and agreeable to PT. Supine>sit transfer with supervision assist and min cues for safety. Pt donned LSO sitting EOB with supervision assist. [facility].   Gait training 65 ft +8045fith CGA assist from PT for safety as well as cues for increased step length on the L when fatigued.   Nustep reciprocal endurance training and sustained attention task x 8 minutes with BLE and BUE, level 4>6. Min cues for proper speed and posture on nustep to prevent pain in the low back.   Dynamic balance training to prevent lateral reaches R and L to obtain and throw bean bag to target. 2 x 8 bil. CGA fading to min assist from PT to prevent posterior LOB.   WC mobility x [id number] supervision assist from PT and min cues for awareness of obstacles on the R.   Pt returned to room and performed stand pivot transfer to bed with RW and Supervision assist. Sit>supine completed with min assist due to back pain, and left supine in bed with call bell in reach and all needs met.        Therapy  Documentation Precautions:  Precautions Precautions: Back, Fall Required Braces or Orthoses: Spinal Brace Spinal Brace: Applied in sitting position Restrictions Weight Bearing Restrictions: No Vital Signs: Therapy Vitals Temp: 98.1 F (36.7 C) Temp Source: Oral Pulse Rate: (!) 102 Resp: 16 BP: 105/77 Patient Position (if appropriate): Sitting Oxygen Therapy SpO2: 99 % O2 Device: Room Air Pain:   0/10 at rest.   Therapy/Group: Individual Therapy  AustLorie Phenix9/2020, 3:02 PM

## 2018-07-17 NOTE — Progress Notes (Signed)
Patient refusing SCD and cpap. Patient has been getting up and sitting at the side of the bed several times during this shift with complaint of bed being uncomfortable. Patient repositioned several times. Floor mats in place with continued telemonitor.

## 2018-07-17 NOTE — Progress Notes (Signed)
[facility] PHYSICAL MEDICINE & REHABILITATION PROGRESS NOTE  Subjective/Complaints: Poor sleep according to nurse and sleep chart (4 hr interrupted) Pt uses CPAP at home refused RT last noc when they tried to place CPAP ROS: Denies CP, shortness of breath, nausea, vomiting, diarrhea.  Objective: Vital Signs: Blood pressure (!) 159/80, pulse 100, temperature 97.9 F (36.6 C), temperature source Oral, resp. rate 20, height 5\' 11"  (1.803 m), weight 91.9 kg, SpO2 97 %. No results found. Recent Labs    07/15/18 0650  WBC 5.3  HGB 12.3*  HCT 36.1*  PLT 265   Recent Labs    07/15/18 0650 07/16/18 0633  NA 133* 136  K 4.2 4.0  CL 101 101  CO2 23 25  GLUCOSE 142* 134*  BUN 19 21  CREATININE 0.96 1.01  CALCIUM 9.3 9.6    Physical Exam: BP (!) 159/80 (BP Location: Left Arm)   Pulse 100   Temp 97.9 F (36.6 C) (Oral)   Resp 20   Ht 5\' 11"  (1.803 m)   Wt 91.9 kg   SpO2 97%   BMI 28.26 kg/m  Constitutional: No distress . Vital signs reviewed. HENT: Normocephalic.  Atraumatic. Eyes: EOMI. No discharge. Cardiovascular: RRR.  No JVD. Respiratory: CTA bilaterally.  Normal effort. GI: BS +. Non-distended. Musc: No edema or tenderness in extremities. Neuro: Alert and oriented, except for date of month. Follows commands. Motor: Grossly 4+/5 throughout, unchanged Skin: Back with Steri-Strips C/D/I, not examined today Psych: Normal mood.  Normal behavior.  Assessment/Plan: 1. Functional deficits secondary to encephalopathy status post epidural hematoma with laminectomy which require 3+ hours per day of interdisciplinary therapy in a comprehensive inpatient rehab setting.  Physiatrist is providing close team supervision and 24 hour management of active medical problems listed below.  Physiatrist and rehab team continue to assess barriers to discharge/monitor patient progress toward functional and medical goals  Care Tool:  Bathing    Body parts bathed by patient: Right  arm, Left arm, Chest, Abdomen, Front perineal area, Buttocks, Right upper leg, Left upper leg, Right lower leg, Left lower leg, Face   Body parts bathed by helper: Chest, Front perineal area, Buttocks, Left lower leg, Right lower leg Body parts n/a: Right lower leg, Left lower leg   Bathing assist Assist Level: Minimal Assistance - Patient > 75%     Upper Body Dressing/Undressing Upper body dressing   What is the patient wearing?: Pull over shirt, Orthosis    Upper body assist Assist Level: Minimal Assistance - Patient > 75%    Lower Body Dressing/Undressing Lower body dressing      What is the patient wearing?: Incontinence brief, Pants     Lower body assist Assist for lower body dressing: Moderate Assistance - Patient 50 - 74%     Toileting Toileting    Toileting assist Assist for toileting: Moderate Assistance - Patient 50 - 74%     Transfers Chair/bed transfer  Transfers assist     Chair/bed transfer assist level: Contact Guard/Touching assist     Locomotion Ambulation   Ambulation assist      Assist level: Contact Guard/Touching assist Assistive device: Walker-rolling Max distance: 75   Walk 10 feet activity   Assist  Walk 10 feet activity did not occur: Safety/medical concerns  Assist level: Contact Guard/Touching assist Assistive device: Walker-rolling   Walk 50 feet activity   Assist Walk 50 feet with 2 turns activity did not occur: Safety/medical concerns  Assist level: Contact Guard/Touching assist Assistive device:  Walker-rolling    Walk 150 feet activity   Assist Walk 150 feet activity did not occur: Safety/medical concerns         Walk 10 feet on uneven surface  activity   Assist Walk 10 feet on uneven surfaces activity did not occur: Safety/medical concerns         Wheelchair     Assist   Type of Wheelchair: Manual    Wheelchair assist level: Supervision/Verbal cueing Max wheelchair distance: 150     Wheelchair 50 feet with 2 turns activity    Assist        Assist Level: Supervision/Verbal cueing   Wheelchair 150 feet activity     Assist Wheelchair 150 feet activity did not occur: Safety/medical concerns(100 ft max per PT note)   Assist Level: Supervision/Verbal cueing      Medical Problem List and Plan: 1. 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 DVT Prophylaxis/Anticoagulation: Mechanical:Sequential compression devices, below kneeBilateral lower extremities 3. Pain Management:Tylenol as needed for now. Local measures with heat and/or ice.  4. Mood:LCSW to follow for evaluation and support. 5. Neuropsych: This patientis not fullycapable of making decisions onhisown behalf. 6. Skin/Wound Care:Monitor wound for healing and for any recurrent drainage. Monitor for signs of infection. 7. Fluids/Electrolytes/Nutrition:Monitor I's and O's. Offer nutritional supplements as with variable p.o. intake  BMP within acceptable range on 2/21, labs pending 8. Delirium: Continue to monitor.  Discontinued Norco and flexeril  Acute lower UTI  UA negative, urine culture greater than 100,000 Klebsiella.   Keflex started on 2/24 x7 days  Monitor for other signs of infection.  9.HTN: Monitor blood pressures twice daily. Continue Cozaar, Toprol-XL and amlodipine  Labile on 2/28  Monitor with increased mobility 10.History of bladder cancer/BPH:Recent episode of urinary retention. Monitor voiding with cath for volumes greater than 350 cc.  Flomax started 11.Epidural hematoma/ABLA: Monitor for signs of bleeding  Hemoglobin 12.3 on 2/27 12.GERD: Continue Protonix 13.  Diabetes mellitus type 2  Hemoglobin A1c 7.5  Metformin 250 daily started on 2/25  Labile on 2/27,  will consider twice daily dosing if persistent afternoon spikes. CBG (last 3)  Recent Labs    07/16/18 1716 07/16/18 2107 07/17/18 0655  GLUCAP 115* 197* 119*  controlled 2/29 14.  Hypoalbuminemia  Supplement initiated on 2/21 15.  Transaminitis  LFTs elevated, but improving on 2/25  Avoid hepatotoxic meds 16.  Hyponatremia  Sodium 133 on 2/27  Labs ordered for Monday  Continue to monitor 17. Sundowning  Sleep chart ordered  Trazodone DC'd on 2/27, Restoril started on 2/27- not helpful Will trial seroquel Also pt will call girlfriend to bring in home unit CPAP  Will monitor for now consider further adjustments if necessary   LOS: 9 days A FACE TO FACE EVALUATION WAS PERFORMED  Charlett Blake 07/17/2018, 7:31 AM

## 2018-07-17 NOTE — Progress Notes (Signed)
Occupational Therapy Weekly Progress Note  Patient Details  Name: William [name] MRN: [id number] Date of Birth: April 18, 1951  Beginning of progress report period: July 08, 2018 End of progress report period: July 17, 2018   Patient has met 1 of 5 short term goals.  William [name] continues to make slower progress than expected secondary to decreased cognition and increased pain.  He continues to demonstrate decreased awareness of following his back precautions, even though he is able to state 2/3.  He demonstrates decreased sustained attention with overall needing mod instructional cueing to maintain attention to selfcare tasks for approximately 10-15 mins.  AE has been integrated into selfcare tasks in order for him to adhere to his back precautions, however he continues to need mod instructional cueing to integrate into dressing.  Functional transfers are at a min assist level, with pt at times, not able to take adequate steps and instead exhibiting shuffling feet.  He also continues to need min to mod assist for donning his TLSO in sitting.  Feel progress has been affected by cognition but anticipate as he continues to clear, he will continue to progress to a supervision level.  Family has been educated on the need for pt to have 24 hour supervision at discharge.  Will recommend continued OT at current CIR level with anticipated discharge 3/6.  Patient continues to demonstrate the following deficits: muscle weakness, decreased attention, decreased awareness, decreased problem solving, decreased safety awareness, decreased memory and delayed processing and decreased sitting balance, decreased standing balance, decreased balance strategies and difficulty maintaining precautions and therefore will continue to benefit from skilled OT intervention to enhance overall performance with BADL and Reduce care partner burden.  Patient progressing toward long term goals..  Continue plan of care.  OT  Short Term Goals Week 2:  OT Short Term Goal 1 (Week 2): Continue working on established LTGs set at supervision level overall.   Skilled Therapeutic Interventions/Progress Updates:      Therapy Documentation Precautions:  Precautions Precautions: Back, Fall Required Braces or Orthoses: Spinal Brace Spinal Brace: Applied in sitting position Restrictions Weight Bearing Restrictions: No   Therapy/Group: Individual Therapy  Brittin Janik,Muriel OTR/L 07/17/2018, 4:20 PM

## 2018-07-18 ENCOUNTER — Inpatient Hospital Stay (HOSPITAL_COMMUNITY): Payer: Federal, State, Local not specified - PPO

## 2018-07-18 LAB — GLUCOSE, CAPILLARY
Glucose-Capillary: 132 mg/dL — ABNORMAL HIGH (ref 70–99)
Glucose-Capillary: 112 mg/dL — ABNORMAL HIGH (ref 70–99)
Glucose-Capillary: 172 mg/dL — ABNORMAL HIGH (ref 70–99)
Glucose-Capillary: 125 mg/dL — ABNORMAL HIGH (ref 70–99)

## 2018-07-18 MED ORDER — QUETIAPINE FUMARATE 50 MG PO TABS
50.0000 mg | ORAL_TABLET | Freq: Every day | ORAL | Status: DC
  Administered 2018-07-18 – 2018-07-22 (×5): 50 mg via ORAL
  Filled 2018-07-18 (×5): qty 1

## 2018-07-18 NOTE — Progress Notes (Signed)
Patient has home CPAP unit and is currently on at this time. No assistance needed from RT at this time.

## 2018-07-18 NOTE — Progress Notes (Signed)
Physical Therapy Session Note  Patient Details  Name: William [name] MRN: [id number] Date of Birth: 11-06-1950  Today's Date: 07/18/2018 PT Individual Time: 1300-1356 PT Individual Time Calculation (min): 56 min   Short Term Goals: Week 2:  PT Short Term Goal 1 (Week 2): STG=LTG due to ELOS  Skilled Therapeutic Interventions/Progress Updates:    Session focused on addressing functional transfers with and without AD, gait training with RW with focus on safety and balance during obstacle negotiation (CGA and verbal cues), and NMR to address BLE motor control, strength, and coordination including stair negotiation (single rail to simulate home and occasionally utilized bilateral rails for support) x 12 steps with CGA overall with 1 episode of min assist needed due to L knee buckling, heel/toe raises in standing without UE support with min assist x 10 reps x 2 sets, alternating toe taps x 10 reps each x 2 sets without UE support (min to mod assist due to posterior LOB), and attempts at use of Biodex x 2 attempts but pt with reported dizziness. Upon return to room, BP noted to be a little lower than his normally runs, RN was made aware. Pt request to return to bed and repositioned in supine with supervision for adherence to back precautions.   Therapy Documentation Precautions:  Precautions Precautions: Back, Fall Required Braces or Orthoses: Spinal Brace Spinal Brace: Applied in sitting position Restrictions Weight Bearing Restrictions: No   Vital Signs: BP: 95/59 mmHg Patient Position (if appropriate): Lying  Pain:  Reports ongoing back pain. Denies need for intervention., just rest breaks.    Therapy/Group: Individual Therapy  Lars Masson, PT, DPT, CBIS  07/18/2018, 2:00 PM

## 2018-07-18 NOTE — Progress Notes (Addendum)
[place] PHYSICAL MEDICINE & REHABILITATION PROGRESS NOTE  Subjective/Complaints: Poor sleep according to nurse and sleep chart (~5 hr interrupted) Pt used home CPAP but not very long ROS: Denies CP, shortness of breath, nausea, vomiting, diarrhea.  Objective: Vital Signs: Blood pressure (!) 146/77, pulse 97, temperature 98.4 F (36.9 C), temperature source Oral, resp. rate 18, height 5\' 11"  (1.803 m), weight 91.9 kg, SpO2 96 %. No results found. No results for input(s): WBC, HGB, HCT, PLT in the last 72 hours. Recent Labs    07/16/18 0633  NA 136  K 4.0  CL 101  CO2 25  GLUCOSE 134*  BUN 21  CREATININE 1.01  CALCIUM 9.6    Physical Exam: BP (!) 146/77 (BP Location: Left Arm)   Pulse 97   Temp 98.4 F (36.9 C) (Oral)   Resp 18   Ht 5\' 11"  (1.803 m)   Wt 91.9 kg   SpO2 96%   BMI 28.26 kg/m  Constitutional: No distress . Vital signs reviewed. HENT: Normocephalic.  Atraumatic. Eyes: EOMI. No discharge. Cardiovascular: RRR.  No JVD. Respiratory: CTA bilaterally.  Normal effort. GI: BS +. Non-distended. Musc: No edema or tenderness in extremities. Neuro: Alert and oriented, except for date of month. Follows commands. Motor: Grossly 4+/5 throughout, unchanged Skin: Back with Steri-Strips C/D/I, not examined today Psych: Normal mood.  Normal behavior.  Assessment/Plan: 1. Functional deficits secondary to encephalopathy status post epidural hematoma with laminectomy which require 3+ hours per day of interdisciplinary therapy in a comprehensive inpatient rehab setting.  Physiatrist is providing close team supervision and 24 hour management of active medical problems listed below.  Physiatrist and rehab team continue to assess barriers to discharge/monitor patient progress toward functional and medical goals  Care Tool:  Bathing    Body parts bathed by patient: Right arm, Left arm, Chest, Abdomen, Front perineal area, Buttocks, Right upper leg, Left upper leg,  Right lower leg, Left lower leg, Face   Body parts bathed by helper: Chest, Front perineal area, Buttocks, Left lower leg, Right lower leg Body parts n/a: Right lower leg, Left lower leg   Bathing assist Assist Level: Minimal Assistance - Patient > 75%     Upper Body Dressing/Undressing Upper body dressing   What is the patient wearing?: Pull over shirt, Orthosis    Upper body assist Assist Level: Minimal Assistance - Patient > 75%    Lower Body Dressing/Undressing Lower body dressing      What is the patient wearing?: Incontinence brief, Pants     Lower body assist Assist for lower body dressing: Moderate Assistance - Patient 50 - 74%     Toileting Toileting    Toileting assist Assist for toileting: Moderate Assistance - Patient 50 - 74%     Transfers Chair/bed transfer  Transfers assist     Chair/bed transfer assist level: Supervision/Verbal cueing     Locomotion Ambulation   Ambulation assist      Assist level: Contact Guard/Touching assist Assistive device: Walker-rolling Max distance: 85   Walk 10 feet activity   Assist  Walk 10 feet activity did not occur: Safety/medical concerns  Assist level: Contact Guard/Touching assist Assistive device: Walker-rolling   Walk 50 feet activity   Assist Walk 50 feet with 2 turns activity did not occur: Safety/medical concerns  Assist level: Contact Guard/Touching assist Assistive device: Walker-rolling    Walk 150 feet activity   Assist Walk 150 feet activity did not occur: Safety/medical concerns    Assistive device: Walker-rolling  Walk 10 feet on uneven surface  activity   Assist Walk 10 feet on uneven surfaces activity did not occur: Safety/medical concerns         Wheelchair     Assist   Type of Wheelchair: Manual    Wheelchair assist level: Supervision/Verbal cueing Max wheelchair distance: 150    Wheelchair 50 feet with 2 turns activity    Assist         Assist Level: Supervision/Verbal cueing   Wheelchair 150 feet activity     Assist Wheelchair 150 feet activity did not occur: Safety/medical concerns(100 ft max per PT note)   Assist Level: Supervision/Verbal cueing      Medical Problem List and Plan: 1. 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 DVT Prophylaxis/Anticoagulation: Mechanical:Sequential compression devices, below kneeBilateral lower extremities 3. Pain Management:Tylenol as needed for now. Local measures with heat and/or ice.  4. Mood:LCSW to follow for evaluation and support. 5. Neuropsych: This patientis not fullycapable of making decisions onhisown behalf. 6. Skin/Wound Care:Monitor wound for healing and for any recurrent drainage. Monitor for signs of infection. 7. Fluids/Electrolytes/Nutrition:Monitor I's and O's. Offer nutritional supplements as with variable p.o. intake  BMP within acceptable range on 2/21, labs pending 8. Delirium: Continue to monitor.  Discontinued Norco and flexeril  Acute lower UTI  UA negative, urine culture greater than 100,000 Klebsiella.   Keflex started on 2/24 x7 days  Monitor for other signs of infection.  9.HTN: Monitor blood pressures twice daily. Continue Cozaar, Toprol-XL and amlodipine  Labile on 2/28  Monitor with increased mobility 10.History of bladder cancer/BPH:Recent episode of urinary retention. Monitor voiding with cath for volumes greater than 350 cc.  Flomax started 11.Epidural hematoma/ABLA: Monitor for signs of bleeding  Hemoglobin 12.3 on 2/27 12.GERD: Continue Protonix 13.  Diabetes mellitus type 2  Hemoglobin A1c 7.5  Metformin 250 daily started on 2/25  Labile on 2/27, will consider twice daily dosing if persistent afternoon spikes. CBG  (last 3)  Recent Labs    07/17/18 1655 07/17/18 2049 07/18/18 0637  GLUCAP 142* 156* 132*  controlled 3/1 14.  Hypoalbuminemia  Supplement initiated on 2/21 15.  Transaminitis  LFTs elevated, but improving on 2/25  Avoid hepatotoxic meds 16.  Hyponatremia  Sodium 133 on 2/27  Labs ordered for Monday  Continue to monitor 17. Sundowning  Sleep chart ordered  Trazodone DC'd on 2/27, Restoril started on 2/27- not helpful Will trial seroquel 25mg  not very helpful, increase to 50mg  3/1    LOS: 10 days A FACE TO FACE EVALUATION WAS PERFORMED  Charlett Blake 07/18/2018, 7:21 AM

## 2018-07-18 NOTE — Progress Notes (Signed)
Pt resting comfortably on home CPAP. Hospital CPAP removed from room at this time.

## 2018-07-18 NOTE — Plan of Care (Signed)
  Problem: Consults Goal: Skin Care Protocol Initiated - if Braden Score 18 or less Description If consults are not indicated, leave blank or document N/A Outcome: Progressing Goal: RH GENERAL PATIENT EDUCATION Description See Patient Education module for education specifics. Outcome: Progressing   Problem: RH BOWEL ELIMINATION Goal: RH STG MANAGE BOWEL WITH ASSISTANCE Description STG Manage Bowel with Min. Assistance.  Outcome: Progressing Goal: RH STG MANAGE BOWEL W/MEDICATION W/ASSISTANCE Description STG Manage Bowel with Medication with Min.Assistance.  Outcome: Progressing   Problem: RH BLADDER ELIMINATION Goal: RH STG MANAGE BLADDER WITH ASSISTANCE Description STG Manage Bladder With Mod.Assistance  Outcome: Progressing   Problem: RH SKIN INTEGRITY Goal: RH STG SKIN FREE OF INFECTION/BREAKDOWN Description With min. Assist.  Outcome: Progressing Goal: RH STG MAINTAIN SKIN INTEGRITY WITH ASSISTANCE Description STG Maintain Skin Integrity With min.Assistance.  Outcome: Progressing   Problem: RH SAFETY Goal: RH STG ADHERE TO SAFETY PRECAUTIONS W/ASSISTANCE/DEVICE Description STG Adhere to Safety Precautions With mod. Assistance/Device.  Outcome: Progressing   Problem: RH PAIN MANAGEMENT Goal: RH STG PAIN MANAGED AT OR BELOW PT'S PAIN GOAL Description Less than 3  Outcome: Progressing   Problem: RH KNOWLEDGE DEFICIT GENERAL Goal: RH STG INCREASE KNOWLEDGE OF SELF CARE AFTER HOSPITALIZATION Description Pt. Will be able to verbalized the safety precautions to prevent complication with his back surgery.  Outcome: Progressing

## 2018-07-19 ENCOUNTER — Inpatient Hospital Stay (HOSPITAL_COMMUNITY): Payer: Federal, State, Local not specified - PPO

## 2018-07-19 ENCOUNTER — Inpatient Hospital Stay (HOSPITAL_COMMUNITY): Payer: Federal, State, Local not specified - PPO | Admitting: Speech Pathology

## 2018-07-19 ENCOUNTER — Inpatient Hospital Stay (HOSPITAL_COMMUNITY): Payer: Federal, State, Local not specified - PPO | Admitting: Physical Therapy

## 2018-07-19 DIAGNOSIS — E119 Type 2 diabetes mellitus without complications: Secondary | ICD-10-CM

## 2018-07-19 LAB — BASIC METABOLIC PANEL
ANION GAP: 10 (ref 5–15)
BUN: 28 mg/dL — ABNORMAL HIGH (ref 8–23)
CO2: 25 mmol/L (ref 22–32)
Calcium: 9.8 mg/dL (ref 8.9–10.3)
Chloride: 98 mmol/L (ref 98–111)
Creatinine, Ser: 1.13 mg/dL (ref 0.61–1.24)
GFR calc Af Amer: >60 mL/min (ref >60)
GFR calc non Af Amer: >60 mL/min (ref >60)
Glucose, Bld: 133 mg/dL — ABNORMAL HIGH (ref 70–99)
Potassium: 3.8 mmol/L (ref 3.5–5.1)
Sodium: 133 mmol/L — ABNORMAL LOW (ref 135–145)

## 2018-07-19 LAB — GLUCOSE, CAPILLARY
Glucose-Capillary: 126 mg/dL — ABNORMAL HIGH (ref 70–99)
Glucose-Capillary: 135 mg/dL — ABNORMAL HIGH (ref 70–99)
Glucose-Capillary: 124 mg/dL — ABNORMAL HIGH (ref 70–99)

## 2018-07-19 NOTE — Progress Notes (Signed)
Patient restless throughout the night. Up and down out of bed several times. Attempting to get out of bed unassisted. Mostly cooperative with direction of staff, slightly irritated at times. Slept for 6 hours, however not consistent.No prns given. Voided x2 this shift, required straight cath x1, last bladder scan and void at 0345-188 post void.

## 2018-07-19 NOTE — Progress Notes (Addendum)
Occupational Therapy Session Note  Patient Details  Name: William [name] MRN: [id number] Date of Birth: [birth year]  Today's Date: 07/19/2018 OT Individual Time: 1300-1400 Session 2: [id number] OT Individual Time Calculation (min): 60 min Session 2: 18 min  OT Missed Time: 12 min missed d/t pt eating lunch    Short Term Goals: Week 2:  OT Short Term Goal 1 (Week 2): Continue working on established LTGs set at supervision level overall.   Skilled Therapeutic Interventions/Progress Updates:    Session focused on bathing/dressing at shower level with adherence to back precautions and core stabilization. Pt completed functional mobility in room with RW to bathroom with CGA. Pt required cueing for donning brace, UE placement, and RW management. Pt required min A to doff socks and pants seated on TTB in walk in shower. Pt completed UB bathing with (S). Cueing required to use LH sponge to wash distally. Pt completed standing level peri hygiene with 1 slight LOB posteriorly but able to self correct. Pt donned underwear/shorts with moderate cueing for use of reacher and not to bend forward and CGA in standing for balance support. Pt was then transported to therapy gym where he completed core stabilization exercises using dynamic catching/throwing task with significant posterior lean. Moderate vc throughout to correct. Pt returned to room and left supine in bed with bed alarm set and all needs met.    Session 2: Pt received supine in bed with no c/o pain. Session focused on functional mobility, B UE coordination, and standing balance. Pt completed 130 ft of functional mobility with RW with moderate cueing for RW management and CGA overall. Pt stood and completed B UE coordination task, dynamically throwing/catching ball with good accuracy, several posterior LOB with min A to trigger righting reaction. Pt returned to room in similar fashion as above and returned to supine in bed with all needs met. Daughter  present, bed alarm set.    Therapy Documentation Precautions:  Precautions Precautions: Back, Fall Required Braces or Orthoses: Spinal Brace Spinal Brace: Applied in sitting position Restrictions Weight Bearing Restrictions: No Pain: Pain Assessment Pain Scale: 0-10 Pain Score: 4  Pain Type: Acute pain Pain Location: Foot Pain Orientation: Right Pain Descriptors / Indicators: Aching Pain Onset: On-going Pain Intervention(s): Rest  Therapy/Group: Individual Therapy  Curtis Sites 07/19/2018, 3:07 PM

## 2018-07-19 NOTE — Progress Notes (Signed)
[facility] PHYSICAL MEDICINE & REHABILITATION PROGRESS NOTE  Subjective/Complaints: Patient seen laying in bed this morning.  No sleep chart.  Discussed with nursing, patient slept better overnight.  ROS: Denies CP, shortness of breath, nausea, vomiting, diarrhea.  Objective: Vital Signs: Blood pressure 126/74, pulse 76, temperature 98.6 F (37 C), temperature source Oral, resp. rate 20, height 5\' 11"  (1.803 m), weight 91.9 kg, SpO2 98 %. No results found. No results for input(s): WBC, HGB, HCT, PLT in the last 72 hours. Recent Labs    07/19/18 0600  NA 133*  K 3.8  CL 98  CO2 25  GLUCOSE 133*  BUN 28*  CREATININE 1.13  CALCIUM 9.8    Physical Exam: BP 126/74 (BP Location: Left Arm)   Pulse 76   Temp 98.6 F (37 C) (Oral)   Resp 20   Ht 5\' 11"  (1.803 m)   Wt 91.9 kg   SpO2 98%   BMI 28.26 kg/m  Constitutional: No distress . Vital signs reviewed. HENT: Normocephalic.  Atraumatic. Eyes: EOMI. No discharge. Cardiovascular: RRR.  No JVD. Respiratory: CTA bilaterally.  Normal effort. GI: BS +. Non-distended. Musc: No edema or tenderness in extremities. Neuro: Alert and oriented, except for date of month. Follows commands. Motor: Grossly 4+/5 throughout, stable Skin: Back with Steri-Strips C/D/I, not examined today Psych: Normal mood.  Normal behavior.  Assessment/Plan: 1. Functional deficits secondary to encephalopathy status post epidural hematoma with laminectomy which require 3+ hours per day of interdisciplinary therapy in a comprehensive inpatient rehab setting.  Physiatrist is providing close team supervision and 24 hour management of active medical problems listed below.  Physiatrist and rehab team continue to assess barriers to discharge/monitor patient progress toward functional and medical goals  Care Tool:  Bathing    Body parts bathed by patient: Right arm, Left arm, Chest, Abdomen, Front perineal area, Buttocks, Right upper leg, Left upper leg,  Right lower leg, Left lower leg, Face   Body parts bathed by helper: Chest, Front perineal area, Buttocks, Left lower leg, Right lower leg Body parts n/a: Right lower leg, Left lower leg   Bathing assist Assist Level: Minimal Assistance - Patient > 75%     Upper Body Dressing/Undressing Upper body dressing   What is the patient wearing?: Pull over shirt, Orthosis    Upper body assist Assist Level: Minimal Assistance - Patient > 75%    Lower Body Dressing/Undressing Lower body dressing      What is the patient wearing?: Incontinence brief, Pants     Lower body assist Assist for lower body dressing: Moderate Assistance - Patient 50 - 74%     Toileting Toileting    Toileting assist Assist for toileting: Moderate Assistance - Patient 50 - 74%     Transfers Chair/bed transfer  Transfers assist     Chair/bed transfer assist level: Contact Guard/Touching assist     Locomotion Ambulation   Ambulation assist      Assist level: Contact Guard/Touching assist Assistive device: Walker-rolling Max distance: 75'   Walk 10 feet activity   Assist  Walk 10 feet activity did not occur: Safety/medical concerns  Assist level: Contact Guard/Touching assist Assistive device: Walker-rolling   Walk 50 feet activity   Assist Walk 50 feet with 2 turns activity did not occur: Safety/medical concerns  Assist level: Contact Guard/Touching assist Assistive device: Walker-rolling    Walk 150 feet activity   Assist Walk 150 feet activity did not occur: Safety/medical concerns    Assistive device: Walker-rolling  Walk 10 feet on uneven surface  activity   Assist Walk 10 feet on uneven surfaces activity did not occur: Safety/medical concerns         Wheelchair     Assist   Type of Wheelchair: Manual    Wheelchair assist level: Supervision/Verbal cueing Max wheelchair distance: 30'    Wheelchair 50 feet with 2 turns activity    Assist         Assist Level: Supervision/Verbal cueing   Wheelchair 150 feet activity     Assist Wheelchair 150 feet activity did not occur: Safety/medical concerns(100 ft max per PT note)   Assist Level: Supervision/Verbal cueing      Medical Problem List and Plan: 1. 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 DVT Prophylaxis/Anticoagulation: Mechanical:Sequential compression devices, below kneeBilateral lower extremities 3. Pain Management:Tylenol as needed for now. Local measures with heat and/or ice.  4. Mood:LCSW to follow for evaluation and support. 5. Neuropsych: This patientis not fullycapable of making decisions onhisown behalf. 6. Skin/Wound Care:Monitor wound for healing and for any recurrent drainage. Monitor for signs of infection. 7. Fluids/Electrolytes/Nutrition:Monitor I's and O's. Offer nutritional supplements as with variable p.o. intake  BMP within acceptable range on 2/21, labs pending 8. Delirium: Continue to monitor.  Discontinued Norco and flexeril  Acute lower UTI  UA negative, urine culture greater than 100,000 Klebsiella.   Keflex started on 2/24 x7 days-through today  Monitor for other signs of infection.  9.HTN: Monitor blood pressures twice daily. Continue Cozaar, Toprol-XL and amlodipine  Controlled on 3/2  Monitor with increased mobility 10.History of bladder cancer/BPH:Recent episode of urinary retention. Monitor voiding with cath for volumes greater than 350 cc.  Flomax started 11.Epidural hematoma/ABLA: Monitor for signs of bleeding  Hemoglobin 12.3 on 2/27 12.GERD: Continue Protonix 13.  Diabetes mellitus type 2  Hemoglobin A1c 7.5  Metformin 250 daily started on 2/25 CBG (last 3)  Recent Labs    07/18/18 1729  07/18/18 2108 07/19/18 0700  GLUCAP 172* 125* 126*   Relatively controlled on 3/2 14.  Hypoalbuminemia  Supplement initiated on 2/21 15.  Transaminitis  LFTs elevated, but improving on 2/25  Avoid hepatotoxic meds 16.  Hyponatremia  Sodium 133 on 3/2  Continue to monitor 17. Sundowning  Sleep chart ordered  Trazodone DC'd on 2/27, Restoril started on 2/27- not helpful  Seroquel 25mg  not very helpful, increased to 50mg  3/1    LOS: 11 days A FACE TO FACE EVALUATION WAS PERFORMED  Ankit Lorie Phenix 07/19/2018, 8:54 AM

## 2018-07-19 NOTE — Plan of Care (Signed)
  Problem: Consults Goal: Skin Care Protocol Initiated - if Braden Score 18 or less Description If consults are not indicated, leave blank or document N/A Outcome: Progressing Goal: RH GENERAL PATIENT EDUCATION Description See Patient Education module for education specifics. Outcome: Progressing   Problem: RH BOWEL ELIMINATION Goal: RH STG MANAGE BOWEL WITH ASSISTANCE Description STG Manage Bowel with Min. Assistance.  Outcome: Progressing Goal: RH STG MANAGE BOWEL W/MEDICATION W/ASSISTANCE Description STG Manage Bowel with Medication with Min.Assistance.  Outcome: Progressing   Problem: RH BLADDER ELIMINATION Goal: RH STG MANAGE BLADDER WITH ASSISTANCE Description STG Manage Bladder With Mod.Assistance  Outcome: Progressing   Problem: RH SKIN INTEGRITY Goal: RH STG SKIN FREE OF INFECTION/BREAKDOWN Description With min. Assist.  Outcome: Progressing Goal: RH STG MAINTAIN SKIN INTEGRITY WITH ASSISTANCE Description STG Maintain Skin Integrity With min.Assistance.  Outcome: Progressing   Problem: RH SAFETY Goal: RH STG ADHERE TO SAFETY PRECAUTIONS W/ASSISTANCE/DEVICE Description STG Adhere to Safety Precautions With mod. Assistance/Device.  Outcome: Progressing   Problem: RH PAIN MANAGEMENT Goal: RH STG PAIN MANAGED AT OR BELOW PT'S PAIN GOAL Description Less than 3  Outcome: Progressing   Problem: RH KNOWLEDGE DEFICIT GENERAL Goal: RH STG INCREASE KNOWLEDGE OF SELF CARE AFTER HOSPITALIZATION Description Pt. Will be able to verbalized the safety precautions to prevent complication with his back surgery.  Outcome: Progressing

## 2018-07-19 NOTE — Plan of Care (Signed)
  Problem: Consults Goal: RH GENERAL PATIENT EDUCATION Description See Patient Education module for education specifics. Outcome: Progressing   Problem: RH BOWEL ELIMINATION Goal: RH STG MANAGE BOWEL WITH ASSISTANCE Description STG Manage Bowel with Min. Assistance.  Outcome: Progressing Goal: RH STG MANAGE BOWEL W/MEDICATION W/ASSISTANCE Description STG Manage Bowel with Medication with Min.Assistance.  Outcome: Progressing   Problem: RH SKIN INTEGRITY Goal: RH STG SKIN FREE OF INFECTION/BREAKDOWN Description With min. Assist.  Outcome: Progressing Goal: RH STG MAINTAIN SKIN INTEGRITY WITH ASSISTANCE Description STG Maintain Skin Integrity With min.Assistance.  Outcome: Progressing

## 2018-07-19 NOTE — Progress Notes (Signed)
Physical Therapy Session Note  Patient Details  Name: William [name] MRN: [id number] Date of Birth: 1950/10/28  Today's Date: 07/19/2018 PT Individual Time: 1020-1122 PT Individual Time Calculation (min): 62 min   Short Term Goals: Week 2:  PT Short Term Goal 1 (Week 2): STG=LTG due to ELOS  Skilled Therapeutic Interventions/Progress Updates: Pt presented in bed agreeable to therapy. Pt c/io pain in R foot 5/10. Pt performed bed mobility from bed flat with use of bed rails CGA with verbal cues to maintain spinal precautions. Pt donned LSO with set up. Performed ambulatory transfer to w/c and pt transported to day room to performed Biodex LOS. Pt performed LOS static level with and without UE support. Pt noted to require increased cues for anterior and posterior wt shifting. Pt also noted to have decreased ankle strategy and would lead with shoulders but would be able to correct with tactile cues. Pt indicated increased pain in R foot with fatigue. Returned to w/c for seated rest with some resolution. Pt transported to rehab gym and participated in standing balance activities including toe taps without AD for RLE coordination and wt shifting. Pt initially required heavy cues for wt shifting as pt attempting to lean again PTA for support however was able to improve with multiple repetitions. Pt also ambulated 138f with RW and CGA  And noted decreased R foot clearance with fatigue and with verbal cues to decrease twisting and staying within RW when performing turns. Pt returned to w/c and propelled back to room 525fwith mod verbal cues for technique and PTA transporting remaining distance back to room. Pt agreeable to try recliner vs w/c to increase sitting tolerance. Pt performed ambulatory transfer to reliner with pt able to clear [employer] power cord safely with RW and verbal cues. Pt left in recliner at end of session with belt alarm on, call bell within reach and needs met.      Therapy  Documentation Precautions:  Precautions Precautions: Back, Fall Required Braces or Orthoses: Spinal Brace Spinal Brace: Applied in sitting position Restrictions Weight Bearing Restrictions: No General:   Vital Signs:   Pain:   Mobility:   Locomotion :    Trunk/Postural Assessment :    Balance:   Exercises:   Other Treatments:      Therapy/Group: Individual Therapy  Martha Soltys 07/19/2018, 12:56 PM

## 2018-07-19 NOTE — Progress Notes (Signed)
Speech Language Pathology Daily Session Note  Patient Details  Name: William [name] MRN: [id number] Date of Birth: 04-24-1951  Today's Date: 07/19/2018 SLP Individual Time: 0830-0930 SLP Individual Time Calculation (min): 60 min  Short Term Goals: Week 2: SLP Short Term Goal 1 (Week 2): STG=LTG due to reamining length of stay  Skilled Therapeutic Interventions:  Skilled treatment focused on cognition goals. SLP facilitated session by providing Mod A cues to play card game in simplest form. Specifically pt required cues for recall of ways to match cards, sequence of placing cards on table and he continued to demonstrate decreased awareness of deficits. SLP instruction was required to provide structure to task. Additionally, pt required Min A cues to redirect to task. {Pt left upright in wheelchair at the nursing station.      Pain Pain Assessment Pain Scale: 0-10 Pain Score: 0-No pain  Therapy/Group: Individual Therapy  William [name] 07/19/2018, 1:35 PM

## 2018-07-20 ENCOUNTER — Inpatient Hospital Stay (HOSPITAL_COMMUNITY): Payer: Federal, State, Local not specified - PPO | Admitting: Speech Pathology

## 2018-07-20 ENCOUNTER — Inpatient Hospital Stay (HOSPITAL_COMMUNITY): Payer: Federal, State, Local not specified - PPO

## 2018-07-20 ENCOUNTER — Inpatient Hospital Stay (HOSPITAL_COMMUNITY): Payer: Federal, State, Local not specified - PPO | Admitting: *Deleted

## 2018-07-20 ENCOUNTER — Inpatient Hospital Stay (HOSPITAL_COMMUNITY): Payer: Federal, State, Local not specified - PPO | Admitting: Physical Therapy

## 2018-07-20 DIAGNOSIS — K5901 Slow transit constipation: Secondary | ICD-10-CM

## 2018-07-20 LAB — GLUCOSE, CAPILLARY
Glucose-Capillary: 118 mg/dL — ABNORMAL HIGH (ref 70–99)
Glucose-Capillary: 77 mg/dL (ref 70–99)
Glucose-Capillary: 148 mg/dL — ABNORMAL HIGH (ref 70–99)
Glucose-Capillary: 122 mg/dL — ABNORMAL HIGH (ref 70–99)

## 2018-07-20 MED ORDER — POLYETHYLENE GLYCOL 3350 17 G PO PACK
17.0000 g | PACK | Freq: Every day | ORAL | Status: DC
  Administered 2018-07-22 – 2018-07-23 (×2): 17 g via ORAL
  Filled 2018-07-20 (×3): qty 1

## 2018-07-20 NOTE — Progress Notes (Signed)
Occupational Therapy Session Note  Patient Details  Name: William [name] MRN: [id number] Date of Birth: [birth year]  Today's Date: 07/20/2018 OT Individual Time: 1700-1749 OT Individual Time Calculation (min): 75 min    Short Term Goals: Week 2:  OT Short Term Goal 1 (Week 2): Continue working on established LTGs set at supervision level overall.   Skilled Therapeutic Interventions/Progress Updates:    Pt received supine in bed with c/o pain as described below. Pt completed functional mobility with RW into bathroom with CGA. Intermittent cueing for RW management. Moderate cueing for doffing clothes seated instead of standing. Cueing required for adherence to back precautions when washing distally. Once cued pt able to use LH sponge correctly. Pt able to stand with use of grab bars anteriorly with no overt LOB. Pt still demonstrating impulsive tendencies, standing without alerting OT. Cueing required for use of AD during LB dressing. Pt required seated rest break following shower. Posterior lean still occurring during sitting EOB, pt able to self correct. Cueing required to use sock aid sitting EOB. Fatigue worsening with posterior lean. Pt able to don socks using sock aid with min A. Pt completed ~10 ft of functional mobility before requesting rest break and R knee buckling. Pt was transported to therapy gym for time management. Pt completed SPT to therapy mat with CGA. Pt completed core stabilization activity sitting EOM with more than usual levels of fatigue requiring frequent rest breaks. Focus on neutral pelvic alignment to promote maximal core activation. Pt completed B UE strengthening circuit with non-weighted ball, cueing for proper muscle activation. Pt returned to room via w/c and was left supine with all needs met, bed alarm set.   Therapy Documentation Precautions:  Precautions Precautions: Back, Fall Required Braces or Orthoses: Spinal Brace Spinal Brace: Applied in sitting  position Restrictions Weight Bearing Restrictions: (P) No Pain: Pain Assessment Pain Scale: 0-10 Pain Score: 4  Pain Type: Surgical pain Pain Location: Back Pain Orientation: Lower Pain Descriptors / Indicators: Aching Pain Onset: On-going Pain Intervention(s): Rest;Shower   Therapy/Group: Individual Therapy  Curtis Sites 07/20/2018, 7:58 AM

## 2018-07-20 NOTE — Progress Notes (Signed)
Physical Therapy Session Note  Patient Details  Name: William [name] MRN: [id number] Date of Birth: 1951-01-02  Today's Date: 07/20/2018 PT Individual Time: 1005-1105 PT Individual Time Calculation (min): 60 min   Short Term Goals: Week 2:  PT Short Term Goal 1 (Week 2): STG=LTG due to ELOS  Skilled Therapeutic Interventions/Progress Updates: Pt presented in bed sleeping but easily aroused and agreeable to therapy. Pt stating increased back pain earlier however now improved with rest and use of ice pack. Pt performed supine to sit at EOB with bed flat with supervision while maintaining spinal precautions. Pt donned LSO with set up. Performed ambulatory transfer to w/c with verbal cues to decrease increased forward flexion while performing STS. Pt transported to day room and participated in NuStep L3 x 8 minutes for global conditioning. Pt then ambulated around day room with RW and CGA as pt requesting to look out window. Pt was able to safely negotiate computers and other obstacles with minimal cues. Pt then transported to rehab gym and performed HS stretching on block 1 minute x 3 bilaterally. Provided pt edu on hamstring/calf streching after back surgery. Pt then performed standing balance on Airex initially with RW then without AD. Pt noted to be able to self correct posterior lean when swaying however required assistance to correct with gentle perturbations.  Pt noted to have x 1 episode of R knee buckling when performing standing activity with pt able to self correct. Pt then performed forward and lateral step ups x 8 bilaterally with UE support for strengthening. Pt also ascended/descended x 8 steps (3in) with CGA for strengthening. After brief rest pt attempted ambulation back to room, pt was able to ambulate approx 30f and additional 317fthen unable to complete due to fatigue and increased pain in R foot. During ambulation pt required cues for erect posture, increasing step length, improving  heel strike and staying within RW. Pt remained in w/c at end of session with belt alarm on, call bell within reach and needs met.      Therapy Documentation Precautions:  Precautions Precautions: Back, Fall Required Braces or Orthoses: Spinal Brace Spinal Brace: Applied in sitting position Restrictions Weight Bearing Restrictions: No General:   Vital Signs: Therapy Vitals Temp: 97.8 F (36.6 C) Temp Source: Oral Pulse Rate: 93 Resp: 16 BP: 106/64 Patient Position (if appropriate): Lying Oxygen Therapy SpO2: 95 % O2 Device: Room Air Pain: Pain Assessment Pain Score: 0-No pain   Therapy/Group: Individual Therapy  [place]  Calvyn Kurtzman, PTA  07/20/2018, 4:56 PM

## 2018-07-20 NOTE — Progress Notes (Signed)
Recreational Therapy Assessment and Plan  Patient Details  Name: William [name] MRN: [id number] Date of Birth: 04-11-1951 Today's Date: 07/20/2018  Rehab Potential: Good  ELOS:   discharge 3/6  Assessment  Problem List:      Patient Active Problem List   Diagnosis Date Noted  . Transaminitis   . Hypoalbuminemia due to protein-calorie malnutrition ([place])   . Hyperglycemia   . Acute delirium   . Urinary incontinence due to benign prostatic hyperplasia 07/08/2018  . Spinal stenosis of lumbar region with radiculopathy 07/08/2018  . Epidural hematoma ([place]) 07/05/2018  . Acute urinary retention 07/04/2018  . Lumbar stenosis with neurogenic claudication 02/24/2018  . S/P total knee replacement 12/08/2016    Past Medical History:      Past Medical History:  Diagnosis Date  . Arthritis    OA- knees   . Bell's palsy 2014  . Bladder cancer ([place])   . BPH (benign prostatic hyperplasia)   . GERD (gastroesophageal reflux disease)   . Gout   . Hypertension   . Sleep apnea    CPAP-in use q night, last study 5 yrs. ago  . Spinal stenosis of lumbar region with radiculopathy    Past Surgical History:       Past Surgical History:  Procedure Laterality Date  . BLADDER TUMOR EXCISION  2017   several cystocscopy for the excision followed by M. Annitta Needs  . BLEPHAROPLASTY Bilateral 2017   in [place]  . CYSTOSCOPY    . HEMATOMA EVACUATION N/A 07/04/2018   Procedure: Lumbar wound exploration and EVACUATION OF EPIDURAL HEMATOMA;  Surgeon: Newman Pies, MD;  Location: [facility];  Service: Neurosurgery;  Laterality: N/A;  . JOINT REPLACEMENT    . KNEE ARTHROSCOPY     2 on each side, one being a repair of the meniscus     . LUMBAR LAMINECTOMY/DECOMPRESSION MICRODISCECTOMY N/A 02/24/2018   Procedure: Lumbar Four-Five Laminectomy/Foraminotomy;  Surgeon: Ashok Pall, MD;  Location: [facility];  Service: Neurosurgery;  Laterality: N/A;  . SHOULDER ARTHROSCOPY  Right 2016  . TONSILLECTOMY    . TOTAL KNEE ARTHROPLASTY Right 12/08/2016   Procedure: TOTAL KNEE ARTHROPLASTY;  Surgeon: Vickey Huger, MD;  Location: [facility];  Service: Orthopedics;  Laterality: Right;    Assessment & Plan Clinical Impression: Patient is a 68 year old male with history of HTN, bladder cancer, BPH, lumbar stenosis with radiculopathy s/p decompression 02/2018 and recent surgery few days PTA to [facility] on 07/05/18 with multiple falls, fever, blood drainage from the wound and urinary retention. He was treated with IV antibiotics in ED and transferred to [facility] for treatment. NS felt thatXraysnot neededfor work upas patient likely with hematoma leading to symptoms.He was taken to OR for I &D with evacuation of epidural hematoma by Dr. Arnoldo Morale. Wound cultures with rare WBC and no growth so far. He had issues with lethargy and confusion post op felt to be due to oxycodone therefore this was discontinued with improvement in mentation Patient transferred to CIR on 07/08/2018.    Pt presents with decreased activity tolerance, decreased functional mobility, decreased balance, decreased attention, decreased awareness, decreased problem solving, decreased safety awareness, decreased memory Limiting pt's independence with leisure/community pursuits.  Met with pt toady to discuss leisure interests, discharge planning, activity analysis and community reintegration.  Pt was friendly but not interested in TR services and information.  Pt focused on going home and having "freedom."   Plan No further TR as pt has no interest  Recommendations  for other services: None   Discharge Criteria: Patient will be discharged from TR if patient refuses treatment 3 consecutive times without medical reason.  If treatment goals not met, if there is a change in medical status, if patient makes no progress towards goals or if patient is discharged from hospital.  The above assessment, treatment  plan, treatment alternatives and goals were discussed and mutually agreed upon: by patient  [place] 07/20/2018, 2:50 PM

## 2018-07-20 NOTE — Progress Notes (Addendum)
William [name] PHYSICAL MEDICINE & REHABILITATION PROGRESS NOTE  Subjective/Complaints: Patient seen sitting up at edge of his bed this morning.  He states he slept well overnight, confirmed with sleep chart.  Discussed constipation with nursing.  ROS: Denies CP, shortness of breath, nausea, vomiting, diarrhea.  Objective: Vital Signs: Blood pressure 128/68, pulse 90, temperature 98.4 F (36.9 C), temperature source Oral, resp. rate 18, height 5\' 11"  (1.803 m), weight 91.9 kg, SpO2 95 %. No results found. No results for input(s): WBC, HGB, HCT, PLT in the last 72 hours. Recent Labs    07/19/18 0600  NA 133*  K 3.8  CL 98  CO2 25  GLUCOSE 133*  BUN 28*  CREATININE 1.13  CALCIUM 9.8    Physical Exam: BP 128/68 (BP Location: Left Arm)   Pulse 90   Temp 98.4 F (36.9 C) (Oral)   Resp 18   Ht 5\' 11"  (1.803 m)   Wt 91.9 kg   SpO2 95%   BMI 28.26 kg/m  Constitutional: No distress . Vital signs reviewed. HENT: Normocephalic.  Atraumatic. Eyes: EOMI. No discharge. Cardiovascular: RRR.  No JVD. Respiratory: CTA bilaterally.  Normal effort. GI: BS +. Non-distended. Musc: No edema or tenderness in extremities. Neuro: Alert and oriented x2. Follows commands. Motor: Grossly 4+/5 throughout, unchanged Skin: Back with Steri-Strips C/D/I, not examined today Psych: Normal mood.  Normal behavior.  Assessment/Plan: 1. Functional deficits secondary to encephalopathy status post epidural hematoma with laminectomy which require 3+ hours per day of interdisciplinary therapy in a comprehensive inpatient rehab setting.  Physiatrist is providing close team supervision and 24 hour management of active medical problems listed below.  Physiatrist and rehab team continue to assess barriers to discharge/monitor patient progress toward functional and medical goals  Care Tool:  Bathing    Body parts bathed by patient: Right arm, Left arm, Chest, Abdomen, Front perineal area, Buttocks, Right  upper leg, Left upper leg, Right lower leg, Left lower leg, Face   Body parts bathed by helper: Chest, Front perineal area, Buttocks, Left lower leg, Right lower leg Body parts n/a: Right lower leg, Left lower leg   Bathing assist Assist Level: Contact Guard/Touching assist     Upper Body Dressing/Undressing Upper body dressing   What is the patient wearing?: Pull over shirt, Orthosis    Upper body assist Assist Level: Contact Guard/Touching assist    Lower Body Dressing/Undressing Lower body dressing      What is the patient wearing?: Incontinence brief, Pants     Lower body assist Assist for lower body dressing: Minimal Assistance - Patient > 75%     Toileting Toileting    Toileting assist Assist for toileting: Moderate Assistance - Patient 50 - 74%     Transfers Chair/bed transfer  Transfers assist     Chair/bed transfer assist level: Contact Guard/Touching assist     Locomotion Ambulation   Ambulation assist      Assist level: Contact Guard/Touching assist Assistive device: Walker-rolling Max distance: 75'   Walk 10 feet activity   Assist  Walk 10 feet activity did not occur: Safety/medical concerns  Assist level: Contact Guard/Touching assist Assistive device: Walker-rolling   Walk 50 feet activity   Assist Walk 50 feet with 2 turns activity did not occur: Safety/medical concerns  Assist level: Contact Guard/Touching assist Assistive device: Walker-rolling    Walk 150 feet activity   Assist Walk 150 feet activity did not occur: Safety/medical concerns    Assistive device: Walker-rolling  Walk 10 feet on uneven surface  activity   Assist Walk 10 feet on uneven surfaces activity did not occur: Safety/medical concerns         Wheelchair     Assist   Type of Wheelchair: Manual    Wheelchair assist level: Supervision/Verbal cueing Max wheelchair distance: 30'    Wheelchair 50 feet with 2 turns  activity    Assist        Assist Level: Supervision/Verbal cueing   Wheelchair 150 feet activity     Assist Wheelchair 150 feet activity did not occur: Safety/medical concerns(100 ft max per PT note)   Assist Level: Supervision/Verbal cueing      Medical Problem List and Plan: 1. 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 DVT Prophylaxis/Anticoagulation: Mechanical:Sequential compression devices, below kneeBilateral lower extremities 3. Pain Management:Tylenol as needed for now. Local measures with heat and/or ice.  4. Mood:LCSW to follow for evaluation and support. 5. Neuropsych: This patientis not fullycapable of making decisions onhisown behalf. 6. Skin/Wound Care:Monitor wound for healing and for any recurrent drainage. Monitor for signs of infection. 7. Fluids/Electrolytes/Nutrition:Monitor I's and O's. Offer nutritional supplements as with variable p.o. intake  BMP within acceptable range on 2/21, labs pending 8. Delirium: Continue to monitor.  Discontinued Norco and flexeril  Acute lower UTI  UA negative, urine culture greater than 100,000 Klebsiella. Keflex course completed on 3/3  Repeat urine culture ordered  Monitor for other signs of infection.  9.HTN: Monitor blood pressures twice daily. Continue Cozaar, Toprol-XL and amlodipine  Controlled on 3/3  Monitor with increased mobility 10.History of bladder cancer/BPH:Recent episode of urinary retention. Monitor voiding with cath for volumes greater than 350 cc.  Flomax started 11.Epidural hematoma/ABLA: Monitor for signs of bleeding  Hemoglobin 12.3 on 2/27 12.GERD: Continue Protonix 13.  Diabetes mellitus type 2  Hemoglobin A1c 7.5  Metformin 250 daily started on 2/25 CBG (last 3)  Recent Labs     07/19/18 1210 07/19/18 2152 07/20/18 0630  GLUCAP 135* 124* 118*   Relatively controlled on 3/3 14.  Hypoalbuminemia  Supplement initiated on 2/21 15.  Transaminitis  LFTs elevated, but improving on 2/25  Avoid hepatotoxic meds 16.  Hyponatremia  Sodium 133 on 3/2  Labs ordered for tomorrow  Continue to monitor 17. Sundowning  Sleep chart ordered  Trazodone DC'd on 2/27, Restoril started on 2/27- not helpful  Seroquel 25mg  not very helpful, increased to 50mg  3/1  Improving  18.  Constipation  Bowel meds scheduled on 3/3  LOS: 12 days A FACE TO FACE EVALUATION WAS PERFORMED   Lorie Phenix 07/20/2018, 7:57 AM

## 2018-07-20 NOTE — Progress Notes (Signed)
Patient resting on home CPAP.  

## 2018-07-20 NOTE — Progress Notes (Signed)
Speech Language Pathology Daily Session Note  Patient Details  Name: William [name] MRN: [id number] Date of Birth: 07/13/1950  Today's Date: 07/20/2018 SLP Individual Time: 1300-1400 SLP Individual Time Calculation (min): 60 min  Short Term Goals: Week 2: SLP Short Term Goal 1 (Week 2): STG=LTG due to reamining length of stay  Skilled Therapeutic Interventions: Pt was seen for skilled ST targeting cognitive goals. When engaged in discussion about discharge with SLP, pt required Mod verbal cues/prompts to identify specific cognitive deficits and how they may translate to his functioning at home. SLP reviewed pt's progress with self-monitoring his attention in controlled environments and educated pt regarding compensatory memory strategies for short term memory deficits. Pt and SLP created a memory notebook, during which pt could recall he had a fall that resulted in his current hospitalization, but Max assist verbal cues for recall were provided to recall details of medical course and past surgeries related to current hospitalization. Max-Total assist also required for recall of therapy team member names, Min-Mod assist prompting provided for recall of today's therapy activities. Sign posted at entrance of pt's room, advising staff to encourage and assist use of memory notebook (blue folder left at computer station in room). SLP facilitated remainder of session with Min verbal/visual cues for recall during an N-2 working memory procedure card task. Pt was returned to room and left laying in bed with bed alarm set, call bell within reach and all needs met. Recommend continue per current plan of care.      Pain Pain Assessment Pain Score: 0-No pain  Therapy/Group: Individual Therapy   Jettie Booze, Student SLP   Jettie Booze 07/20/2018, 2:56 PM

## 2018-07-21 ENCOUNTER — Inpatient Hospital Stay (HOSPITAL_COMMUNITY): Payer: Federal, State, Local not specified - PPO | Admitting: Physical Therapy

## 2018-07-21 ENCOUNTER — Inpatient Hospital Stay (HOSPITAL_COMMUNITY): Payer: Federal, State, Local not specified - PPO | Admitting: Occupational Therapy

## 2018-07-21 ENCOUNTER — Inpatient Hospital Stay (HOSPITAL_COMMUNITY): Payer: Federal, State, Local not specified - PPO

## 2018-07-21 ENCOUNTER — Inpatient Hospital Stay (HOSPITAL_COMMUNITY): Payer: Federal, State, Local not specified - PPO | Admitting: Speech Pathology

## 2018-07-21 LAB — BASIC METABOLIC PANEL
Anion gap: 8 (ref 5–15)
BUN: 26 mg/dL — AB (ref 8–23)
CO2: 26 mmol/L (ref 22–32)
Calcium: 9.8 mg/dL (ref 8.9–10.3)
Chloride: 102 mmol/L (ref 98–111)
Creatinine, Ser: 1.07 mg/dL (ref 0.61–1.24)
GFR calc Af Amer: >60 mL/min (ref >60)
GFR calc non Af Amer: >60 mL/min (ref >60)
GLUCOSE: 128 mg/dL — AB (ref 70–99)
Potassium: 4 mmol/L (ref 3.5–5.1)
Sodium: 136 mmol/L (ref 135–145)

## 2018-07-21 LAB — GLUCOSE, CAPILLARY
Glucose-Capillary: 115 mg/dL — ABNORMAL HIGH (ref 70–99)
Glucose-Capillary: 127 mg/dL — ABNORMAL HIGH (ref 70–99)
Glucose-Capillary: 134 mg/dL — ABNORMAL HIGH (ref 70–99)
Glucose-Capillary: 141 mg/dL — ABNORMAL HIGH (ref 70–99)

## 2018-07-21 LAB — URINE CULTURE: Culture: NO GROWTH

## 2018-07-21 MED ORDER — ACETAMINOPHEN 325 MG PO TABS: 325.0000 mg | ORAL_TABLET | ORAL | Status: DC | PRN

## 2018-07-21 MED ORDER — POLYVINYL ALCOHOL 1.4 % OP SOLN: 2.0000 [drp] | Freq: Three times a day (TID) | OPHTHALMIC | 0 refills | Status: AC | PRN

## 2018-07-21 NOTE — Plan of Care (Signed)
  Problem: Consults Goal: Skin Care Protocol Initiated - if Braden Score 18 or less Description If consults are not indicated, leave blank or document N/A Outcome: Progressing Goal: RH GENERAL PATIENT EDUCATION Description See Patient Education module for education specifics. Outcome: Progressing   Problem: RH BOWEL ELIMINATION Goal: RH STG MANAGE BOWEL WITH ASSISTANCE Description STG Manage Bowel with Min. Assistance.  Outcome: Progressing Goal: RH STG MANAGE BOWEL W/MEDICATION W/ASSISTANCE Description STG Manage Bowel with Medication with Min.Assistance.  Outcome: Progressing   Problem: RH BLADDER ELIMINATION Goal: RH STG MANAGE BLADDER WITH ASSISTANCE Description STG Manage Bladder With Mod.Assistance  Outcome: Progressing   Problem: RH SKIN INTEGRITY Goal: RH STG SKIN FREE OF INFECTION/BREAKDOWN Description With min. Assist.  Outcome: Progressing Goal: RH STG MAINTAIN SKIN INTEGRITY WITH ASSISTANCE Description STG Maintain Skin Integrity With min.Assistance.  Outcome: Progressing   Problem: RH SAFETY Goal: RH STG ADHERE TO SAFETY PRECAUTIONS W/ASSISTANCE/DEVICE Description STG Adhere to Safety Precautions With mod. Assistance/Device.  Outcome: Progressing   Problem: RH PAIN MANAGEMENT Goal: RH STG PAIN MANAGED AT OR BELOW PT'S PAIN GOAL Description Less than 3  Outcome: Progressing   Problem: RH KNOWLEDGE DEFICIT GENERAL Goal: RH STG INCREASE KNOWLEDGE OF SELF CARE AFTER HOSPITALIZATION Description Pt. Will be able to verbalized the safety precautions to prevent complication with his back surgery.  Outcome: Progressing

## 2018-07-21 NOTE — Discharge Summary (Addendum)
Physician Discharge Summary  Patient ID: William [name] MRN: [id number] DOB/AGE: [birth year] 68 y.o.  Admit date: 07/08/2018 Discharge date: 07/23/2018  Discharge Diagnoses:  Principal Problem:   Epidural hematoma ([place]) Active Problems:   Lumbar stenosis with neurogenic claudication   Urinary incontinence due to benign prostatic hyperplasia   Transaminitis   Hypoalbuminemia due to protein-calorie malnutrition (HCC)   Hyponatremia   New onset type 2 diabetes mellitus ([name])   Sundowning   Diabetes mellitus type 2 in nonobese (HCC)   Slow transit constipation   Discharged Condition: stable   Significant Diagnostic Studies: Ct Head Wo Contrast  Result Date: 07/13/2018 CLINICAL DATA:  Headache after a fall. EXAM: CT HEAD WITHOUT CONTRAST TECHNIQUE: Contiguous axial images were obtained from the base of the skull through the vertex without intravenous contrast. COMPARISON:  06/11/2012 FINDINGS: Brain: Diffuse cerebral atrophy. Ventricular dilatation is likely due to central atrophy, although the degree of dilatation is out of proportion to the degree of sulcal atrophy and this could also represent normal pressure hydrocephalus in the appropriate clinical setting. No mass effect or midline shift. No abnormal extra-axial fluid collections. Gray-white matter junctions are distinct. Basal cisterns are not effaced. No acute intracranial hemorrhage. Vascular: Moderate intracranial arterial calcifications. Skull: Calvarium appears intact. No acute depressed skull fractures. Sinuses/Orbits: Paranasal sinuses and mastoid air cells are clear. Other: None. IMPRESSION: 1. No acute intracranial abnormalities. Chronic atrophy and small vessel ischemic changes. 2. Ventricular dilatation is somewhat out of proportion to the degree of sulcal atrophy. Possible normal pressure hydrocephalus versus prominent central atrophy. Electronically Signed   By: [name] M.D.   On: 07/13/2018 21:48    Labs:   Basic Metabolic Panel: Recent Labs  Lab 07/19/18 0600 07/21/18 0529 07/22/18 0555  NA 133* 136 134*  K 3.8 4.0 4.1  CL 98 102 99  CO2 25 26 27   GLUCOSE 133* 128* 134*  BUN 28* 26* 24*  CREATININE 1.13 1.07 1.10  CALCIUM 9.8 9.8 9.9    CBC: CBC Latest Ref Rng & Units 07/22/2018 07/15/2018 07/09/2018  WBC 4.0 - 10.5 K/uL 9.6 5.3 9.6  Hemoglobin 13.0 - 17.0 g/dL 13.0 12.3(L) 13.0  Hematocrit 39.0 - 52.0 % 38.9(L) 36.1(L) 37.3(L)  Platelets 150 - 400 K/uL 294 265 255    CBG: Recent Labs  Lab 07/21/18 1635 07/21/18 2115 07/22/18 0647 07/22/18 1206 07/22/18 1642  GLUCAP 134* 141* 117* 87 138*    Brief HPI:   William [name] is a 68 year old male with history of HTN, bladder cancer, BPH, lumbar stenosis with radiculopathy status post decompression 02/2018 and recent surgery few days prior to admission to [name] with multiple falls, fevers, urinary retention and bloody drainage from wound.  Marland Kitchen  He was treated with IV antibiotics in the ED and transferred to [name] for treatment on 07/04/18 t.  Dr. Arnoldo [name] felt that patient had hematoma leading to wound drainage and he was taken to the OR the same day for I&D with evacuation of epidural hematoma. Wound cultures have shown no growth with rare WBCs.  Patient continues to have issues with lethargy as well as confusion post surgery felt to be due to narcotics.  Mentation is improving however therapy evaluations revealed functional decline.  CIR was recommended for follow-up therapy   [name] Course: [name] was admitted to rehab 07/08/2018 for inpatient therapies to consist of PT, ST and OT at least three hours five days a week. Past admission physiatrist, therapy team and  rehab RN have worked together to provide customized collaborative inpatient rehab. Blood pressures were monitored on bid basis and has been controlled. Due to recent issues with urinary retention, voiding was monitored with PVR checks and  flomax added to help improve bladder function. He continued to have issue with confusion therefore  Norco and flexeril were discontinued. UA/UCS ordered for work up and showed Kleb UTI. He was treated with 7 day course of Keflex and follow up UCS showed no growth.  He continues to have sundowning as well as sleep wake disruption.  Seroquel was added with improvement in sleep cycle.   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  Neurosurgery was contacted for input and to evaluate CT--patient to follow-up with [name] after discharge.  Follow up labs showed abnormal LFTs and repeat labs showed improvement. Lytes have been monitored with serial check. Mild hyponatremia noted. Follow up CBC showed that ABLA has resolved. Hgb A1c checked due to hyperglycemia and he was found to have new onset DM with hgb A1c-7.5. Low dose metformin was started with improvement in BS. Back incision is C/D/I.  He has continued to make improvement in mobility as well as activity tolerance.  He continues cognitive impairments requiring supervision to min assist.  Family is unable to provide care and has elected on SNF for progressive therapies.  He was discharged to [place] course: During patient's stay in rehab weekly team conferences were held to monitor patient's progress, set goals and discuss barriers to discharge. At admission, patient required max assist with basic self care tasks and with mobility.  He exhibited moderate to severe cognitive deficits and needs max verbal cues to maintain attention to task. He required max assist with verbal/visual cues to complete basic tasks. He  has had improvement in activity tolerance, balance, postural control as well as ability to compensate for deficits. He has had improvement in functional use BLE as well as some improvement in safety awareness.  He is able to complete ADL  task with supervision and cues. He requires supervision for transfers and to ambulate 150' with RW.  He requires min to mod assist for recall of day to day events and needs min verbal/visual cues to use memory strategies.   Disposition:  Skilled Nursing Facility  Diet: Carb Modified medium  Special Instructions: 1. Continue routine back precautions.  2. Monitor blood sugars ac/hs and titrate metformin as indicated. 3.  Use CPAP at bedtime and whenever napping. 4. Encourage fluid intake. Recheck BMET in 5-7 days.   Discharge Instructions    Ambulatory referral to Physical Medicine Rehab   Complete by:  As directed    1-2 weeks transitional care appt     Allergies as of 07/23/2018      Reactions   Sulfa Antibiotics Other (See Comments)   UNSPECIFIED REACTION    Sulfasalazine Dermatitis   UNSPECIFIED SEVERITY REACTION       Medication List    STOP taking these medications   aspirin EC 81 MG tablet   CoQ10 100 MG Caps   diazepam 5 MG tablet Commonly known as:  VALIUM   GLUCOSAMINE PO   HYDROcodone-acetaminophen 7.5-325 MG tablet Commonly known as:  NORCO   meloxicam 15 MG tablet Commonly known as:  MOBIC     TAKE these medications   acetaminophen 325 MG tablet Commonly known as:  TYLENOL Take 1-2  tablets (325-650 mg total) by mouth every 4 (four) hours as needed for mild pain. What changed:    how much to take  reasons to take this   allopurinol 100 MG tablet Commonly known as:  ZYLOPRIM Take 100 mg by mouth daily.   amLODipine 5 MG tablet Commonly known as:  NORVASC Take 5 mg by mouth daily.   atorvastatin 10 MG tablet Commonly known as:  LIPITOR Take 10 mg by mouth daily.   Chelated Magnesium 100 MG Tabs Take 400 mg by mouth 2 (two) times daily.   docusate sodium 100 MG capsule Commonly known as:  COLACE Take 1 capsule (100 mg total) by mouth 2 (two) times daily.   Fish Oil 1200 MG Caps Take 2,400 mg by mouth 2 (two) times daily.   FLAXSEED  OIL PO Take 2,000 mg by mouth 2 (two) times daily.   loratadine 10 MG tablet Commonly known as:  CLARITIN Take 10 mg by mouth daily.   losartan-hydrochlorothiazide 100-12.5 MG tablet Commonly known as:  HYZAAR Take 1 tablet by mouth daily.   metFORMIN 500 MG tablet Commonly known as:  GLUCOPHAGE Take 0.5 tablets (250 mg total) by mouth daily with breakfast.   metoprolol succinate 100 MG 24 hr tablet Commonly known as:  TOPROL-XL Take 100 mg by mouth daily.   multivitamin with minerals Tabs tablet Take 1 tablet by mouth daily.   pantoprazole 40 MG tablet Commonly known as:  PROTONIX Take 1 tablet (40 mg total) by mouth daily before breakfast.   polyethylene glycol packet Commonly known as:  MIRALAX / GLYCOLAX Take 17 g by mouth daily.   polyvinyl alcohol 1.4 % ophthalmic solution Commonly known as:  LIQUIFILM TEARS Place 2 drops into both eyes 3 (three) times daily as needed (for dry eyes).   QUEtiapine 50 MG tablet Commonly known as:  SEROQUEL Take 1 tablet (50 mg total) by mouth at bedtime.   tamsulosin 0.4 MG Caps capsule Commonly known as:  FLOMAX Take 0.4 mg by mouth 2 (two) times daily.   temazepam 7.5 MG capsule Commonly known as:  RESTORIL Take 1 capsule (7.5 mg total) by mouth at bedtime.   Turmeric Curcumin 500 MG Caps Take 500 mg by mouth 2 (two) times daily.   vitamin C 500 MG tablet Commonly known as:  ASCORBIC ACID Take 500 mg by mouth daily.      Follow-up Information    Jamse Arn, MD Follow up.   Specialty:  Physical Medicine and Rehabilitation Why:  Office will call you with follow up appointment Contact information: [address] [place] [address] [phone]        Ashok Pall, MD. Call.   Specialty:  Neurosurgery Why:  for follow up appointment Contact information: [address] [place] [address] [phone]        Mateo Flow, MD Follow up on 07/28/2018.   Specialty:   Family Medicine Why:  @ 11:10 am ([name] follow up appt) Contact information: [place] [address] [phone]           Signed: Bary Leriche 07/23/2018, 10:40 AM  Patient seen and examined by me on day of discharge. Delice Lesch, MD, ABPMR

## 2018-07-21 NOTE — Progress Notes (Signed)
Speech Language Pathology Daily Session Note  Patient Details  Name: [name] MRN: [id number] Date of Birth: April 07, 1951  Today's Date: 07/21/2018 SLP Individual Time: 1030-1100 SLP Individual Time Calculation (min): 30 min  Short Term Goals: Week 2: SLP Short Term Goal 1 (Week 2): STG=LTG due to reamining length of stay  Skilled Therapeutic Interventions: Pt was seen for skilled ST focused on cognition. SLP facilitate session with Min A cues to recall events of the day and Mod assist for use of compensatory memory strategies/external aid. Even with use of memory notebook that includes basic description of pt's medical course of events, pt required Mod assist to express specific reason for this hospitalization. Pt shows good awareness that he has physical and cognitive deficits, however requires Min verbal cues and prompts to identify specific implications of deficits upon his return home. Pt is confident he will have 24/7 supervision and assistance upon discharge upon discharge; SLP emphasized importance of 24/7 supervision for his safety, as his judgement is still impaired. Pt was left in bed with bed alarm set and call bell within reach. Recommend continue per current plan of care.       Pain Pain Assessment Pain Scale: 0-10 Pain Score: 0-No pain  Therapy/Group: Individual Therapy   Jettie Booze, Student SLP   Jettie Booze 07/21/2018, 1:41 PM

## 2018-07-21 NOTE — Progress Notes (Addendum)
[facility] PHYSICAL MEDICINE & REHABILITATION PROGRESS NOTE  Subjective/Complaints: Patient seen laying in bed this morning.  He states he slept well overnight, confirmed with sleep chart.  ROS: Denies CP, shortness of breath, nausea, vomiting, diarrhea.  Objective: Vital Signs: Blood pressure (!) 154/67, pulse 96, temperature 97.7 F (36.5 C), temperature source Oral, resp. rate 18, height 5\' 11"  (1.803 m), weight 91.9 kg, SpO2 95 %. No results found. No results for input(s): WBC, HGB, HCT, PLT in the last 72 hours. Recent Labs    07/19/18 0600 07/21/18 0529  NA 133* 136  K 3.8 4.0  CL 98 102  CO2 25 26  GLUCOSE 133* 128*  BUN 28* 26*  CREATININE 1.13 1.07  CALCIUM 9.8 9.8    Physical Exam: BP (!) 154/67 (BP Location: Left Arm)   Pulse 96   Temp 97.7 F (36.5 C) (Oral)   Resp 18   Ht 5\' 11"  (1.803 m)   Wt 91.9 kg   SpO2 95%   BMI 28.26 kg/m  Constitutional: No distress . Vital signs reviewed. HENT: Normocephalic.  Atraumatic. Eyes: EOMI. No discharge. Cardiovascular: RRR.  No JVD. Respiratory: CTA bilaterally.  Normal effort. GI: BS +. Non-distended. Musc: No edema or tenderness in extremities. Neuro: Alert and oriented x2. Follows commands. Motor: Grossly 4+/5 throughout, stable Skin: Back with Steri-Strips C/D/I, not examined today Psych: Normal mood.  Normal behavior.  Assessment/Plan: 1. Functional deficits secondary to encephalopathy status post epidural hematoma with laminectomy which require 3+ hours per day of interdisciplinary therapy in a comprehensive inpatient rehab setting.  Physiatrist is providing close team supervision and 24 hour management of active medical problems listed below.  Physiatrist and rehab team continue to assess barriers to discharge/monitor patient progress toward functional and medical goals  Care Tool:  Bathing    Body parts bathed by patient: Right arm, Left arm, Chest, Abdomen, Front perineal area, Buttocks, Right  upper leg, Left upper leg, Right lower leg, Left lower leg, Face   Body parts bathed by helper: Chest, Front perineal area, Buttocks, Left lower leg, Right lower leg Body parts n/a: Right lower leg, Left lower leg   Bathing assist Assist Level: Contact Guard/Touching assist     Upper Body Dressing/Undressing Upper body dressing   What is the patient wearing?: Pull over shirt, Orthosis    Upper body assist Assist Level: Contact Guard/Touching assist    Lower Body Dressing/Undressing Lower body dressing      What is the patient wearing?: Pants     Lower body assist Assist for lower body dressing: Contact Guard/Touching assist     Toileting Toileting    Toileting assist Assist for toileting: Moderate Assistance - Patient 50 - 74%     Transfers Chair/bed transfer  Transfers assist     Chair/bed transfer assist level: Contact Guard/Touching assist     Locomotion Ambulation   Ambulation assist      Assist level: Contact Guard/Touching assist Assistive device: Walker-rolling Max distance: 75'   Walk 10 feet activity   Assist  Walk 10 feet activity did not occur: Safety/medical concerns  Assist level: Contact Guard/Touching assist Assistive device: Walker-rolling   Walk 50 feet activity   Assist Walk 50 feet with 2 turns activity did not occur: Safety/medical concerns  Assist level: Contact Guard/Touching assist Assistive device: Walker-rolling    Walk 150 feet activity   Assist Walk 150 feet activity did not occur: Safety/medical concerns    Assistive device: Walker-rolling    Walk 10  feet on uneven surface  activity   Assist Walk 10 feet on uneven surfaces activity did not occur: Safety/medical concerns         Wheelchair     Assist   Type of Wheelchair: Manual    Wheelchair assist level: Supervision/Verbal cueing Max wheelchair distance: 30'    Wheelchair 50 feet with 2 turns activity    Assist        Assist  Level: Supervision/Verbal cueing   Wheelchair 150 feet activity     Assist Wheelchair 150 feet activity did not occur: Safety/medical concerns(100 ft max per PT note)   Assist Level: Supervision/Verbal cueing      Medical Problem List and Plan: 1. Debility and cognitive deficits secondary to Epidural hematoma, post op lumbar laminectomy with encephalopathy  Continue CIR  Recent CT head reviewed, likely some degree of NPH, which may be contributing to symptoms.  Will consider repeat after discussion with team  Team conference today to discuss current and goals and coordination of care, home and environmental barriers, and discharge planning with nursing, case manager, and therapies.  2. DVT Prophylaxis/Anticoagulation: Mechanical:Sequential compression devices, below kneeBilateral lower extremities 3. Pain Management:Tylenol as needed for now. Local measures with heat and/or ice.  4. Mood:LCSW to follow for evaluation and support. 5. Neuropsych: This patientis not fullycapable of making decisions onhisown behalf. 6. Skin/Wound Care:Monitor wound for healing and for any recurrent drainage. Monitor for signs of infection. 7. Fluids/Electrolytes/Nutrition:Monitor I's and O's. Offer nutritional supplements as with variable p.o. intake  BMP within acceptable range on 3/4 8. Delirium: Continue to monitor.  Discontinued Norco and flexeril  Acute lower UTI  UA negative, urine culture greater than 100,000 Klebsiella. Keflex course completed on 3/3  Repeat urine culture ordered, no growth  Monitor for other signs of infection.  9.HTN: Monitor blood pressures twice daily. Continue Cozaar, Toprol-XL and amlodipine  Labile and?  Trending up on 3/4  Monitor with increased mobility 10.History of bladder cancer/BPH:Recent episode of urinary retention. Monitor voiding with cath for volumes greater than 350 cc.  Flomax started 11.Epidural hematoma/ABLA: Monitor for signs  of bleeding  Hemoglobin 12.3 on 2/27 12.GERD: Continue Protonix 13.  Diabetes mellitus type 2  Hemoglobin A1c 7.5  Metformin 250 daily started on 2/25 CBG (last 3)  Recent Labs    07/20/18 1650 07/20/18 2209 07/21/18 0624  GLUCAP 148* 122* 115*   Relatively controlled on 3/4 14.  Hypoalbuminemia  Supplement initiated on 2/21 15.  Transaminitis  LFTs elevated, but improving on 2/25  Avoid hepatotoxic meds 16.  Hyponatremia: Resolved  Sodium 136 on 3/4  Continue to monitor 17. Sundowning  Sleep chart ordered  Trazodone DC'd on 2/27, Restoril started on 2/27- not helpful  Seroquel 25mg  not very helpful, increased to 50mg  3/1  Improving  18.  Constipation  Bowel meds scheduled on 3/3  LOS: 13 days A FACE TO FACE EVALUATION WAS PERFORMED  Zonie Crutcher Lorie Phenix 07/21/2018, 8:00 AM

## 2018-07-21 NOTE — Progress Notes (Signed)
Physical Therapy Session Note  Patient Details  Name: [name] MRN: [id number] Date of Birth: [birth year]  Today's Date: 07/21/2018 PT Individual Time: 0915-1015 PT Individual Time Calculation (min): 60 min   Short Term Goals: Week 1:  PT Short Term Goal 1 (Week 1): Pt will sit EOB with supervision assist from PT PT Short Term Goal 1 - Progress (Week 1): Met PT Short Term Goal 2 (Week 1): PT will ambulate 3f with mod assist and LRAD  PT Short Term Goal 2 - Progress (Week 1): Met PT Short Term Goal 3 (Week 1): Pt will propell WC [id number] supevisoin assist  PT Short Term Goal 3 - Progress (Week 1): Met PT Short Term Goal 4 (Week 1): Pt will initiate stair management training  PT Short Term Goal 4 - Progress (Week 1): Met Week 2:  PT Short Term Goal 1 (Week 2): STG=LTG due to ELOS  Skilled Therapeutic Interventions/Progress Updates:   Pt received supine in bed and agreeable to PT. Supine>sit transfer with supervision assist and min cues for back precautions.   Pt able to don LSO with supevision assist and cues for placement and proper tightness.   [facility] assist and RW. Min cues to push from bed. And to remain in RW to improve safety. WC mobility with Superversion assist from PT x [id number] cues for use of LUE to turn R to avoid obstacles on the L.   Gait training with RW x [id number] supervision assist. Min cues for attention to task and improved lift RLE with fatigue. All sit<>stand transfers with RW and supervision assist throughout treatment.   Stair management up/down 1 step with RW x 2 to simulate access to home. Moderate cues for proper AD management, posture, and step to gait pattern with use of LLE to ascend and RLE to descend.   Dynamic standing balance with supervision assist to perform Wii bowling x 4 frames. Min cues for proper anterior weight shift and use of ankle strategy to prevent posterior LOB. Patient returned  to room and [facility] and all needs met.          Therapy Documentation Precautions:  Precautions Precautions: Back, Fall Required Braces or Orthoses: Spinal Brace Spinal Brace: Applied in sitting position Restrictions Weight Bearing Restrictions: No   Pain:   denies    Therapy/Group: Individual Therapy  AustiLorie Phenix2020, 10:18 AM

## 2018-07-21 NOTE — NC FL2 (Signed)
[place] LEVEL OF CARE SCREENING TOOL     IDENTIFICATION  Patient Name: William [name] Birthdate: 1951-04-19 Sex: male Admission Date (Current Location): 07/08/2018  [facility] and [place] Number:  Herbalist and Address:  [facility], [place] [address] [place], [place] [address]      Provider Number: [id number]  Attending Physician Name and Address:  Jamse Arn, MD  Relative Name and Phone Number:       Current Level of Care: Other (Comment)(Acute Inpatient Rehabilitation) Recommended Level of Care: [place] Prior Approval Number:    Date Approved/Denied:   PASRR Number: [id number]  Discharge Plan: SNF    Current Diagnoses: Patient Active Problem List   Diagnosis Date Noted  . Slow transit constipation   . Diabetes mellitus type 2 in nonobese (HCC)   . Labile blood glucose   . Sundowning   . Fall   . Hyponatremia   . New onset type 2 diabetes mellitus ([place])   . Acute lower UTI   . Labile blood pressure   . Transaminitis   . Hypoalbuminemia due to protein-calorie malnutrition ([place])   . Hyperglycemia   . Acute delirium   . Urinary incontinence due to benign prostatic hyperplasia 07/08/2018  . Spinal stenosis of lumbar region with radiculopathy 07/08/2018  . Epidural hematoma ([place]) 07/05/2018  . Acute urinary retention 07/04/2018  . Lumbar stenosis with neurogenic claudication 02/24/2018  . S/P total knee replacement 12/08/2016    Orientation RESPIRATION BLADDER Height & Weight     Self, Time, Situation, Place  Normal Continent Weight: 91.9 kg Height:  5\' 11"  (180.3 cm)  BEHAVIORAL SYMPTOMS/MOOD NEUROLOGICAL BOWEL NUTRITION STATUS      Continent    AMBULATORY STATUS COMMUNICATION OF NEEDS Skin   Supervision Verbally Surgical wounds                       Personal Care Assistance Level of Assistance  Bathing, Dressing Bathing Assistance: Limited assistance   Dressing  Assistance: Limited assistance     Functional Limitations Info             SPECIAL CARE FACTORS FREQUENCY  PT (By licensed PT), OT (By licensed OT)     PT Frequency: 5x/wk OT Frequency: 5x/wk            Contractures Contractures Info: Not present    Additional Factors Info  Code Status, Allergies Code Status Info: FULL Allergies Info: Sulfa drugs           Current Medications (07/21/2018):  This is the current hospital active medication list Current Facility-Administered Medications  Medication Dose Route Frequency Provider Last Rate Last Dose  . acetaminophen (TYLENOL) tablet 325-650 mg  325-650 mg Oral Q4H PRN Bary Leriche, PA-C   650 mg at 07/18/18 0433  . allopurinol (ZYLOPRIM) tablet 100 mg  100 mg Oral Daily Bary Leriche, PA-C   100 mg at 07/21/18 0800  . alum & mag hydroxide-simeth (MAALOX/MYLANTA) 200-200-20 MG/5ML suspension 30 mL  30 mL Oral Q4H PRN Love, Pamela S, PA-C      . amLODipine (NORVASC) tablet 5 mg  5 mg Oral Daily Love, Ivan Anchors, PA-C   5 mg at 07/21/18 0757  . atorvastatin (LIPITOR) tablet 10 mg  10 mg Oral q1800 Bary Leriche, PA-C   10 mg at 07/20/18 1815  . bisacodyl (DULCOLAX) suppository 10 mg  10 mg Rectal Daily PRN Love,  Ivan Anchors, PA-C   10 mg at 07/14/18 0327  . diphenhydrAMINE (BENADRYL) 12.5 MG/5ML elixir 12.5-25 mg  12.5-25 mg Oral Q6H PRN Love, Pamela S, PA-C      . docusate sodium (COLACE) capsule 100 mg  100 mg Oral BID Love, Pamela S, PA-C   100 mg at 07/21/18 0800  . feeding supplement (PRO-STAT SUGAR FREE 64) liquid 30 mL  30 mL Oral BID Jamse Arn, MD   30 mL at 07/21/18 0757  . losartan (COZAAR) tablet 100 mg  100 mg Oral Daily Bary Leriche, PA-C   100 mg at 07/21/18 0017   And  . hydrochlorothiazide (MICROZIDE) capsule 12.5 mg  12.5 mg Oral Daily Bary Leriche, PA-C   12.5 mg at 07/21/18 0757  . lidocaine (XYLOCAINE) 2 % jelly   Topical PRN Love, Pamela S, PA-C      . loratadine (CLARITIN) tablet 10 mg  10 mg Oral  Daily Bary Leriche, PA-C   10 mg at 07/21/18 0756  . magnesium oxide (MAG-OX) tablet 400 mg  400 mg Oral BID Bary Leriche, PA-C   400 mg at 07/21/18 0756  . menthol-cetylpyridinium (CEPACOL) lozenge 3 mg  1 lozenge Oral PRN Love, Pamela S, PA-C       Or  . phenol (CHLORASEPTIC) mouth spray 1 spray  1 spray Mouth/Throat PRN Love, Pamela S, PA-C      . metFORMIN (GLUCOPHAGE) tablet 250 mg  250 mg Oral Q breakfast Jamse Arn, MD   250 mg at 07/21/18 0756  . metoprolol succinate (TOPROL-XL) 24 hr tablet 100 mg  100 mg Oral Daily Bary Leriche, PA-C   100 mg at 07/21/18 0757  . pantoprazole (PROTONIX) EC tablet 40 mg  40 mg Oral QAC breakfast Bary Leriche, PA-C   40 mg at 07/21/18 0756  . polyethylene glycol (MIRALAX / GLYCOLAX) packet 17 g  17 g Oral Daily PRN Bary Leriche, PA-C   17 g at 07/20/18 2043  . polyethylene glycol (MIRALAX / GLYCOLAX) packet 17 g  17 g Oral Daily Patel, Domenick Bookbinder, MD      . polyvinyl alcohol (LIQUIFILM TEARS) 1.4 % ophthalmic solution 2 drop  2 drop Both Eyes TID PRN Love, Pamela S, PA-C      . prochlorperazine (COMPAZINE) tablet 5-10 mg  5-10 mg Oral Q6H PRN Love, Pamela S, PA-C       Or  . prochlorperazine (COMPAZINE) injection 5-10 mg  5-10 mg Intramuscular Q6H PRN Love, Pamela S, PA-C       Or  . prochlorperazine (COMPAZINE) suppository 12.5 mg  12.5 mg Rectal Q6H PRN Love, Pamela S, PA-C      . QUEtiapine (SEROQUEL) tablet 50 mg  50 mg Oral QHS Charlett Blake, MD   50 mg at 07/20/18 2220  . sodium phosphate (FLEET) 7-19 GM/118ML enema 1 enema  1 enema Rectal Once PRN Love, Pamela S, PA-C      . tamsulosin (FLOMAX) capsule 0.4 mg  0.4 mg Oral BID Love, Pamela S, PA-C   0.4 mg at 07/21/18 0756  . temazepam (RESTORIL) capsule 7.5 mg  7.5 mg Oral QHS Jamse Arn, MD   7.5 mg at 07/20/18 2220     Discharge Medications: Please see discharge summary for a list of discharge medications.  Relevant Imaging Results:  Relevant Lab  Results:   Additional Information SS# [id number]  Lennart Pall, LCSW

## 2018-07-21 NOTE — Progress Notes (Signed)
RT NOTE:  Pt has home CPAP @ bedside that he manages. 

## 2018-07-21 NOTE — Progress Notes (Signed)
Occupational Therapy Session Note  Patient Details  Name: William [name] MRN: [id number] Date of Birth: [birth year]  Today's Date: 07/21/2018 OT Individual Time: 1515-1630 OT Individual Time Calculation (min): 75 min    Short Term Goals: Week 2:  OT Short Term Goal 1 (Week 2): Continue working on established LTGs set at supervision level overall.   Skilled Therapeutic Interventions/Progress Updates:    Pt received supine in bed with no c/o pain agreeable to therapy. Pt required cueing to don brace but required no assist. Pt completed SPT bed > w/c with (S). Pt propelled w/c 100 ft with cueing provided for technique d/t L deviation occurring frequently. Pt was transported remainder of way outside. Pt propelled w/c over uneven surfaces with occasional cueing for positioning. Pt used RW to complete 50 ft of functional mobility, problem solving through RW management and navigating uneven surfaces with declines/inclines. Pt returned inside and completed Wii bowling game to challenge dynamic standing balance. Pt with 1 posterior LOB d/t L knee buckling. Pt completed toileting with (S) using regular low toilet. Pt assisted in completing memory notebook entry. Pt returned to supine in bed with all needs met, bed alarm set.   Therapy Documentation Precautions:  Precautions Precautions: Back, Fall Required Braces or Orthoses: Spinal Brace Spinal Brace: Applied in sitting position Restrictions Weight Bearing Restrictions: No Pain:     Therapy/Group: Individual Therapy  Curtis Sites 07/21/2018, 7:27 AM

## 2018-07-22 ENCOUNTER — Inpatient Hospital Stay (HOSPITAL_COMMUNITY): Payer: Federal, State, Local not specified - PPO | Admitting: Speech Pathology

## 2018-07-22 ENCOUNTER — Inpatient Hospital Stay (HOSPITAL_COMMUNITY): Payer: Federal, State, Local not specified - PPO | Admitting: Physical Therapy

## 2018-07-22 ENCOUNTER — Inpatient Hospital Stay (HOSPITAL_COMMUNITY): Payer: Federal, State, Local not specified - PPO | Admitting: Occupational Therapy

## 2018-07-22 LAB — BASIC METABOLIC PANEL
Anion gap: 8 (ref 5–15)
BUN: 24 mg/dL — ABNORMAL HIGH (ref 8–23)
CO2: 27 mmol/L (ref 22–32)
Calcium: 9.9 mg/dL (ref 8.9–10.3)
Chloride: 99 mmol/L (ref 98–111)
Creatinine, Ser: 1.1 mg/dL (ref 0.61–1.24)
GFR calc Af Amer: >60 mL/min (ref >60)
GFR calc non Af Amer: >60 mL/min (ref >60)
Glucose, Bld: 134 mg/dL — ABNORMAL HIGH (ref 70–99)
Potassium: 4.1 mmol/L (ref 3.5–5.1)
Sodium: 134 mmol/L — ABNORMAL LOW (ref 135–145)

## 2018-07-22 LAB — GLUCOSE, CAPILLARY
GLUCOSE-CAPILLARY: 87 mg/dL (ref 70–99)
Glucose-Capillary: 117 mg/dL — ABNORMAL HIGH (ref 70–99)
Glucose-Capillary: 138 mg/dL — ABNORMAL HIGH (ref 70–99)

## 2018-07-22 LAB — CBC
HEMATOCRIT: 38.9 % — AB (ref 39.0–52.0)
Hemoglobin: 13 g/dL (ref 13.0–17.0)
MCH: 28.8 pg (ref 26.0–34.0)
MCHC: 33.4 g/dL (ref 30.0–36.0)
MCV: 86.1 fL (ref 80.0–100.0)
Platelets: 294 10*3/uL (ref 150–400)
RBC: 4.52 MIL/uL (ref 4.22–5.81)
RDW: 11.3 % — ABNORMAL LOW (ref 11.5–15.5)
WBC: 9.6 10*3/uL (ref 4.0–10.5)
nRBC: 0 % (ref 0.0–0.2)

## 2018-07-22 MED ORDER — TEMAZEPAM 7.5 MG PO CAPS: 7.5000 mg | ORAL_CAPSULE | Freq: Every day | ORAL | 0 refills | Status: DC

## 2018-07-22 MED ORDER — METFORMIN HCL 500 MG PO TABS: 250.0000 mg | ORAL_TABLET | Freq: Every day | ORAL | Status: DC

## 2018-07-22 MED ORDER — QUETIAPINE FUMARATE 50 MG PO TABS: 50.0000 mg | ORAL_TABLET | Freq: Every day | ORAL | Status: DC

## 2018-07-22 MED ORDER — POLYETHYLENE GLYCOL 3350 17 G PO PACK: 17.0000 g | PACK | Freq: Every day | ORAL | 0 refills | Status: DC

## 2018-07-22 MED ORDER — PANTOPRAZOLE SODIUM 40 MG PO TBEC: 40.0000 mg | DELAYED_RELEASE_TABLET | Freq: Every day | ORAL | 3 refills | Status: AC

## 2018-07-22 NOTE — Progress Notes (Signed)
Speech Language Pathology Discharge Summary  Patient Details  Name: William [name] MRN: [id number] Date of Birth: January 28, 1951  Today's Date: 07/22/2018 SLP Individual Time: 1100-1200 SLP Individual Time Calculation (min): 60 min   Skilled Therapeutic Interventions:  Pt was seen for skilled ST targeting cognition and pt education. Pt utilized external memory aid with supervision cues to recall events of the day and orient himself to the day/date. SLP reviewed a memory strategy handout with pt and emphasized importance of 24/7 supervision/assistance with management of finances and medications upon return home; note about recommendation for 24/7 supervision left for family on handout. SLP also facilitated session with Max verbal cues for error awareness and problem solving during a semi-complex logic word puzzle. Pt sustained attention throughout session with supervision cues for redirection. Pt was returned to room, left laying in bed with alarm set, call bell within reach, and all questions answered to his satisfaction. Recommend continue per current plan of care.   Patient has met 5 of 6 long term goals.  Patient to discharge at overall Supervision level.  Reasons goals not met: Min-Mod A for emergent awareness   Clinical Impression/Discharge Summary:   Pt has made excellent functional gains while inpatient and is discharging having met 5 out of 6 long term goals.  Pt is currently Supervision for most tasks (sustained attention, basic problem solving, use of memory aids), however he required Min-Mod Assist for complex problem solving and emergent awareness due to cognitive impairment. Pt education is complete at this time; family was not present this reporting period, however memory strategy handout and note emphasizing importance of 24/7 supervision for complex tasks provided to pt.  Pt has demonstrated improved attention, intellectual awareness, and basic problem solving skills.  Per Research officer, political party, pt was offered bed at SNF and pt expressed interest in SNF, however pt's daughter prefers pt to discharge home 24/7 supervision. Recommend ST follow up for cognition at next level of care to facilitate greatest safety and functional independence.     Care Partner:  Caregiver Able to Provide Assistance: Yes  Type of Caregiver Assistance: Cognitive  Recommendation:  Home Health SLP;24 hour supervision/assistance  Rationale for SLP Follow Up: Maximize cognitive function and independence;Reduce caregiver burden   Equipment: none   Reasons for discharge: Discharged from hospital   Patient/Family Agrees with Progress Made and Goals Achieved: Yes   Jettie Booze, Student SLP   Jettie Booze 07/22/2018, 12:31 PM

## 2018-07-22 NOTE — Progress Notes (Signed)
Social Work Patient ID: [name], male   DOB: [birth year], 68 y.o.   MRN: [id number]  Pt and family spending most of today deciding whether pt will d/c home with family providing 24/7 assist vs SNF. Pt and family finally  made decision this afternoon that they wish to d/c to SNF for short -term stay.  Have confirmed SNF bed offer with [facility] for transfer tomorrow via daughter's car.  Tx team aware of d/c plan.  Taedyn Glasscock, LCSW

## 2018-07-22 NOTE — Discharge Instructions (Signed)
Inpatient Rehab Discharge Instructions  Rennie Rouch Discharge date and time:    Activities/Precautions/ Functional Status: Activity: no lifting, driving, or strenuous exercise till cleared by MD Diet: regular diet Wound Care: keep wound clean and dry   Functional status:  ___ No restrictions     ___ Walk up steps independently ___ 24/7 supervision/assistance   ___ Walk up steps with assistance ___ Intermittent supervision/assistance  ___ Bathe/dress independently ___ Walk with walker     ___ Bathe/dress with assistance ___ Walk Independently    ___ Shower independently ___ Walk with assistance    ___ Shower with assistance _X__ No alcohol     ___ Return to work/school ________    COMMUNITY REFERRALS UPON DISCHARGE:    Home Health:   PT     OT                       Agency:  [facility]      Phone: [phone]   Medical Equipment/Items Ordered:  Wheelchair, cushion, walker                                                      Agency/Supplier:  [facility] @ [phone]       Special Instructions:    My questions have been answered and I understand these instructions. I will adhere to these goals and the provided educational materials after my discharge from the hospital.  Patient/Caregiver Signature _______________________________ Date __________  Clinician Signature _______________________________________ Date __________  Please bring this form and your medication list with you to all your follow-up doctor's appointments.

## 2018-07-22 NOTE — Progress Notes (Signed)
Occupational Therapy Discharge Summary  Patient Details  Name: Evo Aderman MRN: [id number] Date of Birth: 15-Feb-1951  Today's Date: 07/22/2018 OT Individual Time: 1308-6578 OT Individual Time Calculation (min): 73 min   Session Note:  Pt completed bathing and dressing during session.  He was oriented to place, time, and situation when asked.  He could also state 3/3 back precautions, however frequently through session he would bend down to reach his feet for removing clothing or drying off.  Therapist would have to cue him to not bend and to give him the AE needed to be successful at the activity.  He was able to complete bathing with supervision as well as dressing with subsequent AE and min instructional cueing.  Pt did only donn underpants initially and needed cueing from therapist that he needed pants or shorts as he would be out of the room during therapy.  He could complete all transfers throughout the room with use of the RW as well and supervision.  He continues to sit down before bringing the walker and himself back to the surface of the chair or bed and needs mod instructional cueing for placement of his hands for sit to stand and stand to sit transitions.  Educated pt on tub/shower transfers with use of the walker and small tub seat that he already has at home.  He was able to complete with close supervision, but needed mod instructional for technique.  Finished session with pt supine in the bed with call button and phone in reach and safety alarm in place.   Patient has met 9 of 13 long term goals due to improved activity tolerance, improved balance, postural control, ability to compensate for deficits, improved attention and improved awareness.  Patient to discharge at overall Supervision level.  Patient's care partner unavailable to provide the necessary physical and cognitive assistance at discharge.    Reasons goals not met: Pt needs supervision for grooming, memory, and awareness  as well as supervision to min guard for attempted meal prep.   Recommendation:  Patient will benefit from ongoing skilled OT services in skilled nursing facility setting to continue to advance functional skills in the area of BADL and Reduce care partner burden.  Pt still demonstrates safety issues as well as overall weakness and decreased dynamic balance.  Feel he will benefit from continued OT at SNF level to continue working on selfcare retraining and ADLs in order to return home at highest level of function.    Equipment: No equipment provided  Reasons for discharge: treatment goals met and discharge from hospital  Patient/family agrees with progress made and goals achieved: Yes  OT Discharge Precautions/Restrictions  Precautions Precautions: Back;Fall Required Braces or Orthoses: Spinal Brace Spinal Brace: Applied in sitting position Restrictions Weight Bearing Restrictions: No Pain  Pain noted in lower back during session, which decreased with positional changes.  Rated at 2/5 on the faces scale. ADL ADL Equipment Provided: Reacher, Long-handled sponge, Sock aid Eating: Independent Where Assessed-Eating: Wheelchair Grooming: Supervision/safety Where Assessed-Grooming: Standing at sink Upper Body Bathing: Supervision/safety Where Assessed-Upper Body Bathing: Shower Lower Body Bathing: Supervision/safety Where Assessed-Lower Body Bathing: Shower Upper Body Dressing: Maximal assistance Where Assessed-Upper Body Dressing: Wheelchair Lower Body Dressing: Setup Where Assessed-Lower Body Dressing: Wheelchair Toileting: Supervision/safety Where Assessed-Toileting: Glass blower/designer: Close supervision Armed forces technical officer Method: Counselling psychologist: Engineer, technical sales Transfer: Close supervison Clinical cytogeneticist Method: Optometrist: Civil engineer, contracting with back Social research officer, government: Close supervision Social research officer, government  Method:  Ambulating Youth worker: Gaffer Baseline Vision/History: Wears glasses Wears Glasses: At all times Patient Visual Report: No change from baseline Vision Assessment?: No apparent visual deficits Perception  Perception: Within Functional Limits Praxis Praxis: Intact Cognition Overall Cognitive Status: Impaired/Different from baseline Arousal/Alertness: Awake/alert Orientation Level: Oriented X4 Attention: Selective Focused Attention: Appears intact Sustained Attention: Appears intact Selective Attention: Impaired Selective Attention Impairment: Functional complex;Functional basic Memory: Impaired Memory Impairment: Storage deficit;Decreased recall of new information;Decreased short term memory Decreased Short Term Memory: Verbal basic;Functional basic Awareness: Impaired Awareness Impairment: Anticipatory impairment;Emergent impairment Problem Solving: Impaired Problem Solving Impairment: Functional basic Safety/Judgment: Impaired Comments: Pt with decreased ability to follow back precautions.   Sensation Sensation Light Touch: Impaired by gross assessment Peripheral sensation comments: mild decreased appreciation to light touch in the R heel. L5-S1 dermatome  Light Touch Impaired Details: Impaired RLE;Impaired LLE Hot/Cold: Appears Intact Proprioception: Appears Intact Stereognosis: Appears Intact Additional Comments: Sensation intact in BUEs Coordination Gross Motor Movements are Fluid and Coordinated: No Fine Motor Movements are Fluid and Coordinated: No Coordination and Movement Description: decreased coordination of the RLE. greatly improved from Eval. BUE coordination [place]ger for selfcare tasks. Motor  Motor Motor: Abnormal postural alignment and control Motor - Discharge Observations: mild RLE discoordination, increased when distracted.  Mobility  Bed Mobility Bed Mobility: Supine to Sit;Sit to Supine Supine to Sit:  Supervision/Verbal cueing Sit to Supine: Supervision/Verbal cueing Transfers Sit to Stand: Supervision/Verbal cueing  Trunk/Postural Assessment  Cervical Assessment Cervical Assessment: Within Functional Limits Thoracic Assessment Thoracic Assessment: Exceptions to WFL(slight thoracic rounding, increased in standing) Lumbar Assessment Lumbar Assessment: Exceptions to WFL(lumbar flexion noted with posterior pelvic tilt in sitting.) Postural Control Postural Control: Deficits on evaluation(posterior LOB and lean in sitting and standing)  Balance Balance Balance Assessed: Yes Static Sitting Balance Static Sitting - Balance Support: Feet supported Static Sitting - Level of Assistance: 6: Modified independent (Device/Increase time) Dynamic Sitting Balance Dynamic Sitting - Balance Support: During functional activity Dynamic Sitting - Level of Assistance: 5: Stand by assistance Sitting balance - Comments: lean to the right when sitting EOB and attempting to use sockaide Static Standing Balance Static Standing - Balance Support: During functional activity;Bilateral upper extremity supported Static Standing - Level of Assistance: 5: Stand by assistance Dynamic Standing Balance Dynamic Standing - Balance Support: During functional activity;Bilateral upper extremity supported Dynamic Standing - Level of Assistance: 5: Stand by assistance Extremity/Trunk Assessment RUE Assessment RUE Assessment: Within Functional Limits LUE Assessment LUE Assessment: Within Functional Limits   Cayenne Breault,Emori OTR/L 07/22/2018, 4:56 PM

## 2018-07-22 NOTE — Progress Notes (Signed)
[place] PHYSICAL MEDICINE & REHABILITATION PROGRESS NOTE  Subjective/Complaints: Patient seen laying in bed this morning.  He states he slept well overnight.  He is more alert and quicker to respond this morning.  He states he needs to use the restroom.  He is looking forward to discharge tomorrow.  ROS: Denies CP, shortness of breath, nausea, vomiting, diarrhea.  Objective: Vital Signs: Blood pressure 128/86, pulse 92, temperature 98.2 F (36.8 C), temperature source Oral, resp. rate 18, height 5\' 11"  (1.803 m), weight 90.3 kg, SpO2 98 %. No results found. Recent Labs    07/22/18 0555  WBC 9.6  HGB 13.0  HCT 38.9*  PLT 294   Recent Labs    07/21/18 0529 07/22/18 0555  NA 136 134*  K 4.0 4.1  CL 102 99  CO2 26 27  GLUCOSE 128* 134*  BUN 26* 24*  CREATININE 1.07 1.10  CALCIUM 9.8 9.9    Physical Exam: BP 128/86 (BP Location: Right Arm)   Pulse 92   Temp 98.2 F (36.8 C) (Oral)   Resp 18   Ht 5\' 11"  (1.803 m)   Wt 90.3 kg   SpO2 98%   BMI 27.77 kg/m  Constitutional: No distress . Vital signs reviewed. HENT: Normocephalic.  Atraumatic. Eyes: EOMI. No discharge. Cardiovascular: RRR.  No JVD. Respiratory: CTA bilaterally.  Normal effort. GI: BS +. Non-distended. Musc: No edema or tenderness in extremities. Neuro: Alert and oriented x 3. Follows commands. Motor: Grossly 4+/5 throughout, unchanged Skin: Back with Steri-Strips C/D/I, not examined today Psych: Normal mood.  Normal behavior.  Assessment/Plan: 1. Functional deficits secondary to encephalopathy status post epidural hematoma with laminectomy which require 3+ hours per day of interdisciplinary therapy in a comprehensive inpatient rehab setting.  Physiatrist is providing close team supervision and 24 hour management of active medical problems listed below.  Physiatrist and rehab team continue to assess barriers to discharge/monitor patient progress toward functional and medical goals  Care  Tool:  Bathing    Body parts bathed by patient: Right arm, Left arm, Chest, Abdomen, Front perineal area, Buttocks, Right upper leg, Left upper leg, Right lower leg, Left lower leg, Face   Body parts bathed by helper: Chest, Front perineal area, Buttocks, Left lower leg, Right lower leg Body parts n/a: Right lower leg, Left lower leg   Bathing assist Assist Level: Contact Guard/Touching assist     Upper Body Dressing/Undressing Upper body dressing   What is the patient wearing?: Pull over shirt, Orthosis    Upper body assist Assist Level: Contact Guard/Touching assist    Lower Body Dressing/Undressing Lower body dressing      What is the patient wearing?: Pants     Lower body assist Assist for lower body dressing: Contact Guard/Touching assist     Toileting Toileting    Toileting assist Assist for toileting: Moderate Assistance - Patient 50 - 74%     Transfers Chair/bed transfer  Transfers assist     Chair/bed transfer assist level: Supervision/Verbal cueing     Locomotion Ambulation   Ambulation assist      Assist level: Supervision/Verbal cueing Assistive device: Walker-rolling Max distance: 110   Walk 10 feet activity   Assist  Walk 10 feet activity did not occur: Safety/medical concerns  Assist level: Supervision/Verbal cueing Assistive device: Walker-rolling   Walk 50 feet activity   Assist Walk 50 feet with 2 turns activity did not occur: Safety/medical concerns  Assist level: Supervision/Verbal cueing Assistive device: Walker-rolling  Walk 150 feet activity   Assist Walk 150 feet activity did not occur: Safety/medical concerns    Assistive device: Walker-rolling    Walk 10 feet on uneven surface  activity   Assist Walk 10 feet on uneven surfaces activity did not occur: Safety/medical concerns         Wheelchair     Assist   Type of Wheelchair: Manual    Wheelchair assist level: Supervision/Verbal cueing Max  wheelchair distance: 150    Wheelchair 50 feet with 2 turns activity    Assist        Assist Level: Supervision/Verbal cueing   Wheelchair 150 feet activity     Assist Wheelchair 150 feet activity did not occur: Safety/medical concerns(100 ft max per PT note)   Assist Level: Supervision/Verbal cueing      Medical Problem List and Plan: 1. Debility and cognitive deficits secondary to Epidural hematoma, post op lumbar laminectomy with encephalopathy  Continue CIR  Recent CT head reviewed, likely some degree of NPH, which may be contributing to symptoms.    Plan for d/c tomorrow  Will see patient for transitional care management in 1-2 weeks post-discharge  2. DVT Prophylaxis/Anticoagulation: Mechanical:Sequential compression devices, below kneeBilateral lower extremities 3. Pain Management:Tylenol as needed for now. Local measures with heat and/or ice.  4. Mood:LCSW to follow for evaluation and support. 5. Neuropsych: This patientis not fullycapable of making decisions onhisown behalf. 6. Skin/Wound Care:Monitor wound for healing and for any recurrent drainage. Monitor for signs of infection. 7. Fluids/Electrolytes/Nutrition:Monitor I's and O's. Offer nutritional supplements as with variable p.o. intake  BMP within acceptable range on 3/4 8. Delirium: Continue to monitor.  Discontinued Norco and flexeril  UA negative, urine culture greater than 100,000 Klebsiella. Keflex course completed on 3/3  Repeat urine culture ordered, no growth  Overall improving  Monitor for other signs of infection.  9.HTN: Monitor blood pressures twice daily. Continue Cozaar, Toprol-XL and amlodipine  Controlled on 3/5  Monitor with increased mobility 10.History of bladder cancer/BPH:Recent episode of urinary retention. Monitor voiding with cath for volumes greater than 350 cc.  Flomax started 11.Epidural hematoma/ABLA: Resolved  Monitor for signs of  bleeding  Hemoglobin 13.0 on 3/5 12.GERD: Continue Protonix 13.  Diabetes mellitus type 2  Hemoglobin A1c 7.5  Metformin 250 daily started on 2/25 CBG (last 3)  Recent Labs    07/21/18 1635 07/21/18 2115 07/22/18 0647  GLUCAP 134* 141* 117*   Relatively controlled on 3/5 14.  Hypoalbuminemia  Supplement initiated on 2/21 15.  Transaminitis  LFTs elevated, but improving on 2/25  Avoid hepatotoxic meds 16.  Hyponatremia:   Sodium 134 on 3/5  Continue to monitor 17. Sundowning  Sleep chart ordered  Trazodone DC'd on 2/27, Restoril started on 2/27- not helpful  Seroquel 25mg  not very helpful, increased to 50mg  3/1  Improving  18.  Constipation  Bowel meds scheduled on 3/3  LOS: 14 days A FACE TO FACE EVALUATION WAS PERFORMED  Ankit Lorie Phenix 07/22/2018, 8:48 AM

## 2018-07-22 NOTE — Patient Care Conference (Addendum)
Inpatient RehabilitationTeam Conference and Plan of Care Update Date: 07/21/2018   Time: 11:35 AM    Patient Name: William [name]      Medical Record Number: [id number]  Date of Birth: 03-Nov-1950 Sex: Male         Room/Bed: 4M05C/4M05C-01 Payor Info: Payor: MEDICARE / Plan: MEDICARE PART A / Product Type: *No Product type* /    Admitting Diagnosis: lumbar decompression with epidural hematoma  Admit Date/Time:  07/08/2018  4:03 PM Admission Comments: No comment available   Primary Diagnosis:  Epidural hematoma (HCC) Principal Problem: Epidural hematoma [facility])  Patient Active Problem List   Diagnosis Date Noted  . Slow transit constipation   . Diabetes mellitus type 2 in nonobese (HCC)   . Labile blood glucose   . Sundowning   . Fall   . Hyponatremia   . New onset type 2 diabetes mellitus ([place])   . Transaminitis   . Hypoalbuminemia due to protein-calorie malnutrition ([place])   . Hyperglycemia   . Urinary incontinence due to benign prostatic hyperplasia 07/08/2018  . Spinal stenosis of lumbar region with radiculopathy 07/08/2018  . Epidural hematoma ([place]) 07/05/2018  . Acute urinary retention 07/04/2018  . Lumbar stenosis with neurogenic claudication 02/24/2018  . S/P total knee replacement 12/08/2016    Expected Discharge Date: Expected Discharge Date: 07/23/18  Team Members Present: Physician leading conference: Dr. Alysia Penna Social Worker Present: Lennart Pall, LCSW Nurse Present: Rayetta Pigg, RN PT Present: Barrie Folk, PT OT Present: Other (comment)(Sandra Rosana Hoes, OT) SLP Present: Windell Moulding, SLP PPS Coordinator present : Gunnar Fusi     Current Status/Progress Goal Weekly Team Focus  Medical   Debility and cognitive deficitssecondary to Epidural hematoma, post op lumbar laminectomy with encephalopathy  Improve cognition, BP, DM, sleep/wake cycle, constipation  See above   Bowel/Bladder   Pt is continent B/B. Pt is constipated. LBM 07/17/2018.  Treat  constipation per orders.   Assist with toileting needs PRN.    Swallow/Nutrition/ Hydration             ADL's   Supervision UB and LB bathing/dressing, moderate cueing for adherence to back precautions and use of AD  supervision  pt/family education, back precautions ,use of AD in dressing    Mobility   Supervision assist with bed mobility, transfers, gait up to 181ft, WC mobility, and ascent of curb step. moderate cues for safety and back precautions.   Supervision assist overall with LRAD.   improved safety wity all mobility and improved awareness of back precautions. family education. discharge planning.    Communication             Safety/Cognition/ Behavioral Observations  Min-Mod assist for short term memory and problem solving; Supervision for attention   Supervision  Use of compensatory memory strategies, problem solving, awareness   Pain   No complaints of pain.  Remain pain free.  Assess pain Q shift and PRN.   Skin   Pt has surgical incision to lower back.  Treat incision per orders.  Assess skin Q shift and PRN.     Rehab Goals Patient on target to meet rehab goals: Yes *See Care Plan and progress notes for long and short-term goals.     Barriers to Discharge  Current Status/Progress Possible Resolutions Date Resolved   Physician    Medical stability;Decreased caregiver support;Lack of/limited family support     See above  Therapies, optimize sleep meds, follow labs, optimize HTN/DM meds  Nursing                  PT                    OT                  SLP                SW                Discharge Planning/Teaching Needs:  Plan on admission is for pt to d/c home with intermittent support, however, may now need 24/7 and daughter considering SNF as an option.  Teaching needs TBD   Team Discussion:  Still need caths?  Nursing to follow up and determine if family education needed.  dtr has begun DM ed.  Supervision with mobility and ADLs and needs a LOT  of cues for precautions and safety.  Cognition waxes and wanes but improved awareness and better attention overall.  SW reports family still considering short term SNF and will keep team posted  Revisions to Treatment Plan:  NA    Continued Need for Acute Rehabilitation Level of Care: The patient requires daily medical management by a physician with specialized training in physical medicine and rehabilitation for the following conditions: Daily direction of a multidisciplinary physical rehabilitation program to ensure safe treatment while eliciting the highest outcome that is of practical value to the patient.: Yes Daily medical management of patient stability for increased activity during participation in an intensive rehabilitation regime.: Yes Daily analysis of laboratory values and/or radiology reports with any subsequent need for medication adjustment of medical intervention for : Neurological problems;Diabetes problems;Other;Blood pressure problems   I attest that I was present, lead the team conference, and concur with the assessment and plan of the team.   Rue Valladares 07/22/2018, 2:20 PM

## 2018-07-22 NOTE — Progress Notes (Signed)
Pt using home machine and mask.  Tolerating well.

## 2018-07-22 NOTE — Progress Notes (Addendum)
Occupational Therapy Session Note  Patient Details  Name: William [name] MRN: [id number] Date of Birth: Jun 06, 1950  Today's Date: 3/42020 OT Individual Time:  - 13:00-13:35    Late entry for 07/21/18  Short Term Goals: Week 1:  OT Short Term Goal 1 (Week 1): Pt will maintain sustained attention to selfcare tasks for 30 mins with no more than min instructional cueing for re-direction. OT Short Term Goal 1 - Progress (Week 1): Not met OT Short Term Goal 2 (Week 1): Pt will completed donning and doffing of his back brace with setup only. OT Short Term Goal 2 - Progress (Week 1): Not met OT Short Term Goal 3 (Week 1): Pt will complete LB dressing with AE and min assist sit to stand.  OT Short Term Goal 3 - Progress (Week 1): Met OT Short Term Goal 4 (Week 1): Pt will perform walk-in shower transfer with supervision to shower seat. OT Short Term Goal 4 - Progress (Week 1): Not met OT Short Term Goal 5 (Week 1): Pt will state 3/3 back precautions with supervision for two consecutive sessions. OT Short Term Goal 5 - Progress (Week 1): Not met  Skilled Therapeutic Interventions/Progress Updates:    1:1 Focus on self care retraining at shower level in ADL apartment. PT able to use the tub bench to get into the tub with LSO donned to help maintain back precautions. Pt able to to bathe 10/10 parts sit to stands and long handled sponge. Pt requires max VC throughout session to maintain back precautions with functional tasks and mobility. Pt able to step over the side of the tub with steadying A and sit on chair to dress LB with setup and VC (supervision) with use of reacher and sock aide to recall proper use.   Therapy Documentation Precautions:  Precautions Precautions: Back, Fall Required Braces or Orthoses: Spinal Brace Spinal Brace: Applied in sitting position Restrictions Weight Bearing Restrictions: No Pain: Pain Assessment Pain Scale: 0-10 Pain Score: 0-No pain   Therapy/Group:  Individual Therapy  Willeen Cass [facility] 07/22/2018, 8:34 AM

## 2018-07-22 NOTE — Progress Notes (Signed)
Physical Therapy Discharge Summary  Patient Details  Name: William [name] MRN: [id number] Date of Birth: March 09, 1951  Today's Date: 07/22/2018 PT Individual Time: 1400-1500   60 min    Patient has met 8 of 9 long term goals due to improved activity tolerance, improved balance, improved postural control, increased strength, increased range of motion, decreased pain, functional use of  right lower extremity and left lower extremity, improved attention and improved awareness.  Patient to discharge at an ambulatory level Supervision.   Patient's care partner unavailable to provide the necessary physical and cognitive assistance at discharge.  Reasons goals not met: Pt continues to require BUE support on Rails for stair management for safety due to R LE strength and coordination deficits.   Recommendation:  Patient will benefit from ongoing skilled PT services in skilled nursing facility setting to continue to advance safe functional mobility, address ongoing impairments in balance, strength, safety, awareness, cognition, gait, and minimize fall risk.  Equipment: No equipment provided  Reasons for discharge: treatment goals met and discharge from hospital  Patient/family agrees with progress made and goals achieved: Yes   PT treatment:  Pt received supine in bed and agreeable to PT. Supine>sit transfer with  Supervision assist and use of bed rails. PT instructed pt in Grad day assessment to measure progress toward goals. See below for details. Family education with son for safety in car transfers, gait, stair management, back precautions. Assist from son and PT as listed below Pt returned to room and performed stand pivot transfer to bed with supervision. Sit>supine completed with supervision and left supine in bed with call bell in reach and all needs met.       PT Discharge Precautions/Restrictions   back. Fall  Vital Signs Therapy Vitals Temp: (!) 97.5 F (36.4 C) Temp Source:  Oral Pulse Rate: 90 Resp: 16 BP: 123/67 Patient Position (if appropriate): Lying Oxygen Therapy SpO2: 97 % O2 Device: Room Air Pain Pain Assessment Pain Score: 0-No pain Vision/Perception    WFL Cognition Overall Cognitive Status: Impaired/Different from baseline Arousal/Alertness: Awake/alert Orientation Level: Oriented X4 Attention: Selective Focused Attention: Appears intact Sustained Attention: Appears intact Selective Attention: Impaired Selective Attention Impairment: Verbal basic;Functional basic Memory: Impaired Memory Impairment: Storage deficit;Decreased recall of new information;Decreased short term memory Decreased Short Term Memory: Verbal basic;Functional basic Awareness: Impaired Awareness Impairment: Emergent impairment Problem Solving: Impaired Problem Solving Impairment: Verbal complex;Functional complex Safety/Judgment: Impaired Sensation Sensation Light Touch: Impaired by gross assessment Peripheral sensation comments: mild decreased appreciation to light touch in the R heel. L5-S1 dermatome  Coordination Gross Motor Movements are Fluid and Coordinated: No Coordination and Movement Description: decreased coordination of the RLE. greatly improved from Eval.  Motor  Motor Motor: Abnormal postural alignment and control Motor - Discharge Observations: mild RLE discoordination, increased when distracted.   Mobility Bed Mobility Bed Mobility: Supine to Sit;Sit to Supine Supine to Sit: Supervision/Verbal cueing Sit to Supine: Supervision/Verbal cueing Transfers Sit to Stand: Supervision/Verbal cueing Stand Pivot Transfers: Supervision/Verbal cueing Transfer (Assistive device): Rolling walker Locomotion  Gait Ambulation: Yes Gait Assistance: Supervision/Verbal cueing Gait Distance (Feet): 150 Feet Assistive device: Rolling walker Gait Gait: Yes Gait Pattern: Impaired Gait Pattern: Decreased step length - right;Poor foot clearance - right Stairs  / Additional Locomotion Stairs: Yes Stairs Assistance: Minimal Assistance - Patient > 75% Stair Management Technique: Two rails Number of Stairs: 12 Height of Stairs: 6 Wheelchair Mobility Wheelchair Mobility: Yes Wheelchair Assistance: Chartered loss adjuster: Both upper extremities Wheelchair Parts Management: Needs  assistance Distance: 167f  Trunk/Postural Assessment  Cervical Assessment Cervical Assessment: Within Functional Limits Thoracic Assessment Thoracic Assessment: Within Functional Limits Lumbar Assessment Lumbar Assessment: Exceptions to WFL(lumbar corset for precautions and stability) Postural Control Postural Control: Deficits on evaluation(posterior LOB and lean in sitting and standing)  Balance Balance Balance Assessed: Yes Static Sitting Balance Static Sitting - Level of Assistance: 6: Modified independent (Device/Increase time) Dynamic Sitting Balance Dynamic Sitting - Level of Assistance: 5: Stand by assistance Static Standing Balance Static Standing - Level of Assistance: 5: Stand by assistance Dynamic Standing Balance Dynamic Standing - Level of Assistance: 5: Stand by assistance Extremity Assessment    LUE Assessment LUE Assessment: Within Functional Limits(AROM WFLs for selfcare tasks.  Not formally tested secondary to precautions) RLE Assessment RLE Assessment: Exceptions to [facility] Strength RLE Overall Strength Comments: 4+/5 to 5/5      ALorie Phenix3/09/2018, 2:45 PM

## 2018-07-23 MED ORDER — TEMAZEPAM 7.5 MG PO CAPS: 7.5000 mg | ORAL_CAPSULE | Freq: Every day | ORAL | 0 refills | Status: DC

## 2018-07-23 MED ORDER — QUETIAPINE FUMARATE 50 MG PO TABS: 50.0000 mg | ORAL_TABLET | Freq: Every day | ORAL | 0 refills | Status: DC

## 2018-07-23 NOTE — Progress Notes (Signed)
Report called to [place]. Patient discharged with family and NT, all belongings accounted for, no questions at this time.

## 2018-07-23 NOTE — Progress Notes (Signed)
Social Work  Discharge Note  The overall goal for the admission was met for:   Discharge location: Yes[place] [place]  Length of Stay: Yes-15 DAYS  Discharge activity level: Yes-MIN ASSIST LEVEL  Home/community participation: Yes  Services provided included: MD, RD, PT, OT, SLP, RN, CM, Pharmacy and SW  Financial Services: Medicare and Private Insurance: [place]  Follow-up services arranged: Other: SHORT TERM NHP  Comments (or additional information):WILL GO TO DAUGHTER'S HOME ONCE DISCHARGED FROM NH AND INFORMED THEM EQUIPMENT COULD BE GOTTEN THEN ALONG WITH HOME HEALTH THERAPIES.  Patient/Family verbalized understanding of follow-up arrangements: Yes  Individual responsible for coordination of the follow-up plan: `  Confirmed correct DME delivered: Elease Hashimoto 07/23/2018    Elease Hashimoto

## 2018-08-26 ENCOUNTER — Other Ambulatory Visit: Payer: Self-pay

## 2018-08-26 ENCOUNTER — Encounter: Payer: Self-pay | Admitting: Physical Medicine & Rehabilitation

## 2018-08-26 ENCOUNTER — Encounter
Payer: Federal, State, Local not specified - PPO | Attending: Physical Medicine & Rehabilitation | Admitting: Physical Medicine & Rehabilitation

## 2018-08-26 VITALS — BP 148/74 | HR 80 | Ht 71.0 in | Wt 206.2 lb

## 2018-08-26 DIAGNOSIS — E119 Type 2 diabetes mellitus without complications: Secondary | ICD-10-CM | POA: Diagnosis not present

## 2018-08-26 DIAGNOSIS — M48062 Spinal stenosis, lumbar region with neurogenic claudication: Secondary | ICD-10-CM

## 2018-08-26 DIAGNOSIS — I10 Essential (primary) hypertension: Secondary | ICD-10-CM

## 2018-08-26 DIAGNOSIS — Z8551 Personal history of malignant neoplasm of bladder: Secondary | ICD-10-CM

## 2018-08-26 DIAGNOSIS — R269 Unspecified abnormalities of gait and mobility: Secondary | ICD-10-CM

## 2018-08-26 DIAGNOSIS — G4733 Obstructive sleep apnea (adult) (pediatric): Secondary | ICD-10-CM | POA: Diagnosis present

## 2018-08-26 NOTE — Progress Notes (Signed)
Subjective:    Patient ID: William [name], male    DOB: November 02, 1950, 68 y.o.   MRN: [id number]  HPI 68 year old male with history of HTN, bladder cancer, BPH, lumbar stenosis with radiculopathy status post decompression 02/2018 and recent surgery presents for follow-up after receiving CIR for lumbar laminectomy and encephalopathy.  He was discharged to SNF. H is now home.  At discharge he was instructed to follow up with Neurosurg, which he did. He saw PCP and meds were adjusted. He is wearing his TLSO. CBGs have been in ~130s. He is awaiting on CPAP. He is driving. He states his labs have not been checked since he was discharged. BP relatively controlled at home per pt. Sleep is fair. Bowel movements are regular. Denies falls. Denies bladder issues.   Therapies: 1/week DME: Previously possessed  Mobility: No device requires.   Pain Inventory Average Pain 4 Pain Right Now 6 My pain is dull  In the last 24 hours, has pain interfered with the following? General activity 5 Relation with others 5 Enjoyment of life 8 What TIME of day is your pain at its worst? varies Sleep (in general) Fair  Pain is worse with: walking, bending, standing and some activites Pain improves with: rest and therapy/exercise Relief from Meds: na  Mobility walk without assistance walk with assistance use a walker how many minutes can you walk? 5 ability to climb steps?  yes do you drive?  yes  Function retired  Neuro/Psych trouble walking  Prior Studies Any changes since last visit?  yes [place] out of wheelchair at [employer], had a CT scan of head Physicians involved in your care Any changes since last visit?  no   Family History  Problem Relation Age of Onset  . Cancer Mother   . Congestive Heart Failure Father    Social History   Socioeconomic History  . Marital status: Divorced    Spouse name: Not on file  . Number of children: Not on file  . Years of education: Not on file  .  Highest education level: Not on file  Occupational History  . Not on file  Social Needs  . Financial resource strain: Not on file  . Food insecurity:    Worry: Not on file    Inability: Not on file  . Transportation needs:    Medical: Not on file    Non-medical: Not on file  Tobacco Use  . Smoking status: Former Smoker    Last attempt to quit: 08/29/2002    Years since quitting: 16.0  . Smokeless tobacco: Never Used  Substance and Sexual Activity  . Alcohol use: Yes    Alcohol/week: 2.0 - 3.0 standard drinks    Types: 2 - 3 Shots of liquor per week    Comment: sometimes goes weeks without drinking   . Drug use: No  . Sexual activity: Not on file  Lifestyle  . Physical activity:    Days per week: Not on file    Minutes per session: Not on file  . Stress: Not on file  Relationships  . Social connections:    Talks on phone: Not on file    Gets together: Not on file    Attends religious service: Not on file    Active member of club or organization: Not on file    Attends meetings of clubs or organizations: Not on file    Relationship status: Not on file  Other Topics Concern  . Not on  file  Social History Narrative  . Not on file   Past Surgical History:  Procedure Laterality Date  . BLADDER TUMOR EXCISION  2017   several cystocscopy for the excision followed by M. Annitta Needs  . BLEPHAROPLASTY Bilateral 2017   in [place]  . CYSTOSCOPY    . HEMATOMA EVACUATION N/A 07/04/2018   Procedure: Lumbar wound exploration and EVACUATION OF EPIDURAL HEMATOMA;  Surgeon: Newman Pies, MD;  Location: [facility];  Service: Neurosurgery;  Laterality: N/A;  . JOINT REPLACEMENT    . KNEE ARTHROSCOPY     2 on each side, one being a repair of the meniscus     . LUMBAR LAMINECTOMY/DECOMPRESSION MICRODISCECTOMY N/A 02/24/2018   Procedure: Lumbar Four-Five Laminectomy/Foraminotomy;  Surgeon: Ashok Pall, MD;  Location: [facility];  Service: Neurosurgery;  Laterality: N/A;  . SHOULDER  ARTHROSCOPY Right 2016  . TONSILLECTOMY    . TOTAL KNEE ARTHROPLASTY Right 12/08/2016   Procedure: TOTAL KNEE ARTHROPLASTY;  Surgeon: Vickey Huger, MD;  Location: [facility];  Service: Orthopedics;  Laterality: Right;   Past Medical History:  Diagnosis Date  . Arthritis    OA- knees   . Bell's palsy 2014  . Bladder cancer ([place])   . BPH (benign prostatic hyperplasia)   . GERD (gastroesophageal reflux disease)   . Gout   . Hypertension   . Sleep apnea    CPAP-in use q night, last study 5 yrs. ago  . Spinal stenosis of lumbar region with radiculopathy    BP (!) 148/74   Pulse 80   Ht 5\' 11"  (1.803 m)   Wt 206 lb 3.2 oz (93.5 kg)   SpO2 97%   BMI 28.76 kg/m   Opioid Risk Score:   Fall Risk Score:  `1  Depression screen PHQ 2/9  Depression screen PHQ 2/9 08/26/2018  Decreased Interest 2  Down, Depressed, Hopeless 2  PHQ - 2 Score 4  Altered sleeping 2  Tired, decreased energy 2  Change in appetite 2  Feeling bad or failure about yourself  0  Trouble concentrating 0  Moving slowly or fidgety/restless 0  Suicidal thoughts 0  PHQ-9 Score 10  Difficult doing work/chores Not difficult at all    Review of Systems  Constitutional: Negative.   HENT: Negative.   Eyes: Negative.   Respiratory: Negative.   Cardiovascular: Negative.   Gastrointestinal: Negative.   Endocrine: Negative.   Genitourinary: Negative.   Musculoskeletal: Positive for arthralgias, back pain, gait problem and myalgias.  Skin: Negative.   Allergic/Immunologic: Negative.   Neurological: Negative for weakness.  Hematological: Negative.   Psychiatric/Behavioral: Negative.   All other systems reviewed and are negative.     Objective:   Physical Exam Constitutional: No distress . Vital signs reviewed. HENT: Normocephalic.  Atraumatic. Eyes: EOMI. No discharge. Cardiovascular: No JVD. Respiratory: Normal effort. GI: Non-distended. Musc: No edema or tenderness in extremities. Neuro: Alert and oriented  x 3. Follows commands. Motor: Grossly 4+/5 throughout, improving Psych: Normal mood.  Normal behavior.    Assessment & Plan:  68 year old male with history of HTN, bladder cancer, BPH, lumbar stenosis with radiculopathy status post decompression 02/2018 and recent surgery presents for follow-up after receiving CIR for lumbar laminectomy and encephalopathy.  1. Debility secondary to Epidural hematoma s/p post lumbar laminectomy   Encephalopathy resolved  2. Pain Management:   Mainly in back in legs - not taking medications  Tylenol as needed   3. Delirium:   Resolved  4.  HTN:  Cont meds  Elevated today, however controlled per pt  5. .  History of bladder cancer/BPH:   Controlled at present  6.  Diabetes mellitus type 2  Meds being adjusted by PCP  Relatively controlled at present- per pt  7. OSA  Awaiting CPAP  8. Gait abnormality  Encouraged walker for safety  Cont therapies

## 2018-11-01 ENCOUNTER — Ambulatory Visit: Payer: Federal, State, Local not specified - PPO | Admitting: Physical Medicine & Rehabilitation

## 2018-11-03 ENCOUNTER — Ambulatory Visit: Payer: Federal, State, Local not specified - PPO | Admitting: Physical Medicine & Rehabilitation

## 2018-11-24 ENCOUNTER — Other Ambulatory Visit: Payer: Self-pay

## 2018-11-24 ENCOUNTER — Ambulatory Visit (INDEPENDENT_AMBULATORY_CARE_PROVIDER_SITE_OTHER): Payer: Federal, State, Local not specified - PPO

## 2018-11-24 ENCOUNTER — Ambulatory Visit: Payer: Federal, State, Local not specified - PPO | Admitting: Podiatry

## 2018-11-24 ENCOUNTER — Encounter: Payer: Self-pay | Admitting: Podiatry

## 2018-11-24 VITALS — BP 149/79 | HR 70 | Temp 97.3°F

## 2018-11-24 DIAGNOSIS — M7750 Other enthesopathy of unspecified foot: Secondary | ICD-10-CM

## 2018-11-24 DIAGNOSIS — M7752 Other enthesopathy of left foot: Secondary | ICD-10-CM | POA: Diagnosis not present

## 2018-11-24 DIAGNOSIS — G5792 Unspecified mononeuropathy of left lower limb: Secondary | ICD-10-CM

## 2018-11-24 DIAGNOSIS — R600 Localized edema: Secondary | ICD-10-CM

## 2018-11-24 MED ORDER — GABAPENTIN 100 MG PO CAPS: 100.0000 mg | ORAL_CAPSULE | Freq: Every day | ORAL | 1 refills | Status: DC

## 2018-11-24 MED ORDER — METHYLPREDNISOLONE 4 MG PO TBPK: ORAL_TABLET | ORAL | 0 refills | Status: DC

## 2018-11-24 NOTE — Progress Notes (Signed)
   HPI: 68 year old male presents the office today as a new patient for evaluation of swelling to the bilateral feet.  This is been going on for several months now and he has had foot pain to the entire bottom of his left foot.  He has a history of 2 back surgeries, the first 1 in October 2019 and the second 19 June 2018.  Patient states that after the back surgery in February 2020 is when his foot started getting swollen and he began experiencing pain.  He begin taking meloxicam without any significant alleviation of symptoms.  Past Medical History:  Diagnosis Date  . Arthritis    OA- knees   . Bell's palsy 2014  . Bladder cancer ([place])   . BPH (benign prostatic hyperplasia)   . GERD (gastroesophageal reflux disease)   . Gout   . Hypertension   . Sleep apnea    CPAP-in use q night, last study 5 yrs. ago  . Spinal stenosis of lumbar region with radiculopathy      Physical Exam: General: The patient is alert and oriented x3 in no acute distress.  Dermatology: Skin is warm, dry and supple bilateral lower extremities. Negative for open lesions or macerations.  Vascular: Palpable pedal pulses bilaterally.  Negative for any erythema.  Moderate pitting edema noted to the bilateral feet. Capillary refill within normal limits.  Neurological: Epicritic and protective threshold grossly intact bilaterally.  Positive Tinel sign with percussion of the posterior tibial nerve that extends into the medial longitudinal arch of the foot  Musculoskeletal Exam: Range of motion within normal limits to all pedal and ankle joints bilateral. Muscle strength 5/5 in all groups bilateral.   Radiographic Exam:  Normal osseous mineralization. Joint spaces preserved. No fracture/dislocation/boney destruction.    Assessment: 1.  Bilateral foot edema, idiopathic 2.  Neuritis left foot   Plan of Care:  1. Patient evaluated. X-Rays reviewed.  2.  Injection of 0.5 cc Celestone Soluspan injected along the  medial longitudinal arch/tibial nerve left foot 3.  Prescription for Medrol Dosepak, then continue meloxicam 15 mg daily 4.  Prescription for gabapentin 100 mg nightly 5.  Compression ankle sleeves dispensed bilateral 6.  Return to clinic in 4 weeks, patient was instructed to follow-up with neurology after complete work-up with podiatry.  Recommend follow-up with neurology after our 4-week follow-up to see if there is any improvement first.     Edrick Kins, DPM [facility]  Dr. Edrick Kins, DPM    [address], [place] [address]                Office [phone]  Fax [phone]

## 2018-12-16 ENCOUNTER — Other Ambulatory Visit: Payer: Self-pay | Admitting: Podiatry

## 2018-12-22 ENCOUNTER — Other Ambulatory Visit: Payer: Self-pay

## 2018-12-22 ENCOUNTER — Encounter: Payer: Self-pay | Admitting: Podiatry

## 2018-12-22 ENCOUNTER — Ambulatory Visit: Payer: Federal, State, Local not specified - PPO | Admitting: Podiatry

## 2018-12-22 VITALS — Temp 98.0°F

## 2018-12-22 DIAGNOSIS — G5792 Unspecified mononeuropathy of left lower limb: Secondary | ICD-10-CM

## 2018-12-22 DIAGNOSIS — R600 Localized edema: Secondary | ICD-10-CM

## 2018-12-25 NOTE — Progress Notes (Signed)
   HPI: 68 year old male presents the office today for follow up evaluation of bilateral foot edema and neuritis of the left foot. He states the swelling has improved but he is still experiences some pain in the left foot. He has been taking Meloxicam and Gabapentin as directed. He states the compression sleeves were difficult to use. There are no worsening factors noted. Patient is here for further evaluation and treatment.   Past Medical History:  Diagnosis Date  . Arthritis    OA- knees   . Bell's palsy 2014  . Bladder cancer ([place])   . BPH (benign prostatic hyperplasia)   . GERD (gastroesophageal reflux disease)   . Gout   . Hypertension   . Sleep apnea    CPAP-in use q night, last study 5 yrs. ago  . Spinal stenosis of lumbar region with radiculopathy      Physical Exam: General: The patient is alert and oriented x3 in no acute distress.  Dermatology: Skin is warm, dry and supple bilateral lower extremities. Negative for open lesions or macerations.  Vascular: Palpable pedal pulses bilaterally.  Negative for any erythema.  Moderate pitting edema noted to the bilateral feet. Capillary refill within normal limits.  Neurological: Epicritic and protective threshold grossly intact bilaterally.  Positive Tinel sign with percussion of the posterior tibial nerve that extends into the medial longitudinal arch of the foot  Musculoskeletal Exam: Range of motion within normal limits to all pedal and ankle joints bilateral. Muscle strength 5/5 in all groups bilateral.   Assessment: 1. Bilateral foot edema, idiopathic - improved  2. Neuritis left foot    Plan of Care:  1. Patient evaluated.  2. Continue taking Meloxicam 15 mg.  3. Increase Gabapentin 100 mg to BID vs QHS.  4. Injection of 0.5 mLs Celestone Soluspan injected along the medial longitudinal arch/tibial nerve of the left foot.  5. Continue follow up with neurology.  6. Return to clinic as needed.      Edrick Kins, DPM  [facility]  Dr. Edrick Kins, DPM    [address], [place] [address]                Office [phone]  Fax [phone]

## 2019-02-09 ENCOUNTER — Other Ambulatory Visit: Payer: Self-pay | Admitting: Neurosurgery

## 2019-02-09 ENCOUNTER — Other Ambulatory Visit (HOSPITAL_COMMUNITY): Payer: Self-pay | Admitting: Neurosurgery

## 2019-02-09 DIAGNOSIS — M4316 Spondylolisthesis, lumbar region: Secondary | ICD-10-CM

## 2019-02-18 ENCOUNTER — Other Ambulatory Visit: Payer: Self-pay

## 2019-02-18 ENCOUNTER — Ambulatory Visit (HOSPITAL_COMMUNITY)
Admission: RE | Admit: 2019-02-18 | Discharge: 2019-02-18 | Disposition: A | Discharge status: Home / Self Care | Payer: Federal, State, Local not specified - PPO | Source: Ambulatory Visit | Attending: Neurosurgery | Admitting: Neurosurgery

## 2019-02-18 ENCOUNTER — Other Ambulatory Visit: Payer: Self-pay | Admitting: Neurosurgery

## 2019-02-18 DIAGNOSIS — M48061 Spinal stenosis, lumbar region without neurogenic claudication: Secondary | ICD-10-CM | POA: Insufficient documentation

## 2019-02-18 DIAGNOSIS — M4316 Spondylolisthesis, lumbar region: Secondary | ICD-10-CM

## 2019-02-18 DIAGNOSIS — M5136 Other intervertebral disc degeneration, lumbar region: Secondary | ICD-10-CM | POA: Diagnosis not present

## 2019-02-18 DIAGNOSIS — G039 Meningitis, unspecified: Secondary | ICD-10-CM | POA: Diagnosis not present

## 2019-02-18 MED ORDER — LIDOCAINE HCL (PF) 1 % IJ SOLN: 5.0000 mL | Freq: Once | INTRAMUSCULAR | Status: DC

## 2019-02-18 MED ORDER — ONDANSETRON HCL 4 MG/2ML IJ SOLN: 4.0000 mg | Freq: Four times a day (QID) | INTRAMUSCULAR | Status: DC | PRN

## 2019-02-18 MED ORDER — OXYCODONE-ACETAMINOPHEN 5-325 MG PO TABS: 1.0000 | ORAL_TABLET | ORAL | Status: DC | PRN

## 2019-02-18 MED ORDER — IOHEXOL 180 MG/ML  SOLN
20.0000 mL | Freq: Once | INTRAMUSCULAR | Status: AC | PRN
  Administered 2019-02-18: 10:00:00 20 mL via INTRATHECAL

## 2019-02-18 NOTE — Discharge Instructions (Signed)
Myelogram and Lumbar Puncture Discharge Instructions ° °1. Go home and rest quietly for the next 24 hours.  It is important to lie flat for the next 24 hours.  Get up only to go to the restroom.  You may lie in the bed or on a couch on your back, your stomach, your left side or your right side.  You may have one pillow under your head.  You may have pillows between your knees while you are on your side or under your knees while you are on your back. ° °2. DO NOT drive today.  Recline the seat as far back as it will go, while still wearing your seat belt, on the way home. ° °3. You may get up to go to the bathroom as needed.  You may sit up for 10 minutes to eat.  You may resume your normal diet and medications unless otherwise indicated. ° °4. The incidence of headache, nausea, or vomiting is about 5% (one in 20 patients).  If you develop a headache, lie flat and drink plenty of fluids until the headache goes away.  Caffeinated beverages may be helpful.  If you develop severe nausea and vomiting or a headache that does not go away with flat bed rest, call Dr Cabbell. ° °5. You may resume normal activities after your 24 hours of bed rest is over; however, do not exert yourself strongly or do any heavy lifting tomorrow. ° °6. Call your physician for a follow-up appointment.  The results of your myelogram will be sent directly to your physician by the following day. ° °7. If you have any questions or if complications develop after you arrive home, please call Dr Cabbell. ° °Discharge instructions have been explained to the patient.  The patient, or the person responsible for the patient, fully understands these instructions. ° ° °

## 2019-02-18 NOTE — Op Note (Signed)
02/18/2019 Lumbar Myelogram  PATIENT:  William [name] is a 68 y.o. male  PRE-OPERATIVE DIAGNOSIS:  lumbago  POST-OPERATIVE DIAGNOSIS:  Stenosis, L3/4  PROCEDURE:  Lumbar Myelogram  SURGEON:  Mcihael Hinderman  ANESTHESIA:   local LOCAL MEDICATIONS USED:  LIDOCAINE  Procedure Note: William [name] is a 68 y.o. male Was taken to the fluoroscopy suite and  positioned prone on the fluoroscopy table. His back was prepared and draped in a sterile manner. I infiltrated 5 cc into the lumbar region. I then introduced a spinal needle into the thecal sac at the L4/5 interlaminar space. I infiltrated 20cc of Isovue 190 into the thecal sac. Fluoroscopy showed the needle and contrast in the thecal sac. William [name] tolerated the procedure well. he Will be taken to CT for evaluation.     PATIENT DISPOSITION:  Short Stay

## 2019-02-22 ENCOUNTER — Other Ambulatory Visit: Payer: Self-pay | Admitting: Neurosurgery

## 2019-03-09 ENCOUNTER — Encounter (HOSPITAL_COMMUNITY): Admission: RE | Payer: Self-pay | Source: Home / Self Care

## 2019-03-09 ENCOUNTER — Inpatient Hospital Stay (HOSPITAL_COMMUNITY)
Admission: RE | Admit: 2019-03-09 | Payer: Federal, State, Local not specified - PPO | Source: Home / Self Care | Admitting: Neurosurgery

## 2019-03-09 SURGERY — POSTERIOR LUMBAR FUSION 1 LEVEL
Anesthesia: General

## 2019-04-12 ENCOUNTER — Other Ambulatory Visit: Payer: Self-pay

## 2019-04-12 ENCOUNTER — Encounter (HOSPITAL_COMMUNITY): Payer: Self-pay | Admitting: Internal Medicine

## 2019-04-12 ENCOUNTER — Inpatient Hospital Stay (HOSPITAL_COMMUNITY)
Admission: AD | Admit: 2019-04-12 | Discharge: 2019-04-17 | DRG: 177 | Disposition: A | Discharge status: Skilled Nursing Facility | Payer: Medicare Other | Source: Other Acute Inpatient Hospital | Attending: Internal Medicine | Admitting: Internal Medicine

## 2019-04-12 DIAGNOSIS — J1289 Other viral pneumonia: Secondary | ICD-10-CM | POA: Diagnosis present

## 2019-04-12 DIAGNOSIS — Z6830 Body mass index (BMI) 30.0-30.9, adult: Secondary | ICD-10-CM | POA: Diagnosis not present

## 2019-04-12 DIAGNOSIS — E669 Obesity, unspecified: Secondary | ICD-10-CM | POA: Diagnosis present

## 2019-04-12 DIAGNOSIS — Z8249 Family history of ischemic heart disease and other diseases of the circulatory system: Secondary | ICD-10-CM | POA: Diagnosis not present

## 2019-04-12 DIAGNOSIS — E785 Hyperlipidemia, unspecified: Secondary | ICD-10-CM | POA: Diagnosis present

## 2019-04-12 DIAGNOSIS — E11649 Type 2 diabetes mellitus with hypoglycemia without coma: Secondary | ICD-10-CM | POA: Diagnosis not present

## 2019-04-12 DIAGNOSIS — U071 COVID-19: Principal | ICD-10-CM

## 2019-04-12 DIAGNOSIS — I2510 Atherosclerotic heart disease of native coronary artery without angina pectoris: Secondary | ICD-10-CM | POA: Diagnosis present

## 2019-04-12 DIAGNOSIS — Z9221 Personal history of antineoplastic chemotherapy: Secondary | ICD-10-CM | POA: Diagnosis not present

## 2019-04-12 DIAGNOSIS — K219 Gastro-esophageal reflux disease without esophagitis: Secondary | ICD-10-CM | POA: Diagnosis present

## 2019-04-12 DIAGNOSIS — E860 Dehydration: Secondary | ICD-10-CM | POA: Diagnosis present

## 2019-04-12 DIAGNOSIS — R269 Unspecified abnormalities of gait and mobility: Secondary | ICD-10-CM

## 2019-04-12 DIAGNOSIS — E119 Type 2 diabetes mellitus without complications: Secondary | ICD-10-CM

## 2019-04-12 DIAGNOSIS — Z87891 Personal history of nicotine dependence: Secondary | ICD-10-CM | POA: Diagnosis not present

## 2019-04-12 DIAGNOSIS — E1165 Type 2 diabetes mellitus with hyperglycemia: Secondary | ICD-10-CM | POA: Diagnosis present

## 2019-04-12 DIAGNOSIS — N400 Enlarged prostate without lower urinary tract symptoms: Secondary | ICD-10-CM | POA: Diagnosis present

## 2019-04-12 DIAGNOSIS — G9341 Metabolic encephalopathy: Secondary | ICD-10-CM | POA: Diagnosis present

## 2019-04-12 DIAGNOSIS — M109 Gout, unspecified: Secondary | ICD-10-CM | POA: Diagnosis present

## 2019-04-12 DIAGNOSIS — G4730 Sleep apnea, unspecified: Secondary | ICD-10-CM | POA: Diagnosis present

## 2019-04-12 DIAGNOSIS — Z8551 Personal history of malignant neoplasm of bladder: Secondary | ICD-10-CM | POA: Diagnosis not present

## 2019-04-12 DIAGNOSIS — M170 Bilateral primary osteoarthritis of knee: Secondary | ICD-10-CM | POA: Diagnosis present

## 2019-04-12 DIAGNOSIS — R296 Repeated falls: Secondary | ICD-10-CM | POA: Diagnosis present

## 2019-04-12 DIAGNOSIS — Z882 Allergy status to sulfonamides status: Secondary | ICD-10-CM | POA: Diagnosis not present

## 2019-04-12 DIAGNOSIS — N179 Acute kidney failure, unspecified: Secondary | ICD-10-CM | POA: Diagnosis present

## 2019-04-12 DIAGNOSIS — R4182 Altered mental status, unspecified: Secondary | ICD-10-CM | POA: Diagnosis present

## 2019-04-12 DIAGNOSIS — I10 Essential (primary) hypertension: Secondary | ICD-10-CM | POA: Diagnosis present

## 2019-04-12 DIAGNOSIS — Z79899 Other long term (current) drug therapy: Secondary | ICD-10-CM

## 2019-04-12 DIAGNOSIS — R531 Weakness: Secondary | ICD-10-CM

## 2019-04-12 DIAGNOSIS — Z809 Family history of malignant neoplasm, unspecified: Secondary | ICD-10-CM

## 2019-04-12 LAB — ABO/RH: ABO/RH(D): O POS

## 2019-04-12 LAB — CBC WITH DIFFERENTIAL/PLATELET
Abs Immature Granulocytes: 0.04 10*3/uL (ref 0.00–0.07)
Basophils Absolute: 0 10*3/uL (ref 0.0–0.1)
Basophils Relative: 0 %
Eosinophils Absolute: 0 10*3/uL (ref 0.0–0.5)
Eosinophils Relative: 0 %
HCT: 37.1 % — ABNORMAL LOW (ref 39.0–52.0)
Hemoglobin: 13.2 g/dL (ref 13.0–17.0)
Immature Granulocytes: 0 %
Lymphocytes Relative: 7 %
Lymphs Abs: 0.7 10*3/uL (ref 0.7–4.0)
MCH: 32.1 pg (ref 26.0–34.0)
MCHC: 35.6 g/dL (ref 30.0–36.0)
MCV: 90.3 fL (ref 80.0–100.0)
Monocytes Absolute: 0.3 10*3/uL (ref 0.1–1.0)
Monocytes Relative: 3 %
Neutro Abs: 8.3 10*3/uL — ABNORMAL HIGH (ref 1.7–7.7)
Neutrophils Relative %: 90 %
Platelets: 186 10*3/uL (ref 150–400)
RBC: 4.11 MIL/uL — ABNORMAL LOW (ref 4.22–5.81)
RDW: 11.6 % (ref 11.5–15.5)
WBC: 9.4 10*3/uL (ref 4.0–10.5)
nRBC: 0 % (ref 0.0–0.2)

## 2019-04-12 LAB — COMPREHENSIVE METABOLIC PANEL
ALT: 27 U/L (ref 0–44)
AST: 21 U/L (ref 15–41)
Albumin: 3 g/dL — ABNORMAL LOW (ref 3.5–5.0)
Alkaline Phosphatase: 49 U/L (ref 38–126)
Anion gap: 10 (ref 5–15)
BUN: 36 mg/dL — ABNORMAL HIGH (ref 8–23)
CO2: 21 mmol/L — ABNORMAL LOW (ref 22–32)
Calcium: 9.6 mg/dL (ref 8.9–10.3)
Chloride: 107 mmol/L (ref 98–111)
Creatinine, Ser: 0.83 mg/dL (ref 0.61–1.24)
GFR calc Af Amer: >60 mL/min (ref >60)
GFR calc non Af Amer: >60 mL/min (ref >60)
Glucose, Bld: 405 mg/dL — ABNORMAL HIGH (ref 70–99)
Potassium: 4.3 mmol/L (ref 3.5–5.1)
Sodium: 138 mmol/L (ref 135–145)
Total Bilirubin: 0.7 mg/dL (ref 0.3–1.2)
Total Protein: 6.3 g/dL — ABNORMAL LOW (ref 6.5–8.1)

## 2019-04-12 LAB — HEMOGLOBIN A1C
Hgb A1c MFr Bld: 9.7 % — ABNORMAL HIGH (ref 4.8–5.6)
Mean Plasma Glucose: 231.69 mg/dL

## 2019-04-12 LAB — D-DIMER, QUANTITATIVE: D-Dimer, Quant: 0.53 ug/mL-FEU — ABNORMAL HIGH (ref 0.00–0.50)

## 2019-04-12 LAB — GLUCOSE, RANDOM: Glucose, Bld: 436 mg/dL — ABNORMAL HIGH (ref 70–99)

## 2019-04-12 LAB — C-REACTIVE PROTEIN: CRP: 3.3 mg/dL — ABNORMAL HIGH (ref <1.0)

## 2019-04-12 LAB — HIV ANTIBODY (ROUTINE TESTING W REFLEX): HIV Screen 4th Generation wRfx: NONREACTIVE

## 2019-04-12 LAB — GLUCOSE, CAPILLARY: Glucose-Capillary: 434 mg/dL — ABNORMAL HIGH (ref 70–99)

## 2019-04-12 MED ORDER — INSULIN ASPART 100 UNIT/ML ~~LOC~~ SOLN
0.0000 [IU] | Freq: Three times a day (TID) | SUBCUTANEOUS | Status: DC
  Administered 2019-04-13: 15 [IU] via SUBCUTANEOUS
  Administered 2019-04-13: 11 [IU] via SUBCUTANEOUS

## 2019-04-12 MED ORDER — ENOXAPARIN SODIUM 60 MG/0.6ML ~~LOC~~ SOLN
50.0000 mg | SUBCUTANEOUS | Status: DC
  Administered 2019-04-12 – 2019-04-16 (×4): 50 mg via SUBCUTANEOUS
  Filled 2019-04-12 (×4): qty 0.6

## 2019-04-12 MED ORDER — SODIUM CHLORIDE 0.9 % IV SOLN: INTRAVENOUS | Status: DC

## 2019-04-12 MED ORDER — TAMSULOSIN HCL 0.4 MG PO CAPS
0.4000 mg | ORAL_CAPSULE | Freq: Two times a day (BID) | ORAL | Status: DC
  Administered 2019-04-12 – 2019-04-16 (×9): 0.4 mg via ORAL
  Filled 2019-04-12 (×9): qty 1

## 2019-04-12 MED ORDER — SODIUM CHLORIDE 0.9 % IV SOLN
100.0000 mg | Freq: Every day | INTRAVENOUS | Status: AC
  Administered 2019-04-13 – 2019-04-14 (×2): 100 mg via INTRAVENOUS
  Filled 2019-04-12 (×2): qty 100

## 2019-04-12 MED ORDER — MAGNESIUM OXIDE 400 (241.3 MG) MG PO TABS
400.0000 mg | ORAL_TABLET | Freq: Two times a day (BID) | ORAL | Status: DC
  Administered 2019-04-12 – 2019-04-16 (×9): 400 mg via ORAL
  Filled 2019-04-12 (×9): qty 1

## 2019-04-12 MED ORDER — PANTOPRAZOLE SODIUM 40 MG PO TBEC
40.0000 mg | DELAYED_RELEASE_TABLET | Freq: Every day | ORAL | Status: DC
  Administered 2019-04-13 – 2019-04-16 (×4): 40 mg via ORAL
  Filled 2019-04-12 (×5): qty 1

## 2019-04-12 MED ORDER — ACETAMINOPHEN 500 MG PO TABS
1000.0000 mg | ORAL_TABLET | Freq: Every day | ORAL | Status: DC
  Administered 2019-04-12 – 2019-04-16 (×5): 1000 mg via ORAL
  Filled 2019-04-12 (×5): qty 2

## 2019-04-12 MED ORDER — POLYETHYLENE GLYCOL 3350 17 G PO PACK: 17.0000 g | PACK | Freq: Every day | ORAL | Status: DC | PRN

## 2019-04-12 MED ORDER — ATORVASTATIN CALCIUM 10 MG PO TABS
10.0000 mg | ORAL_TABLET | Freq: Every day | ORAL | Status: DC
  Administered 2019-04-12 – 2019-04-16 (×5): 10 mg via ORAL
  Filled 2019-04-12 (×5): qty 1

## 2019-04-12 MED ORDER — INSULIN ASPART 100 UNIT/ML ~~LOC~~ SOLN
0.0000 [IU] | Freq: Every day | SUBCUTANEOUS | Status: DC
  Administered 2019-04-12: 5 [IU] via SUBCUTANEOUS

## 2019-04-12 MED ORDER — DIPHENHYDRAMINE-APAP (SLEEP) 25-500 MG PO TABS: 2.0000 | ORAL_TABLET | Freq: Every day | ORAL | Status: DC

## 2019-04-12 MED ORDER — CHELATED MAGNESIUM 100 MG PO TABS: 400.0000 mg | ORAL_TABLET | Freq: Two times a day (BID) | ORAL | Status: DC

## 2019-04-12 MED ORDER — HYDROCHLOROTHIAZIDE 12.5 MG PO CAPS
25.0000 mg | ORAL_CAPSULE | Freq: Every day | ORAL | Status: DC
  Administered 2019-04-12 – 2019-04-14 (×3): 25 mg via ORAL
  Filled 2019-04-12 (×3): qty 2

## 2019-04-12 MED ORDER — ACETAMINOPHEN 325 MG PO TABS: 325.0000 mg | ORAL_TABLET | ORAL | Status: DC | PRN

## 2019-04-12 MED ORDER — IRBESARTAN 75 MG PO TABS
75.0000 mg | ORAL_TABLET | Freq: Every day | ORAL | Status: DC
  Administered 2019-04-13 – 2019-04-16 (×4): 75 mg via ORAL
  Filled 2019-04-12 (×6): qty 1

## 2019-04-12 MED ORDER — METOPROLOL SUCCINATE ER 100 MG PO TB24
100.0000 mg | ORAL_TABLET | Freq: Every day | ORAL | Status: DC
  Administered 2019-04-12 – 2019-04-16 (×5): 100 mg via ORAL
  Filled 2019-04-12 (×5): qty 1

## 2019-04-12 MED ORDER — AMLODIPINE BESYLATE 5 MG PO TABS
5.0000 mg | ORAL_TABLET | Freq: Every day | ORAL | Status: DC
  Administered 2019-04-12 – 2019-04-16 (×5): 5 mg via ORAL
  Filled 2019-04-12 (×5): qty 1

## 2019-04-12 MED ORDER — VITAMIN C 500 MG PO TABS
500.0000 mg | ORAL_TABLET | Freq: Every day | ORAL | Status: DC
  Administered 2019-04-12 – 2019-04-16 (×5): 500 mg via ORAL
  Filled 2019-04-12 (×5): qty 1

## 2019-04-12 MED ORDER — DIPHENHYDRAMINE HCL 25 MG PO CAPS
50.0000 mg | ORAL_CAPSULE | Freq: Every day | ORAL | Status: DC
  Administered 2019-04-12 – 2019-04-16 (×5): 50 mg via ORAL
  Filled 2019-04-12 (×5): qty 2

## 2019-04-12 MED ORDER — ALLOPURINOL 100 MG PO TABS
100.0000 mg | ORAL_TABLET | Freq: Every day | ORAL | Status: DC
  Administered 2019-04-13 – 2019-04-16 (×4): 100 mg via ORAL
  Filled 2019-04-12 (×4): qty 1

## 2019-04-12 NOTE — H&P (Signed)
History and Physical  William [name] [id number] DOB: October 27, 1950 DOA: 04/12/2019  PCP: Mateo Flow, MD Patient coming from: [facility]  I have personally briefly reviewed patient's old medical records in [place]   Chief Complaint: Altered mental status   HPI: William [name] is a 68 y.o. male past medical history of CAD diabetes high cholesterol who was brought into the ED at [facility] for fall started 3 days prior to admission. He had a work-up and no cause was found and he was discharged home he came back a second time where they did imaging and a COVID-19 test (on 04/07/2019 SARS-CoV-2 PCR was positive) which was positive and was discharged home he came in the third time, according to the ED notes patient is seen slightly confused according to his girlfriend so she called EMS who picked him up and brought him into the ED, there was no neurological deficits and according to the previous visits to the ED had a had difficulty ambulating due to generalized weakness. He was found on the floor by family members and was brought it confused was found to be febrile, but not hypoxic.  Head CT at [facility] was unremarkable CT scan showed bilateral multifocal pneumonia he was started on IV fluids and mentation was improving.  Labs at [place] showed a white count of 9.3 hemoglobin of 13 count of 168, sodium is 134 potassium 4.5, chloride 105 bicarb 24 BUN of 38 creatinine of 0.7 (on 04/07/2019 his creatinine was 1.1), LFTs were within normal limits.   Review of Systems: All systems reviewed and apart from history of presenting illness, are negative.  Past Medical History:  Diagnosis Date  . Arthritis    OA- knees   . Bell's palsy 2014  . Bladder cancer ([place])   . BPH (benign prostatic hyperplasia)   . GERD (gastroesophageal reflux disease)   . Gout   . Hypertension   . Sleep apnea    CPAP-in use q night, last study 5 yrs. ago  . Spinal stenosis of lumbar region with  radiculopathy    Past Surgical History:  Procedure Laterality Date  . BLADDER TUMOR EXCISION  2017   several cystocscopy for the excision followed by M. Annitta Needs  . BLEPHAROPLASTY Bilateral 2017   in [place]  . CYSTOSCOPY    . HEMATOMA EVACUATION N/A 07/04/2018   Procedure: Lumbar wound exploration and EVACUATION OF EPIDURAL HEMATOMA;  Surgeon: Newman Pies, MD;  Location: [facility];  Service: Neurosurgery;  Laterality: N/A;  . JOINT REPLACEMENT    . KNEE ARTHROSCOPY     2 on each side, one being a repair of the meniscus     . LUMBAR LAMINECTOMY/DECOMPRESSION MICRODISCECTOMY N/A 02/24/2018   Procedure: Lumbar Four-Five Laminectomy/Foraminotomy;  Surgeon: Ashok Pall, MD;  Location: [facility];  Service: Neurosurgery;  Laterality: N/A;  . SHOULDER ARTHROSCOPY Right 2016  . TONSILLECTOMY    . TOTAL KNEE ARTHROPLASTY Right 12/08/2016   Procedure: TOTAL KNEE ARTHROPLASTY;  Surgeon: Vickey Huger, MD;  Location: [facility];  Service: Orthopedics;  Laterality: Right;   Social History:  reports that he quit smoking about 16 years ago. He has never used smokeless tobacco. He reports current alcohol use of about 2.0 - 3.0 standard drinks of alcohol per week. He reports that he does not use drugs.   Allergies  Allergen Reactions  . Sulfa Antibiotics Other (See Comments)    UNSPECIFIED REACTION   . Sulfasalazine Dermatitis    UNSPECIFIED  SEVERITY REACTION     Family History  Problem Relation Age of Onset  . Cancer Mother   . Congestive Heart Failure Father     Prior to Admission medications   Medication Sig Start Date End Date Taking? Authorizing Provider  acetaminophen (TYLENOL) 325 MG tablet Take 1-2 tablets (325-650 mg total) by mouth every 4 (four) hours as needed for mild pain. 07/21/18   Love, Ivan Anchors, PA-C  allopurinol (ZYLOPRIM) 100 MG tablet Take 100 mg by mouth daily.    [provider]  amLODipine (NORVASC) 5 MG tablet Take 5 mg by mouth daily.  08/25/16   [provider]  amoxicillin (AMOXIL) 500 MG capsule Take 2,000 mg by mouth See admin instructions. Before Dental procedures 11/01/18   [provider]  atorvastatin (LIPITOR) 10 MG tablet Take 10 mg by mouth daily. 10/19/16   [provider]  Chelated Magnesium 100 MG TABS Take 400 mg by mouth 2 (two) times daily.    [provider]  diphenhydramine-acetaminophen (TYLENOL PM) 25-500 MG TABS tablet Take 2 tablets by mouth at bedtime.    [provider]  Flaxseed, Linseed, (FLAXSEED OIL PO) Take 2,000 mg by mouth 2 (two) times daily.     [provider]  hydrochlorothiazide (MICROZIDE) 12.5 MG capsule Take 25 mg by mouth daily.  10/14/18   [provider]  loratadine (CLARITIN) 10 MG tablet Take 10 mg by mouth daily.    [provider]  meloxicam (MOBIC) 15 MG tablet Take 15 mg by mouth daily as needed for pain.  11/03/18   [provider]  metoprolol succinate (TOPROL-XL) 100 MG 24 hr tablet Take 100 mg by mouth daily. 09/27/16   [provider]  Multiple Vitamin (MULTIVITAMIN WITH MINERALS) TABS tablet Take 1 tablet by mouth daily.    [provider]  Omega-3 Fatty Acids (FISH OIL) 1200 MG CAPS Take 2,400 mg by mouth 2 (two) times daily.     [provider]  pantoprazole (PROTONIX) 40 MG tablet Take 1 tablet (40 mg total) by mouth daily before breakfast. 07/22/18   Love, Ivan Anchors, PA-C  polyvinyl alcohol (LIQUIFILM TEARS) 1.4 % ophthalmic solution Place 2 drops into both eyes 3 (three) times daily as needed (for dry eyes). 07/21/18   Love, Ivan Anchors, PA-C  tamsulosin (FLOMAX) 0.4 MG CAPS capsule Take 0.4 mg by mouth 2 (two) times daily. 08/25/16   [provider]  telmisartan (MICARDIS) 40 MG tablet Take 40 mg by mouth daily. for high blood pressure 11/10/18   [provider]  Turmeric Curcumin 500 MG CAPS Take 500 mg by mouth 2 (two) times daily.    [provider]  vitamin C (ASCORBIC  ACID) 500 MG tablet Take 500 mg by mouth daily.    [provider]   Physical Exam: Vitals:   04/12/19 1603  BP: (!) 172/78  Pulse: 69  Resp: (!) 22  Temp: 97.9 F (36.6 C)  TempSrc: Oral  Weight: 98.4 kg  Height: 5\' 11"  (1.803 m)     General exam: Moderately built and nourished patient, lying comfortably supine on the gurney in no obvious distress.  Head, eyes and ENT: Nontraumatic and normocephalic. Pupils equally reacting to light and accommodation. Oral mucosa moist.  Neck: Supple. No JVD, carotid bruit or thyromegaly.  Lymphatics: No lymphadenopathy.  Respiratory system: Good air movement and clear to auscultation.  Cardiovascular system: S1 and S2 heard, RRR. No JVD, murmurs, gallops, clicks or pedal  edema.  Gastrointestinal system: Abdomen is nondistended, soft and nontender. Normal bowel sounds heard. No organomegaly or masses appreciated.  Central nervous system: Alert and oriented. No focal neurological deficits.  Extremities: Symmetric 5 x 5 power. Peripheral pulses symmetrically felt.   Skin: No rashes or acute findings.  Musculoskeletal system: Negative exam.  Psychiatry: Pleasant and cooperative.   Labs on Admission:  Basic Metabolic Panel: No results for input(s): NA, K, CL, CO2, GLUCOSE, BUN, CREATININE, CALCIUM, MG, PHOS in the last 168 hours. Liver Function Tests: No results for input(s): AST, ALT, ALKPHOS, BILITOT, PROT, ALBUMIN in the last 168 hours. No results for input(s): LIPASE, AMYLASE in the last 168 hours. No results for input(s): AMMONIA in the last 168 hours. CBC: No results for input(s): WBC, NEUTROABS, HGB, HCT, MCV, PLT in the last 168 hours. Cardiac Enzymes: No results for input(s): CKTOTAL, CKMB, CKMBINDEX, TROPONINI in the last 168 hours.  BNP (last 3 results) No results for input(s): PROBNP in the last 8760 hours. CBG: No results for input(s): GLUCAP in the last 168 hours.  Radiological Exams on Admission: No  results found.  EKG: Independently reviewed.   Assessment/Plan Acute metabolic encephalopathy: Likely due to dehydration in the setting of infectious etiology. He was started on IV fluids at [facility] and his mentation when I evaluated seems at baseline. He has no complaints except for a little bit of a headache. He said he has had trouble with his back and his knees but he does not feel like he was lightheaded. Despite this I will go ahead and check orthostatics start on gentle IV fluids recheck a basic metabolic now and repeat in the morning.  Generalized weakness due to COVID-19:  According to the ED notes patient is seen slightly confused according to his girlfriend she also noticed him confused on the floor at home, so she called EMS who picked him up and brought him into the ED, there was no neurological deficits and according to the previous visits to the ED had a had difficulty ambulating due to generalized weakness. His PCR was positive at [facility] [place] he was started over there on IV remdesivir, he received his third dose of IV remdesivir today will complete his course. He is not hypoxic we will not start steroids at this point. We will consult PT OT.  Diabetes mellitus type 2 in nonobese [facility]) Start him on sliding scale insulin. He is on no oral hypoglycemic agents at home.   DVT Prophylaxis: lovenox Code Status: full  Family Communication: none  Disposition Plan: once he completes his remdesivir treatment   Time spent: 70 min    It is my clinical opinion that admission to [place] is reasonable and necessary in this 68 y.o. male patient is transferred from [facility] with encephalopathy and unstable gait, with a new COVID-19 infection he was febrile at [facility] afebrile at [facility]. We will complete his course of IV remdesivir get physical therapy consult to evaluate his gait. .   Given the aforementioned, the predictability of an adverse outcome is felt to  be significant. I expect that the patient will require at least 2 midnights in the hospital to treat this condition.  Charlynne Cousins MD [facility]   04/12/2019, 4:28 PM

## 2019-04-12 NOTE — Progress Notes (Signed)
Pt was tx here from [facility] for [place]. The pharmacy staff there just let us know that he has received 3 doses of remdesivir there including today. [place] with Dr. Aileen Fass to put in the order to finish out the course.  Remdesivir 100mg  IV q24 x2 F/u ALT  Onnie Boer, PharmD, BCIDP, AAHIVP, CPP Infectious Disease Pharmacist 04/12/2019 4:21 PM

## 2019-04-13 DIAGNOSIS — N179 Acute kidney failure, unspecified: Secondary | ICD-10-CM

## 2019-04-13 LAB — URINALYSIS, ROUTINE W REFLEX MICROSCOPIC
Bacteria, UA: NONE SEEN
Bilirubin Urine: NEGATIVE
Glucose, UA: >=500 mg/dL — AB
Ketones, ur: NEGATIVE mg/dL
Leukocytes,Ua: NEGATIVE
Nitrite: NEGATIVE
Protein, ur: NEGATIVE mg/dL
Specific Gravity, Urine: 1.022 (ref 1.005–1.030)
pH: 6 (ref 5.0–8.0)

## 2019-04-13 LAB — COMPREHENSIVE METABOLIC PANEL
ALT: 28 U/L (ref 0–44)
AST: 18 U/L (ref 15–41)
Albumin: 3.2 g/dL — ABNORMAL LOW (ref 3.5–5.0)
Alkaline Phosphatase: 51 U/L (ref 38–126)
Anion gap: 10 (ref 5–15)
BUN: 37 mg/dL — ABNORMAL HIGH (ref 8–23)
CO2: 22 mmol/L (ref 22–32)
Calcium: 9.8 mg/dL (ref 8.9–10.3)
Chloride: 106 mmol/L (ref 98–111)
Creatinine, Ser: 0.82 mg/dL (ref 0.61–1.24)
GFR calc Af Amer: >60 mL/min (ref >60)
GFR calc non Af Amer: >60 mL/min (ref >60)
Glucose, Bld: 378 mg/dL — ABNORMAL HIGH (ref 70–99)
Potassium: 4 mmol/L (ref 3.5–5.1)
Sodium: 138 mmol/L (ref 135–145)
Total Bilirubin: 0.7 mg/dL (ref 0.3–1.2)
Total Protein: 6.3 g/dL — ABNORMAL LOW (ref 6.5–8.1)

## 2019-04-13 LAB — CBC WITH DIFFERENTIAL/PLATELET

## 2019-04-13 LAB — D-DIMER, QUANTITATIVE: D-Dimer, Quant: 0.27 ug/mL-FEU (ref 0.00–0.50)

## 2019-04-13 LAB — C-REACTIVE PROTEIN: CRP: 2.5 mg/dL — ABNORMAL HIGH

## 2019-04-13 LAB — GLUCOSE, CAPILLARY
Comment 1: NEGATIVE
Glucose-Capillary: 312 mg/dL — ABNORMAL HIGH (ref 70–99)
Glucose-Capillary: 331 mg/dL — ABNORMAL HIGH (ref 70–99)
Glucose-Capillary: 364 mg/dL — ABNORMAL HIGH (ref 70–99)
Glucose-Capillary: 392 mg/dL — ABNORMAL HIGH (ref 70–99)
Glucose-Capillary: 404 mg/dL — ABNORMAL HIGH (ref 70–99)
Glucose-Capillary: 443 mg/dL — ABNORMAL HIGH (ref 70–99)

## 2019-04-13 MED ORDER — INSULIN ASPART 100 UNIT/ML ~~LOC~~ SOLN
0.0000 [IU] | Freq: Three times a day (TID) | SUBCUTANEOUS | Status: DC
  Administered 2019-04-13: 20 [IU] via SUBCUTANEOUS
  Administered 2019-04-14: 11 [IU] via SUBCUTANEOUS
  Administered 2019-04-14: 15 [IU] via SUBCUTANEOUS
  Administered 2019-04-14: 3 [IU] via SUBCUTANEOUS
  Administered 2019-04-15: 7 [IU] via SUBCUTANEOUS
  Administered 2019-04-15: 3 [IU] via SUBCUTANEOUS
  Administered 2019-04-15 – 2019-04-16 (×3): 7 [IU] via SUBCUTANEOUS

## 2019-04-13 MED ORDER — ENSURE MAX PROTEIN PO LIQD
11.0000 [oz_av] | Freq: Two times a day (BID) | ORAL | Status: DC
  Administered 2019-04-13 – 2019-04-16 (×7): 11 [oz_av] via ORAL
  Filled 2019-04-13 (×9): qty 330

## 2019-04-13 MED ORDER — INSULIN DETEMIR 100 UNIT/ML ~~LOC~~ SOLN
30.0000 [IU] | Freq: Two times a day (BID) | SUBCUTANEOUS | Status: DC
  Administered 2019-04-13 – 2019-04-15 (×4): 30 [IU] via SUBCUTANEOUS
  Filled 2019-04-13 (×5): qty 0.3

## 2019-04-13 MED ORDER — INSULIN ASPART 100 UNIT/ML ~~LOC~~ SOLN
0.0000 [IU] | Freq: Every day | SUBCUTANEOUS | Status: DC
  Administered 2019-04-13: 4 [IU] via SUBCUTANEOUS
  Administered 2019-04-15: 2 [IU] via SUBCUTANEOUS

## 2019-04-13 NOTE — Plan of Care (Signed)

## 2019-04-13 NOTE — Progress Notes (Signed)
Initial Nutrition Assessment RD working remotely.  DOCUMENTATION CODES:   Obesity unspecified  INTERVENTION:    Continue regular diet.  Ensure Max po BID, each supplement provides 150 kcal and 30 grams of protein.   NUTRITION DIAGNOSIS:   Increased nutrient needs related to acute illness(COVID-19) as evidenced by estimated needs.  GOAL:   Patient will meet greater than or equal to 90% of their needs  MONITOR:   PO intake, Supplement acceptance, Labs  REASON FOR ASSESSMENT:   Malnutrition Screening Tool    ASSESSMENT:   68 yo male admitted with AMS, COVID-19 positive 11/19. PMH includes HTN, GERD, sleep apnea, Bell's palsy 2014, bladder cancer, lumbar spinal stenosis.   On admission, patient reported poor intake with minimal weight loss PTA. From review of usual weights, he has lost 1% of usual weight within the past 2 months, not significant for the time frame. Patient consumed 100% of breakfast today.   Patient would benefit from PO supplements to ensure adequate protein intake to meet increased nutrition needs for COVID-19.  Labs reviewed.  CBG's: [id number]  Medications reviewed and include HCTZ, novolog, levemir, mag-ox, flomax, vitamin C.    NUTRITION - FOCUSED PHYSICAL EXAM:  deferred  Diet Order:   Diet Order            Diet regular Room service appropriate? Yes; Fluid consistency: Thin  Diet effective now              EDUCATION NEEDS:   Not appropriate for education at this time  Skin:  Skin Assessment: Reviewed RN Assessment  Last BM:  11/24  Height:   Ht Readings from Last 1 Encounters:  04/12/19 5\' 11"  (1.803 m)    Weight:   Wt Readings from Last 1 Encounters:  04/12/19 98.4 kg    Ideal Body Weight:  78.2 kg  BMI:  Body mass index is 30.26 kg/m.  Estimated Nutritional Needs:   Kcal:  2000-2300  Protein:  115-130 gm  Fluid:  >/= 2 L    Molli Barrows, RD, LDN, Ama Pager [phone] After Hours Pager [phone]

## 2019-04-13 NOTE — Progress Notes (Signed)
   04/13/19 1615  What Happened  Was fall witnessed? No  Was patient injured? Unsure  Patient found on floor  Found by Staff-comment (Brandi Darnold)  Stated prior activity other (comment) (laying in bed)  Follow Up  MD notified Ghimire  Time MD notified 1620  Simple treatment  (none)  Adult Fall Risk Assessment  Risk Factor Category (scoring not indicated) High fall risk per protocol (document High fall risk);History of more than one fall within 6 months before admission (document High fall risk);Fall has occurred during this admission (document High fall risk)  Patient Fall Risk Level High fall risk  Adult Fall Risk Interventions  Required Bundle Interventions *See Row Information* High fall risk - low, moderate, and high requirements implemented  Screening for Fall Injury Risk (To be completed on HIGH fall risk patients) - Assessing Need for Low Bed  Risk For Fall Injury- Low Bed Criteria Previous fall this admission  Will Implement Low Bed and Floor Mats Yes  Screening for Fall Injury Risk (To be completed on HIGH fall risk patients who do not meet crieteria for Low Bed) - Assessing Need for Floor Mats Only  Risk For Fall Injury- Criteria for Floor Mats Noncompliant with safety precautions  Will Implement Floor Mats Yes  Vitals  Temp 97.9 F (36.6 C)  Temp Source Oral  BP (!) 182/83  BP Location Left Arm  BP Method Automatic  Patient Position (if appropriate) Lying  Pulse Rate 75  Pulse Rate Source Monitor  Resp 18  Oxygen Therapy  SpO2 100 %  O2 Device Room Air  Pain Assessment  Pain Scale 0-10  Pain Score 0  PCA/Epidural/Spinal Assessment  Respiratory Pattern Regular;Unlabored  Neurological  Neuro (WDL) X  Level of Consciousness Alert  Orientation Level Oriented X4  Cognition Poor judgement;Poor safety awareness;Poor attention/concentration  Speech Clear  Pupil Assessment  Yes  R Pupil Size (mm) 3  R Pupil Shape Round  R Pupil Reaction Brisk  L Pupil Size  (mm) 3  L Pupil Shape Round  L Pupil Reaction Brisk  Additional Pupil Assessments No  Motor Function/Sensation Assessment Grip;Motor response  R Hand Grip Strong  L Hand Grip Strong  RUE Motor Response Purposeful movement  LUE Motor Response Purposeful movement  RLE Motor Response Purposeful movement  LLE Motor Response Purposeful movement  Neuro Symptoms Forgetful  Musculoskeletal  Musculoskeletal (WDL) X  Assistive Device Front wheel walker  Generalized Weakness Yes  Weight Bearing Restrictions No  Musculoskeletal Details  RLE Weakness  LLE Weakness  Integumentary  Integumentary (WDL) X  Skin Color Appropriate for ethnicity  Skin Condition Dry  Skin Integrity Abrasion  Abrasion Location Knee;Leg   Nurse tech states patient on floor. Unwitnessed fall.  Writer in to assess, primary RN on break. He states that he did not hit his head and that nothing is hurting however I'm not fully certain he is  fully oriented. He answers all orientation questions correctly but there is a small cut on the top of his head that I am not aware if he had previously. He is a little hypertensive BP is 168/79. 94% on room air. MD paged. Awaiting response  1630: primary RN returned. Aware of fall and awaiting return call from MD.

## 2019-04-13 NOTE — Progress Notes (Addendum)
Inpatient Diabetes Program Recommendations  AACE/ADA: New Consensus Statement on Inpatient Glycemic Control (2015)  Target Ranges:  Prepandial:   less than 140 mg/dL      Peak postprandial:   less than 180 mg/dL (1-2 hours)      Critically ill patients:  140 - 180 mg/dL   Lab Results  Component Value Date   GLUCAP 392 (H) 04/13/2019   HGBA1C 9.7 (H) 04/12/2019   Results for William [name] "[name]" (MRN [id number]) as of 04/13/2019 12:25  Ref. Range 04/12/2019 20:29 04/12/2019 22:57 04/13/2019 00:15 04/13/2019 08:11 04/13/2019 11:43  Glucose-Capillary Latest Ref Range: 70 - 99 mg/dL 434 (H) 404 (H) 364 (H) 312 (H) 392 (H)   Review of Glycemic Control  Diabetes history: Type 2 Outpatient Diabetes medications: Metformin XR 500 mg daily Current orders for Inpatient glycemic control: Levemir 30 units BID, Novolog MODERATE correction scale TID & HS.  Inpatient Diabetes Program Recommendations:   Noted that patient's blood sugars are in the 300's and 400's.  Recommend increasing Levemir to 35 units BID and increasing Novolog correction scale to RESISTANT (0-20 units) TID & HS scale. Titrate dosages as needed. May need to consider Novolog meal coverage TID if postprandial blood sugars continue to be elevated.   Also, noted that patient is on a REGULAR diet.  Recommend CHO Modified Medium diet.   Will continue to monitor blood sugars while in the hospital.  Harvel Ricks RN BSN CDE Diabetes Coordinator Pager: [phone]  8am-5pm

## 2019-04-13 NOTE — Progress Notes (Signed)
Blood sugar 443, team aware. Diabetes coordinator has readjusted insulin.

## 2019-04-13 NOTE — Progress Notes (Signed)
Dr. Sloan Leiter called RN back, all questions answered. Will continue to monitor patient for any changes.

## 2019-04-13 NOTE — Progress Notes (Addendum)
PROGRESS NOTE                                                                                                                                                                                                             Patient Demographics:    William [name], is a 68 y.o. male, DOB - October 29, 1950, [id number]  Outpatient Primary MD for the patient is Mateo Flow, MD   Admit date - 04/12/2019   LOS - 1  No chief complaint on file.      Brief Narrative: Patient is a 68 y.o. 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   Subjective:    William [name] today is completely awake and alert-he feels better.  He is not on any supplemental oxygen.   Assessment  & Plan :    Covid 19 Viral pneumonia: Improved-continue remdesivir.  Fever: afebrile  O2 requirements: On RA SpO2: 93 %   COVID-19 Labs: Recent Labs    04/12/19 1720 04/13/19 0150  DDIMER 0.53* 0.27  CRP 3.3* 2.5*    No results found for: SARSCOV2NAA   COVID-19 Medications: Steroids: Not on steroids-due to lack of hypoxemia Remdesivir: 11/22 Actemra: Not given Convalescent Plasma: Not given  Other medications: Diuretics:Euvolemic-no need for lasix Antibiotics:Not needed as no evidence of bacterial infection  Prone/Incentive Spirometry: encouraged  incentive spirometry use 3-4/hour.  DVT Prophylaxis  :  Lovenox   Acute metabolic encephalopathy: Suspect from COVID-19 pneumonia-much better.  [facility] head at [facility] did not show any acute abnormalities.  Frequent falls: I suspect this is secondary to deconditioning from COVID-19-obtain PT/OT.  Patient's girlfriend tells me that he has been following with neurosurgery-and apparently is getting scheduled for another lower back/lumbar  surgery.  CT head done at [facility]-read as chronic ventriculomegaly-since chronic-doubt further work-up is required inpatient unless unsteady gait persists.  Await eval by PT/OT.  DM-2: Uncontrolled-patient has not received Levemir dosing yet-for now continue with Levemir 30 units twice daily, changed to SSI resistance scale-follow-we will adjust accordingly.  CBG (last 3)  Recent Labs    04/12/19 2029 04/13/19 0015  GLUCAP 434* 364*   HTN: Controlled-continue amlodipine, HCTZ, Avapro and metoprolol  Dyslipidemia: Continue statin  BPH: Continue  Flomax  Gout: No evidence of flare-continue allopurinol  Right lower lobe 5 mm pulmonary nodule: Repeat CT chest in 6-12 months  Obesity: Estimated body mass index is 30.26 kg/m as calculated from the following:   Height as of this encounter: 5\' 11"  (1.803 m).   Weight as of this encounter: 98.4 kg.    Consults  :  None  Procedures  :  None  ABG: No results found for: PHART, PCO2ART, PO2ART, HCO3, TCO2, ACIDBASEDEF, O2SAT  Vent Settings: N/A  Condition - Stable  Family Communication  : Spoke with patient's girlfriend over the phone  Code Status :  Full Code  Diet :  Diet Order            Diet regular Room service appropriate? Yes; Fluid consistency: Thin  Diet effective now               Disposition Plan  :  Remain hospitalized  Barriers to discharge: complete 5 days of IV Remdesivir  Antimicorbials  :    Anti-infectives (From admission, onward)   Start     Dose/Rate Route Frequency Ordered Stop   04/13/19 1000  remdesivir 100 mg in sodium chloride 0.9 % 250 mL IVPB     100 mg 500 mL/hr over 30 Minutes Intravenous Daily 04/12/19 1619 04/15/19 0959      Inpatient Medications  Scheduled Meds: . diphenhydrAMINE  50 mg Oral QHS   And  . acetaminophen  1,000 mg Oral QHS  . allopurinol  100 mg Oral Daily  . amLODipine  5 mg Oral Daily  . atorvastatin  10 mg Oral Daily  . enoxaparin (LOVENOX) injection  50  mg Subcutaneous Q24H  . hydrochlorothiazide  25 mg Oral Daily  . insulin aspart  0-15 Units Subcutaneous TID WC  . insulin aspart  0-5 Units Subcutaneous QHS  . insulin detemir  30 Units Subcutaneous BID  . irbesartan  75 mg Oral Daily  . magnesium oxide  400 mg Oral BID  . metoprolol succinate  100 mg Oral Daily  . pantoprazole  40 mg Oral QAC breakfast  . tamsulosin  0.4 mg Oral BID  . vitamin C  500 mg Oral Daily   Continuous Infusions: . remdesivir 100 mg in NS 250 mL     PRN Meds:.acetaminophen, polyethylene glycol  Time Spent in minutes  25  See all Orders from today for further details  Oren Binet M.D on 04/13/2019 at 7:34 AM  To page go to [email or web address] - use universal password  [facility] -  Office  [phone]    Objective:   Vitals:   04/12/19 1700 04/12/19 2044 04/13/19 0247 04/13/19 0423  BP: (!) 171/79 (!) 161/93  (!) 161/80  Pulse: 68 65 (!) 53 65  Resp:  20 20 20   Temp:  97.8 F (36.6 C)  97.8 F (36.6 C)  TempSrc:  Oral  Oral  SpO2: 94% 96% 97% 93%  Weight:      Height:        Wt Readings from Last 3 Encounters:  04/12/19 98.4 kg  02/18/19 99.3 kg  08/26/18 93.5 kg    No intake or output data in the 24 hours ending 04/13/19 0734  Physical Exam Gen Exam:Alert awake-not in any distress HEENT:atraumatic, normocephalic Chest: B/L clear to auscultation anteriorly CVS:S1S2 regular Abdomen:soft non tender, non distended Extremities:no edema Neurology: Moves all 4 extremities. Skin: no rash   Data Review:   CBC Recent Labs  Lab 04/12/19 1720 04/13/19  0150  WBC 9.4 9.2  HGB 13.2 13.3  HCT 37.1* 37.9*  PLT 186 187  MCV 90.3 88.1  MCH 32.1 30.9  MCHC 35.6 35.1  RDW 11.6 11.4*  LYMPHSABS 0.7 0.8  MONOABS 0.3 0.4  EOSABS 0.0 0.0  BASOSABS 0.0 0.0    Chemistries  Recent Labs  Lab 04/12/19 1720 04/12/19 2140 04/13/19 0150  NA 138  --  138  K 4.3  --  4.0  CL 107  --  106  CO2 21*  --  22  GLUCOSE 405* 436*  378*  BUN 36*  --  37*  CREATININE 0.83  --  0.82  CALCIUM 9.6  --  9.8  AST 21  --  18  ALT 27  --  28  ALKPHOS 49  --  51  BILITOT 0.7  --  0.7   ------------------------------------------------------------------------------------------------------------------ No results for input(s): CHOL, HDL, LDLCALC, TRIG, CHOLHDL, LDLDIRECT in the last 72 hours.  Lab Results  Component Value Date   HGBA1C 9.7 (H) 04/12/2019   ------------------------------------------------------------------------------------------------------------------ No results for input(s): TSH, T4TOTAL, T3FREE, THYROIDAB in the last 72 hours.  Invalid input(s): FREET3 ------------------------------------------------------------------------------------------------------------------ No results for input(s): VITAMINB12, FOLATE, FERRITIN, TIBC, IRON, RETICCTPCT in the last 72 hours.  Coagulation profile No results for input(s): INR, PROTIME in the last 168 hours.  Recent Labs    04/12/19 1720 04/13/19 0150  DDIMER 0.53* 0.27    Cardiac Enzymes No results for input(s): CKMB, TROPONINI, MYOGLOBIN in the last 168 hours.  Invalid input(s): CK ------------------------------------------------------------------------------------------------------------------ No results found for: BNP  Micro Results No results found for this or any previous visit (from the past 240 hour(s)).  Radiology Reports No results found.

## 2019-04-14 LAB — GLUCOSE, CAPILLARY
Glucose-Capillary: 149 mg/dL — ABNORMAL HIGH (ref 70–99)
Glucose-Capillary: 121 mg/dL — ABNORMAL HIGH (ref 70–99)
Glucose-Capillary: 329 mg/dL — ABNORMAL HIGH (ref 70–99)
Glucose-Capillary: 261 mg/dL — ABNORMAL HIGH (ref 70–99)
Glucose-Capillary: 62 mg/dL — ABNORMAL LOW (ref 70–99)
Glucose-Capillary: 93 mg/dL (ref 70–99)

## 2019-04-14 LAB — CBC WITH DIFFERENTIAL/PLATELET
Abs Immature Granulocytes: 0.08 10*3/uL — ABNORMAL HIGH (ref 0.00–0.07)
Basophils Absolute: 0 10*3/uL (ref 0.0–0.1)
Basophils Relative: 0 %
Eosinophils Absolute: 0 10*3/uL (ref 0.0–0.5)
Eosinophils Relative: 0 %
HCT: 40.3 % (ref 39.0–52.0)
Hemoglobin: 14.2 g/dL (ref 13.0–17.0)
Immature Granulocytes: 1 %
Lymphocytes Relative: 20 %
Lymphs Abs: 1.7 10*3/uL (ref 0.7–4.0)
MCH: 30.5 pg (ref 26.0–34.0)
MCHC: 35.2 g/dL (ref 30.0–36.0)
MCV: 86.5 fL (ref 80.0–100.0)
Monocytes Absolute: 0.7 10*3/uL (ref 0.1–1.0)
Monocytes Relative: 8 %
Neutro Abs: 6.3 10*3/uL (ref 1.7–7.7)
Neutrophils Relative %: 71 %
Platelets: 197 10*3/uL (ref 150–400)
RBC: 4.66 MIL/uL (ref 4.22–5.81)
RDW: 11.2 % — ABNORMAL LOW (ref 11.5–15.5)
WBC: 8.8 10*3/uL (ref 4.0–10.5)
nRBC: 0 % (ref 0.0–0.2)

## 2019-04-14 LAB — COMPREHENSIVE METABOLIC PANEL
ALT: 42 U/L (ref 0–44)
AST: 27 U/L (ref 15–41)
Albumin: 3.1 g/dL — ABNORMAL LOW (ref 3.5–5.0)
Alkaline Phosphatase: 47 U/L (ref 38–126)
Anion gap: 8 (ref 5–15)
BUN: 37 mg/dL — ABNORMAL HIGH (ref 8–23)
CO2: 25 mmol/L (ref 22–32)
Calcium: 10.1 mg/dL (ref 8.9–10.3)
Chloride: 105 mmol/L (ref 98–111)
Creatinine, Ser: 0.78 mg/dL (ref 0.61–1.24)
GFR calc Af Amer: >60 mL/min (ref >60)
GFR calc non Af Amer: >60 mL/min (ref >60)
Glucose, Bld: 168 mg/dL — ABNORMAL HIGH (ref 70–99)
Potassium: 3.4 mmol/L — ABNORMAL LOW (ref 3.5–5.1)
Sodium: 138 mmol/L (ref 135–145)
Total Bilirubin: 0.5 mg/dL (ref 0.3–1.2)
Total Protein: 6.4 g/dL — ABNORMAL LOW (ref 6.5–8.1)

## 2019-04-14 LAB — C-REACTIVE PROTEIN: CRP: 1.2 mg/dL — ABNORMAL HIGH (ref <1.0)

## 2019-04-14 LAB — D-DIMER, QUANTITATIVE: D-Dimer, Quant: 0.62 ug/mL-FEU — ABNORMAL HIGH (ref 0.00–0.50)

## 2019-04-14 MED ORDER — INSULIN ASPART 100 UNIT/ML ~~LOC~~ SOLN
4.0000 [IU] | Freq: Three times a day (TID) | SUBCUTANEOUS | Status: DC
  Administered 2019-04-14 – 2019-04-16 (×7): 4 [IU] via SUBCUTANEOUS

## 2019-04-14 NOTE — Progress Notes (Signed)
Occupational Therapy Evaluation Patient Details Name: William [name] MRN: [id number] DOB: 06/30/1950 Today's Date: 04/14/2019    History of Present Illness 68 y.o. 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 Recent falls PTA. Pt had fall this am and was incontinent of BM. Pt unaware of incontinence and wanting to stay "naked". Pt thinks he is in the hospital because of his back. Pt currently requires mod A with mobility and mod to max A with ADL tasks due to deficits listed below. Pt will benefit from rehab at SNF. VSS throughout session. BP taken in siting/standing - see doc flowsheets - pt not orthostatic but does complain of "dizziness with standing". If pt continues to complain of dizziness with positional changes, may be beneficial to rule out BPPV. Pt positioned in chair using Posey seat belt alarm - able to return demonstrate use of alarm - nsg notified. Will follow acutely.     Follow Up Recommendations  SNF;Supervision/Assistance - 24 hour    Equipment Recommendations  3 in 1 bedside commode    Recommendations for Other Services       Precautions / Restrictions Precautions Precautions: Fall      Mobility Bed Mobility Overal bed mobility: Needs Assistance Bed Mobility: Supine to Sit     Supine to sit: Min assist        Transfers Overall transfer level: Needs assistance Equipment used: 1 person hand held assist Transfers: Sit to/from [employer] Transfers Sit to Stand: Mod assist Stand pivot transfers: Mod assist            Balance Overall balance assessment: History of Falls;Needs assistance Sitting-balance support: Bilateral upper extremity supported;Feet supported Sitting balance-Leahy Scale: Good       Standing balance-Leahy Scale: Poor                             ADL either performed or assessed with clinical judgement   ADL Overall ADL's : Needs assistance/impaired     Grooming: Minimal assistance Grooming Details (indicate cue type and reason): dueto attentional and problem solving deficits Upper Body Bathing: Moderate assistance;Sitting   Lower Body Bathing: Maximal assistance;Sit to/from stand   Upper Body Dressing : Moderate assistance;Sitting   Lower Body Dressing: Maximal assistance;Sit to/from stand   Toilet Transfer: Moderate assistance;Stand-pivot   Toileting- Clothing Manipulation and Hygiene: Maximal assistance Toileting - Clothing Manipulation Details (indicate cue type and reason): incontinenet BM; condom cath     Functional mobility during ADLs: Moderate assistance       Vision Baseline Vision/History: Wears glasses       Perception     Praxis      Pertinent Vitals/Pain Pain Assessment: No/denies pain     Hand Dominance Right   Extremity/Trunk Assessment Upper Extremity Assessment Upper Extremity Assessment: Generalized weakness   Lower Extremity Assessment Lower Extremity Assessment: Defer to PT evaluation   Cervical / Trunk Assessment Cervical / Trunk Assessment: Normal;Other exceptions(hx back surgery )   Communication Communication Communication: No difficulties   Cognition Arousal/Alertness: Awake/alert Behavior During Therapy: Impulsive Overall Cognitive Status: Impaired/Different from baseline Area of Impairment: Orientation;Attention;Memory;Following commands;Safety/judgement;Awareness;Problem solving                 Orientation Level: Disoriented to;Time;Situation(here for his "back") Current Attention Level: Sustained Memory:  Decreased recall of precautions;Decreased short-term memory Following Commands: Follows one step commands consistently Safety/Judgement: Decreased awareness of safety;Decreased awareness of deficits Awareness: Emergent Problem Solving: Slow  processing;Decreased initiation;Difficulty sequencing;Requires verbal cues General Comments: difficulty sustaining attention to brushing teeth   General Comments       Exercises     Shoulder Instructions      Home Living Family/patient expects to be discharged to:: Private residence Living Arrangements: Alone Available Help at Discharge: Family;Available PRN/intermittently Type of Home: House Home Access: Stairs to enter [employer] of Steps: 1 Entrance Stairs-Rails: None Home Layout: Two level;Able to live on main level with bedroom/bathroom Alternate Level Stairs-Number of Steps: flight Alternate Level Stairs-Rails: Right Bathroom Shower/Tub: Occupational psychologist: Standard Bathroom Accessibility: Yes How Accessible: Accessible via walker Home Equipment: [place] - 2 wheels;Bedside commode   Additional Comments: basement level pt like to stay in      Prior Functioning/Environment Level of Independence: Independent with assistive device(s)        Comments: multiple falls        OT Problem List: Decreased strength;Decreased activity tolerance;Impaired balance (sitting and/or standing);Decreased cognition;Decreased safety awareness;Decreased knowledge of use of DME or AE;Cardiopulmonary status limiting activity      OT Treatment/Interventions: Self-care/ADL training;Therapeutic exercise;Neuromuscular education;Energy conservation;DME and/or AE instruction;Therapeutic activities;Cognitive remediation/compensation;Patient/family education;Balance training    OT Goals(Current goals can be found in the care plan section) Acute Rehab OT Goals Patient Stated Goal: none stated OT Goal Formulation: Patient unable to participate in goal setting Time For Goal Achievement: 04/28/19 Potential to Achieve Goals: Good  OT Frequency: Min 2X/week   Barriers to D/C: Decreased caregiver support          Co-evaluation              AM-PAC OT "6 Clicks"  Daily Activity     Outcome Measure Help from another person eating meals?: None Help from another person taking care of personal grooming?: A Little Help from another person toileting, which includes using toliet, bedpan, or urinal?: A Lot Help from another person bathing (including washing, rinsing, drying)?: A Lot Help from another person to put on and taking off regular upper body clothing?: A Lot Help from another person to put on and taking off regular lower body clothing?: Total 6 Click Score: 14   End of Session Equipment Utilized During Treatment: Rolling walker;Gait belt Nurse Communication: Mobility status;Other (comment)(use of seat belt alarm)  Activity Tolerance: Patient tolerated treatment well Patient left: in chair;with call bell/phone within reach;with chair alarm set(Posey seat belt alarm)  OT Visit Diagnosis: Unsteadiness on feet (R26.81);Repeated falls (R29.6);Muscle weakness (generalized) (M62.81);Other symptoms and signs involving cognitive function                Time: [id number] OT Time Calculation (min): 31 min Charges:  OT General Charges $OT Visit: 1 Visit OT Evaluation $OT Eval Moderate Complexity: 1 Mod OT Treatments $Self Care/Home Management : 8-22 mins  Maurie Boettcher, OT/L   Acute OT Clinical Specialist Acute Rehabilitation Services Pager [phone] Office [phone]   [facility] 04/14/2019, 2:18 PM

## 2019-04-14 NOTE — Progress Notes (Signed)
PHYSICAL THERAPY EVALUATION  Clinical Impression: Pt admitted with below diagnosis. Pt is poor historian and most hx given is questionable.  Pt currently with functional limitations due to the deficits listed below (see PT Problem List). Pt denies any dizziness during this session, completed vestibular screen and did not notice any sx. Pt is quite confused about everything, he stated that he uses a walker at home but was unable to ambulate with walker without mod a and max continued step by step cues for ambulation. Pt was on room air throughout session and was abel to maintain sats in 90s. Pt will benefit from skilled PT to increase their independence and safety with mobility to allow discharge to the venue listed below.      04/14/19 1400  PT Visit Information  Last PT Received On 04/14/19  Assistance Needed +1  History of Present Illness 68 y.o. 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,  Precautions  Precautions Fall  Precaution Comments chart reports that pt c/o dizziness but during this assesment he denied any dizziness  Restrictions  Weight Bearing Restrictions No  Home Living  Family/patient expects to be discharged to: Private residence  Living Arrangements Alone  Available Help at Discharge Family;Available PRN/intermittently  Type of Home House  Home Access Stairs to enter  Entrance Stairs-Number of Steps 1  Entrance Stairs-Rails None  Home Layout Two level;Able to live on main level with bedroom/bathroom  Alternate Level Stairs-Number of Steps flight  Alternate Level Stairs-Rails Right  Bathroom Shower/Tub Walk-in Cytogeneticist Yes  Home Equipment Walker - 2 wheels;BSC  Additional Comments basement level pt like to stay in  Prior Function  Level of Independence Independent with assistive device(s)  Comments multiple falls  Communication  Communication No difficulties  Pain  Assessment  Pain Assessment No/denies pain  Cognition  Arousal/Alertness Awake/alert  Behavior During Therapy Impulsive  Overall Cognitive Status Impaired/Different from baseline  Area of Impairment Orientation;Attention;Memory;Following commands;Safety/judgement;Awareness;Problem solving  Orientation Level Disoriented to;Time;Situation (here for his "back")  Current Attention Level Sustained  Memory Decreased recall of precautions;Decreased short-term memory  Following Commands Follows one step commands consistently  Safety/Judgement Decreased awareness of safety;Decreased awareness of deficits  Awareness Emergent  Problem Solving Slow processing;Decreased initiation;Difficulty sequencing;Requires verbal cues  General Comments difficulty sustaining attention to brushing teeth  Upper Extremity Assessment  Upper Extremity Assessment Generalized weakness  Lower Extremity Assessment  Lower Extremity Assessment Generalized weakness  Cervical / Trunk Assessment  Cervical / Trunk Assessment Normal;Other exceptions (hx back surgery )  Bed Mobility  Overal bed mobility Needs Assistance  Bed Mobility Supine to Sit  Supine to sit Min assist  Transfers  Overall transfer level Needs assistance  Equipment used 1 person hand held assist  Transfers Sit to/from Stand;Stand Pivot Transfers  Sit to Stand Mod assist  Stand pivot transfers Mod assist  Ambulation/Gait  Ambulation/Gait assistance Mod assist  Gait Distance (Feet) 30 Feet  Assistive device Rolling walker (2 wheeled)  Gait Pattern/deviations Shuffle  General Gait Details pt needing step by step instructions on ambulation ie push the walker now move your feet with each step  Gait velocity extremely slow  Balance  Overall balance assessment History of Falls;Needs assistance  Sitting-balance support Bilateral upper extremity supported;Feet supported  Sitting balance-Leahy Scale Good  Standing balance-Leahy Scale Poor  PT - End of  Session  Equipment Utilized During Treatment Gait belt  Activity Tolerance Patient tolerated  treatment well  Patient left in bed;with call bell/phone within reach;with bed alarm set  Nurse Communication Mobility status  PT Assessment  PT Recommendation/Assessment Patient needs continued PT services  PT Visit Diagnosis Other abnormalities of gait and mobility (R26.89);Repeated falls (R29.6);Muscle weakness (generalized) (M62.81)  PT Problem List Decreased strength;Decreased range of motion;Decreased activity tolerance;Decreased balance;Decreased mobility;Decreased coordination;Decreased cognition;Decreased safety awareness  Barriers to Discharge Other (comment)  Barriers to Discharge Comments lives alone  PT Plan  PT Frequency (ACUTE ONLY) Min 3X/week  PT Treatment/Interventions (ACUTE ONLY) Gait training;DME instruction;Functional mobility training;Therapeutic activities;Therapeutic exercise;Balance training;Neuromuscular re-education;Patient/family education  AM-PAC PT "6 Clicks" Mobility Outcome Measure (Version 2)  Help needed turning from your back to your side while in a flat bed without using bedrails? 3  Help needed moving from lying on your back to sitting on the side of a flat bed without using bedrails? 3  Help needed moving to and from a bed to a chair (including a wheelchair)? 2  Help needed standing up from a chair using your arms (e.g., wheelchair or bedside chair)? 2  Help needed to walk in hospital room? 2  Help needed climbing 3-5 steps with a railing?  2  6 Click Score 14  Consider Recommendation of Discharge To: CIR/SNF/LTACH  PT Recommendation  Follow Up Recommendations SNF  PT equipment Rolling walker with 5" wheels  Acute Rehab PT Goals  Patient Stated Goal none stated  PT Goal Formulation Patient unable to participate in goal setting  Time For Goal Achievement 04/28/19  Potential to Achieve Goals Fair  PT Time Calculation  PT Start Time (ACUTE ONLY) 1400  PT  Stop Time (ACUTE ONLY) 1418  PT Time Calculation (min) (ACUTE ONLY) 18 min  PT General Charges  $$ ACUTE PT VISIT 1 Visit  PT Evaluation  $PT Eval Moderate Complexity 1 Mod  Written Expression  Dominant Hand Right    Horald Chestnut, PT

## 2019-04-14 NOTE — Progress Notes (Signed)
PROGRESS NOTE                                                                                                                                                                                                             Patient Demographics:    William [name], is a 68 y.o. male, DOB - 03/16/1951, [id number]  Outpatient Primary MD for the patient is William Flow, MD   Admit date - 04/12/2019   LOS - 2  No chief complaint on file.      Brief Narrative: Patient is a 68 y.o. 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   Subjective:   He does not have any chest pain or shortness of breath.  He was lying comfortably in bed.  Patient appears to have unsteady gait-he has fallen once yesterday and again this morning.  We are awaiting PT evaluation.   Assessment  & Plan :    Covid 19 Viral pneumonia: Improved-remains on room air-finish remdesivir on 11/26.  Fever: afebrile   O2 requirements: On RA SpO2: 95 %   COVID-19 Labs: Recent Labs    04/12/19 1720 04/13/19 0150 04/14/19 0220 04/14/19 0230  DDIMER 0.53* 0.27  --  0.62*  CRP 3.3* 2.5* 1.2*  --     No results found for: SARSCOV2NAA   COVID-19 Medications: Steroids: Not on steroids-due to lack of hypoxemia Remdesivir: 11/22>> 11/26 Actemra: Not given Convalescent Plasma: Not given  Other medications: Diuretics:Euvolemic-no need for lasix Antibiotics:Not needed as no evidence of bacterial infection  Prone/Incentive Spirometry: encouraged  incentive spirometry use 3-4/hour.  DVT Prophylaxis  :  Lovenox   Acute metabolic encephalopathy: Suspect from COVID-19 pneumonia-he seems to have improved-he is able to answer most of my questions appropriately.  [facility] at [facility] did not show any  acute abnormalities.  Frequent falls: I suspect this is secondary to deconditioning from COVID-19-however he continues to have falls in spite of improved mentation.  Per patient's significant other-he has had frequent falls at home as well.  CT of the head at [facility] did not show ventriculomegaly-this is a chronic finding.  Per patient-he has never seen a neurologist.  Per patient's significant other-patient is scheduled to  see a neurosurgeon/back surgeon at [facility] a second opinion (used to see Dr. Donzetta Sprung apparently patient needs repeat lumbar spine surgery for spinal stenosis.  Currently he iss awake alert-and is easily able to lift both of his lower extremities off the bed against gravity and against resistance.  Will await PT evaluation-if gait abnormalities demonstrated-he may need further work-up.  Check orthostatics.  DM-2: Still uncontrolled but better than the past few days-continue Levemir 30 units twice daily, add 4 units of NovoLog with meals.  Continue resistant SSI.  Follow.    CBG (last 3)  Recent Labs    04/14/19 0405 04/14/19 0842 04/14/19 1222  GLUCAP 149* 121* 329*   HTN: Controlled-continue amlodipine, HCTZ, Avapro and metoprolol.  Given frequent falls-checking orthostatics.   Dyslipidemia: Continue statin  BPH: Continue Flomax  Gout: No evidence of flare-continue allopurinol  Right lower lobe 5 mm pulmonary nodule: Repeat CT chest in 6-12 months  Obesity: Estimated body mass index is 30.26 kg/m as calculated from the following:   Height as of this encounter: 5\' 11"  (1.803 m).   Weight as of this encounter: 98.4 kg.    Consults  :  None  Procedures  :  None  ABG: No results found for: PHART, PCO2ART, PO2ART, HCO3, TCO2, ACIDBASEDEF, O2SAT  Vent Settings: N/A  Condition - Stable  Family Communication  : Spoke with patient's girlfriend over the phone on 11/26  Code Status :  Full Code  Diet :  Diet Order            Diet heart  healthy/carb modified Room service appropriate? Yes; Fluid consistency: Thin  Diet effective now               Disposition Plan  :  Remain hospitalized  Barriers to discharge: complete 5 days of IV Remdesivir  Antimicorbials  :    Anti-infectives (From admission, onward)   Start     Dose/Rate Route Frequency Ordered Stop   04/13/19 1000  remdesivir 100 mg in sodium chloride 0.9 % 250 mL IVPB     100 mg 500 mL/hr over 30 Minutes Intravenous Daily 04/12/19 1619 04/14/19 1315      Inpatient Medications  Scheduled Meds: . diphenhydrAMINE  50 mg Oral QHS   And  . acetaminophen  1,000 mg Oral QHS  . allopurinol  100 mg Oral Daily  . amLODipine  5 mg Oral Daily  . atorvastatin  10 mg Oral Daily  . enoxaparin (LOVENOX) injection  50 mg Subcutaneous Q24H  . hydrochlorothiazide  25 mg Oral Daily  . insulin aspart  0-20 Units Subcutaneous TID WC  . insulin aspart  0-5 Units Subcutaneous QHS  . insulin detemir  30 Units Subcutaneous BID  . irbesartan  75 mg Oral Daily  . magnesium oxide  400 mg Oral BID  . metoprolol succinate  100 mg Oral Daily  . pantoprazole  40 mg Oral QAC breakfast  . Ensure Max Protein  11 oz Oral BID  . tamsulosin  0.4 mg Oral BID  . vitamin C  500 mg Oral Daily   Continuous Infusions:  PRN Meds:.acetaminophen, polyethylene glycol  Time Spent in minutes  25  See all Orders from today for further details  William [name] M.D on 04/14/2019 at 1:24 PM  To page go to [email or web address] - use universal password  [facility] -  Office  [phone]    Objective:   Vitals:   04/14/19 0906 04/14/19 1100 04/14/19 1110 04/14/19 1249  BP:  126/68 (!) 134/96 (!) 143/80  Pulse: 97     Resp:    17  Temp:    97.7 F (36.5 C)  TempSrc:    Oral  SpO2:   95%   Weight:      Height:        Wt Readings from Last 3 Encounters:  04/12/19 98.4 kg  02/18/19 99.3 kg  08/26/18 93.5 kg     Intake/Output Summary (Last 24 hours) at 04/14/2019 1324 Last  data filed at 04/14/2019 1317 Gross per 24 hour  Intake 460 ml  Output 1100 ml  Net -640 ml    Physical Exam Gen Exam:Alert awake-not in any distress HEENT:atraumatic, normocephalic Chest: B/L clear to auscultation anteriorly CVS:S1S2 regular Abdomen:soft non tender, non distended Extremities:no edema Neurology: Able to lift both lower extremities against gravity-and against resistance.  Easily flexing his both knees and hips.  Sensation appears to be grossly intact. Skin: no rash   Data Review:   CBC Recent Labs  Lab 04/12/19 1720 04/13/19 0150 04/14/19 0230  WBC 9.4 9.2 8.8  HGB 13.2 13.3 14.2  HCT 37.1* 37.9* 40.3  PLT 186 187 197  MCV 90.3 88.1 86.5  MCH 32.1 30.9 30.5  MCHC 35.6 35.1 35.2  RDW 11.6 11.4* 11.2*  LYMPHSABS 0.7 0.8 1.7  MONOABS 0.3 0.4 0.7  EOSABS 0.0 0.0 0.0  BASOSABS 0.0 0.0 0.0    Chemistries  Recent Labs  Lab 04/12/19 1720 04/12/19 2140 04/13/19 0150 04/14/19 0230  NA 138  --  138 138  K 4.3  --  4.0 3.4*  CL 107  --  106 105  CO2 21*  --  22 25  GLUCOSE 405* 436* 378* 168*  BUN 36*  --  37* 37*  CREATININE 0.83  --  0.82 0.78  CALCIUM 9.6  --  9.8 10.1  AST 21  --  18 27  ALT 27  --  28 42  ALKPHOS 49  --  51 47  BILITOT 0.7  --  0.7 0.5   ------------------------------------------------------------------------------------------------------------------ No results for input(s): CHOL, HDL, LDLCALC, TRIG, CHOLHDL, LDLDIRECT in the last 72 hours.  Lab Results  Component Value Date   HGBA1C 9.7 (H) 04/12/2019   ------------------------------------------------------------------------------------------------------------------ No results for input(s): TSH, T4TOTAL, T3FREE, THYROIDAB in the last 72 hours.  Invalid input(s): FREET3 ------------------------------------------------------------------------------------------------------------------ No results for input(s): VITAMINB12, FOLATE, FERRITIN, TIBC, IRON, RETICCTPCT in the last  72 hours.  Coagulation profile No results for input(s): INR, PROTIME in the last 168 hours.  Recent Labs    04/13/19 0150 04/14/19 0230  DDIMER 0.27 0.62*    Cardiac Enzymes No results for input(s): CKMB, TROPONINI, MYOGLOBIN in the last 168 hours.  Invalid input(s): CK ------------------------------------------------------------------------------------------------------------------ No results found for: BNP  Micro Results No results found for this or any previous visit (from the past 240 hour(s)).  Radiology Reports No results found.

## 2019-04-14 NOTE — Plan of Care (Signed)
Patient had a fall this morning. No injury noted. Very impulsive and refuses to follow safety guidelines. Removed condom cath several times.    Problem: Education: Goal: Knowledge of risk factors and measures for prevention of condition will improve Outcome: Progressing   Problem: Coping: Goal: Psychosocial and spiritual needs will be supported Outcome: Progressing   Problem: Respiratory: Goal: Will maintain a patent airway Outcome: Progressing Goal: Complications related to the disease process, condition or treatment will be avoided or minimized Outcome: Progressing   Problem: Education: Goal: Knowledge of General Education information will improve Description: Including pain rating scale, medication(s)/side effects and non-pharmacologic comfort measures Outcome: Progressing   Problem: Health Behavior/Discharge Planning: Goal: Ability to manage health-related needs will improve Outcome: Progressing   Problem: Clinical Measurements: Goal: Ability to maintain clinical measurements within normal limits will improve Outcome: Progressing Goal: Will remain free from infection Outcome: Progressing Goal: Diagnostic test results will improve Outcome: Progressing Goal: Respiratory complications will improve Outcome: Progressing Goal: Cardiovascular complication will be avoided Outcome: Progressing   Problem: Activity: Goal: Risk for activity intolerance will decrease Outcome: Progressing   Problem: Nutrition: Goal: Adequate nutrition will be maintained Outcome: Progressing   Problem: Coping: Goal: Level of anxiety will decrease Outcome: Progressing   Problem: Elimination: Goal: Will not experience complications related to bowel motility Outcome: Progressing Goal: Will not experience complications related to urinary retention Outcome: Progressing   Problem: Pain Managment: Goal: General experience of comfort will improve Outcome: Progressing   Problem: Safety: Goal:  Ability to remain free from injury will improve Outcome: Progressing   Problem: Skin Integrity: Goal: Risk for impaired skin integrity will decrease Outcome: Progressing

## 2019-04-14 NOTE — Progress Notes (Signed)
Was the fall witnessed: No  Patient condition before and after the fall: A&Ox4  Patient's reaction to the fall: No injury sustained  Name of the doctor that was notified including date and time: [place]. 11/26 @ 1126 am  Any interventions and vital signs: Alarms activated, call light in reach, patient educated on safety precautions. Will move patient to room near nurses station when it becomes available. VSS

## 2019-04-14 NOTE — Progress Notes (Signed)
Daughter updated via telephone.

## 2019-04-15 LAB — GLUCOSE, CAPILLARY
Glucose-Capillary: 123 mg/dL — ABNORMAL HIGH (ref 70–99)
Glucose-Capillary: 228 mg/dL — ABNORMAL HIGH (ref 70–99)
Glucose-Capillary: 222 mg/dL — ABNORMAL HIGH (ref 70–99)
Glucose-Capillary: 218 mg/dL — ABNORMAL HIGH (ref 70–99)

## 2019-04-15 LAB — COMPREHENSIVE METABOLIC PANEL
ALT: 54 U/L — ABNORMAL HIGH (ref 0–44)
AST: 30 U/L (ref 15–41)
Albumin: 3 g/dL — ABNORMAL LOW (ref 3.5–5.0)
Alkaline Phosphatase: 50 U/L (ref 38–126)
Anion gap: 11 (ref 5–15)
BUN: 32 mg/dL — ABNORMAL HIGH (ref 8–23)
CO2: 25 mmol/L (ref 22–32)
Calcium: 9.9 mg/dL (ref 8.9–10.3)
Chloride: 104 mmol/L (ref 98–111)
Creatinine, Ser: 0.78 mg/dL (ref 0.61–1.24)
GFR calc Af Amer: >60 mL/min (ref >60)
GFR calc non Af Amer: >60 mL/min (ref >60)
Glucose, Bld: 76 mg/dL (ref 70–99)
Potassium: 3.4 mmol/L — ABNORMAL LOW (ref 3.5–5.1)
Sodium: 140 mmol/L (ref 135–145)
Total Bilirubin: 0.8 mg/dL (ref 0.3–1.2)
Total Protein: 6.2 g/dL — ABNORMAL LOW (ref 6.5–8.1)

## 2019-04-15 LAB — CBC WITH DIFFERENTIAL/PLATELET
Abs Immature Granulocytes: 0.09 10*3/uL — ABNORMAL HIGH (ref 0.00–0.07)
Basophils Absolute: 0 10*3/uL (ref 0.0–0.1)
Basophils Relative: 0 %
Eosinophils Absolute: 0 10*3/uL (ref 0.0–0.5)
Eosinophils Relative: 1 %
HCT: 41.7 % (ref 39.0–52.0)
Hemoglobin: 14.7 g/dL (ref 13.0–17.0)
Immature Granulocytes: 1 %
Lymphocytes Relative: 24 %
Lymphs Abs: 2.1 10*3/uL (ref 0.7–4.0)
MCH: 30.2 pg (ref 26.0–34.0)
MCHC: 35.3 g/dL (ref 30.0–36.0)
MCV: 85.6 fL (ref 80.0–100.0)
Monocytes Absolute: 0.7 10*3/uL (ref 0.1–1.0)
Monocytes Relative: 8 %
Neutro Abs: 5.8 10*3/uL (ref 1.7–7.7)
Neutrophils Relative %: 66 %
Platelets: 235 10*3/uL (ref 150–400)
RBC: 4.87 MIL/uL (ref 4.22–5.81)
RDW: 11.2 % — ABNORMAL LOW (ref 11.5–15.5)
WBC: 8.8 10*3/uL (ref 4.0–10.5)
nRBC: 0 % (ref 0.0–0.2)

## 2019-04-15 LAB — D-DIMER, QUANTITATIVE: D-Dimer, Quant: 0.67 ug/mL-FEU — ABNORMAL HIGH (ref 0.00–0.50)

## 2019-04-15 LAB — C-REACTIVE PROTEIN: CRP: 2.1 mg/dL — ABNORMAL HIGH (ref <1.0)

## 2019-04-15 MED ORDER — INSULIN DETEMIR 100 UNIT/ML ~~LOC~~ SOLN
22.0000 [IU] | Freq: Two times a day (BID) | SUBCUTANEOUS | Status: DC
  Administered 2019-04-15 – 2019-04-16 (×3): 22 [IU] via SUBCUTANEOUS
  Filled 2019-04-15 (×4): qty 0.22

## 2019-04-15 MED ORDER — GUAIFENESIN 100 MG/5ML PO SOLN
5.0000 mL | ORAL | Status: DC | PRN
  Administered 2019-04-15: 100 mg via ORAL
  Filled 2019-04-15: qty 15

## 2019-04-15 MED ORDER — POTASSIUM CHLORIDE CRYS ER 20 MEQ PO TBCR
40.0000 meq | EXTENDED_RELEASE_TABLET | Freq: Once | ORAL | Status: AC
  Administered 2019-04-15: 40 meq via ORAL
  Filled 2019-04-15: qty 2

## 2019-04-15 NOTE — TOC Initial Note (Signed)
Transition of Care [facility]) - Initial/Assessment Note    Patient Details  Name: Sherrod Hatchell MRN: [id number] Date of Birth: 1950-09-19  Transition of Care [facility]) CM/SW Contact:    Ninfa Meeker, RN Phone Number: 04/15/2019, 1:33 PM  Clinical Narrative:  Case manager spoke with patient's daughter- Nadara Eaton [phone], concerning her dad's need for SNF at discharge. Cecille Rubin stated her dad had been at [facility] in March and she understands he can not return there with COVID. Agreed to patient being faxed to [facility], she doesn't want her dad going any further away. Case manager completed FL2, sent initial  Referral to [facility]. Will continue to monitor.               Expected Discharge Plan: Skilled Nursing Facility Barriers to Discharge: SNF Pending bed offer   Patient Goals and CMS Choice Patient states their goals for this hospitalization and ongoing recovery are:: per daughter for patient to get better   Choice offered to / list presented to : Adult Children  Expected Discharge Plan and Services Expected Discharge Plan: [place] In-house Referral: NA Discharge Planning Services: CM Consult Post Acute Care Choice: [place]                   DME Arranged: N/A         HH Arranged: NA [place]: NA        Prior Living Arrangements/Services                       Activities of Daily Living Home Assistive Devices/Equipment: Bedside commode/3-in-1, Built-in shower seat, Eyeglasses, Cane (specify quad or straight), Raised toilet seat with rails, Shower chair with back, Environmental consultant (specify type) ADL Screening (condition at time of admission) Patient's cognitive ability adequate to safely complete daily activities?: Yes Is the patient deaf or have difficulty hearing?: No Does the patient have difficulty seeing, even when wearing glasses/contacts?: No Does the patient have difficulty concentrating, remembering, or making  decisions?: No Patient able to express need for assistance with ADLs?: Yes Does the patient have difficulty dressing or bathing?: No Independently performs ADLs?: Yes (appropriate for developmental age) Does the patient have difficulty walking or climbing stairs?: Yes Weakness of Legs: Both Weakness of Arms/Hands: None  Permission Sought/Granted                  Emotional Assessment              Admission diagnosis:  COVID 19 Patient Active Problem List   Diagnosis Date Noted  . Generalized weakness 04/12/2019  . AKI (acute kidney injury) ([place]) 04/12/2019  . COVID-19 virus infection 04/12/2019  . Acute metabolic encephalopathy [id number]  . Abnormality of gait 08/26/2018  . Slow transit constipation   . Diabetes mellitus type 2 in nonobese ([facility])   . Labile blood glucose   . Sundowning   . Fall   . Hyponatremia   . New onset type 2 diabetes mellitus ([place])   . Transaminitis   . Hypoalbuminemia due to protein-calorie malnutrition ([place])   . Hyperglycemia   . Urinary incontinence due to benign prostatic hyperplasia 07/08/2018  . Spinal stenosis of lumbar region with radiculopathy 07/08/2018  . Epidural hematoma ([place]) 07/05/2018  . Acute urinary retention 07/04/2018  . Lumbar stenosis with neurogenic claudication 02/24/2018  . Pain 08/19/2017  . Primary osteoarthritis of first carpometacarpal joint of left hand 08/19/2017  . Lumbar radiculopathy 06/02/2017  .  S/P total knee replacement 12/08/2016  . Chronic pain of both knees 07/14/2016  . BPH with obstruction/lower urinary tract symptoms 12/04/2015  . Malignant neoplasm of urinary bladder ([place]) 12/04/2015   PCP:  Mateo Flow, MD Pharmacy:   [id number] - [place], [address] [place] [address] Phone: [phone] Fax: [phone]     Social Determinants of Health (SDOH) Interventions    Readmission Risk Interventions No flowsheet data  found.

## 2019-04-15 NOTE — Progress Notes (Signed)
PROGRESS NOTE                                                                                                                                                                                                             Patient Demographics:    William [name], is a 68 y.o. male, DOB - Apr 13, 1951, [id number]  Outpatient Primary MD for the patient is Mateo Flow, MD   Admit date - 04/12/2019   LOS - 3  No chief complaint on file.      Brief Narrative: Patient is a 68 y.o. 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   Subjective:   Lying comfortably in bed-denies any chest pain or shortness of breath.   Assessment  & Plan :    Covid 19 Viral pneumonia: He is clinically stable-on room air-he has finished a course of remdesivir on 11/27.  Continue supportive care.  Fever: afebrile   O2 requirements: On RA SpO2: 92 %   COVID-19 Labs: Recent Labs    04/13/19 0150 04/14/19 0220 04/14/19 0230 04/15/19 0255  DDIMER 0.27  --  0.62* 0.67*  CRP 2.5* 1.2*  --  2.1*    No results found for: SARSCOV2NAA   COVID-19 Medications: Steroids: Not on steroids-due to lack of hypoxemia Remdesivir: 11/22>> 11/26 Actemra: Not given Convalescent Plasma: Not given  Other medications: Diuretics:Euvolemic-no need for lasix Antibiotics:Not needed as no evidence of bacterial infection  Prone/Incentive Spirometry: encouraged  incentive spirometry use 3-4/hour.  DVT Prophylaxis  :  Lovenox   Acute metabolic encephalopathy: Suspect from COVID-19 pneumonia-he seems to have improved-he is able to answer most of my questions appropriately-although he is a poor historian.  CT at [facility] did not show any acute abnormalities.  Per patient's daughter who I spoke to  on 11/27-patient gets very inpatient-and purposefully gives wrong answers at times, and at times he gives wrong answers as a joke.  Frequent falls: This is an issue since February 2020-when patient had low back surgery complicated by epidural hematoma.  He had a long hospitalization-requiring transfer to CIR and then to SNF.  Since that time-patient has unsteady gait and frequent falls along with frequency of urination/incontinence-and has been using a cane  and walker.  He has had frequent falls since then as well.  Per patient's significant other-patient is scheduled to see a neurosurgeon/back surgeon at [place] a second opinion (used to see Dr. Donzetta Sprung apparently patient needs repeat lumbar spine surgery for spinal stenosis.  Currently he is awake alert-and is easily able to lift both of his lower extremities off the bed against gravity and against resistance.  His sensation is grossly intact as well.  Orthostatics are negative  Since this is a chronic issue-further work-up will be deferred to the outpatient setting-family (daughter whom I spoke to on the phone) will reschedule patient's upcoming neurosurgical appointment.  Family is aware of the recommendation by PT for SNF.  DM-2: Difficult to control CBGs with episodes of hyper and hypoglycemia-decrease Levemir to 22 units twice daily-continue avoidance of NovoLog and resistant SSI.  Follow and adjust  CBG (last 3)  Recent Labs    04/14/19 2037 04/14/19 2131 04/15/19 0743  GLUCAP 62* 93 123*   HTN: Controlled-continue amlodipine, HCTZ, Avapro and metoprolol.  Given frequent falls-checking orthostatics.   Dyslipidemia: Continue statin  BPH: Continue Flomax  Gout: No evidence of flare-continue allopurinol  Right lower lobe 5 mm pulmonary nodule: Repeat CT chest in 6-12 months  Obesity: Estimated body mass index is 30.26 kg/m as calculated from the following:   Height as of this encounter: 5\' 11"  (1.803 m).   Weight as of  this encounter: 98.4 kg.    Consults  :  None  Procedures  :  None  ABG: No results found for: PHART, PCO2ART, PO2ART, HCO3, TCO2, ACIDBASEDEF, O2SAT  Vent Settings: N/A  Condition - Stable  Family Communication  : Daughter over the phone on 11/27  Code Status :  Full Code  Diet :  Diet Order            Diet heart healthy/carb modified Room service appropriate? Yes; Fluid consistency: Thin  Diet effective now               Disposition Plan  :  Remain hospitalized  Barriers to discharge: Unsteady gait-deconditioning-needs SNF  Antimicorbials  :    Anti-infectives (From admission, onward)   Start     Dose/Rate Route Frequency Ordered Stop   04/13/19 1000  remdesivir 100 mg in sodium chloride 0.9 % 250 mL IVPB     100 mg 500 mL/hr over 30 Minutes Intravenous Daily 04/12/19 1619 04/14/19 1315      Inpatient Medications  Scheduled Meds: . diphenhydrAMINE  50 mg Oral QHS   And  . acetaminophen  1,000 mg Oral QHS  . allopurinol  100 mg Oral Daily  . amLODipine  5 mg Oral Daily  . atorvastatin  10 mg Oral Daily  . enoxaparin (LOVENOX) injection  50 mg Subcutaneous Q24H  . insulin aspart  0-20 Units Subcutaneous TID WC  . insulin aspart  0-5 Units Subcutaneous QHS  . insulin aspart  4 Units Subcutaneous TID WC  . insulin detemir  30 Units Subcutaneous BID  . irbesartan  75 mg Oral Daily  . magnesium oxide  400 mg Oral BID  . metoprolol succinate  100 mg Oral Daily  . pantoprazole  40 mg Oral QAC breakfast  . Ensure Max Protein  11 oz Oral BID  . tamsulosin  0.4 mg Oral BID  . vitamin C  500 mg Oral Daily   Continuous Infusions:  PRN Meds:.acetaminophen, polyethylene glycol  Time Spent in minutes  25  See all Orders  from today for further details  Oren Binet M.D on 04/15/2019 at 12:00 PM  To page go to [email or web address] - use universal password  [facility] -  Office  [phone]    Objective:   Vitals:   04/14/19 2300 04/15/19 0416  04/15/19 0749 04/15/19 1130  BP:  140/82 132/79 119/78  Pulse:  68 68 77  Resp:  20 18 18   Temp: (!) 97.2 F (36.2 C) 97.6 F (36.4 C) (!) 97.4 F (36.3 C)   TempSrc: Oral Oral Oral   SpO2:  96% 98% 92%  Weight:      Height:        Wt Readings from Last 3 Encounters:  04/12/19 98.4 kg  02/18/19 99.3 kg  08/26/18 93.5 kg     Intake/Output Summary (Last 24 hours) at 04/15/2019 1200 Last data filed at 04/15/2019 0941 Gross per 24 hour  Intake 460 ml  Output 1100 ml  Net -640 ml    Physical Exam Gen Exam:Alert awake-not in any distress HEENT:atraumatic, normocephalic Chest: B/L clear to auscultation anteriorly CVS:S1S2 regular Abdomen:soft non tender, non distended Extremities:no edema Neurology: Non focal Skin: no rash   Data Review:   CBC Recent Labs  Lab 04/12/19 1720 04/13/19 0150 04/14/19 0230 04/15/19 0255  WBC 9.4 9.2 8.8 8.8  HGB 13.2 13.3 14.2 14.7  HCT 37.1* 37.9* 40.3 41.7  PLT 186 187 197 235  MCV 90.3 88.1 86.5 85.6  MCH 32.1 30.9 30.5 30.2  MCHC 35.6 35.1 35.2 35.3  RDW 11.6 11.4* 11.2* 11.2*  LYMPHSABS 0.7 0.8 1.7 2.1  MONOABS 0.3 0.4 0.7 0.7  EOSABS 0.0 0.0 0.0 0.0  BASOSABS 0.0 0.0 0.0 0.0    Chemistries  Recent Labs  Lab 04/12/19 1720 04/12/19 2140 04/13/19 0150 04/14/19 0230 04/15/19 0255  NA 138  --  138 138 140  K 4.3  --  4.0 3.4* 3.4*  CL 107  --  106 105 104  CO2 21*  --  22 25 25   GLUCOSE 405* 436* 378* 168* 76  BUN 36*  --  37* 37* 32*  CREATININE 0.83  --  0.82 0.78 0.78  CALCIUM 9.6  --  9.8 10.1 9.9  AST 21  --  18 27 30   ALT 27  --  28 42 54*  ALKPHOS 49  --  51 47 50  BILITOT 0.7  --  0.7 0.5 0.8   ------------------------------------------------------------------------------------------------------------------ No results for input(s): CHOL, HDL, LDLCALC, TRIG, CHOLHDL, LDLDIRECT in the last 72 hours.  Lab Results  Component Value Date   HGBA1C 9.7 (H) 04/12/2019    ------------------------------------------------------------------------------------------------------------------ No results for input(s): TSH, T4TOTAL, T3FREE, THYROIDAB in the last 72 hours.  Invalid input(s): FREET3 ------------------------------------------------------------------------------------------------------------------ No results for input(s): VITAMINB12, FOLATE, FERRITIN, TIBC, IRON, RETICCTPCT in the last 72 hours.  Coagulation profile No results for input(s): INR, PROTIME in the last 168 hours.  Recent Labs    04/14/19 0230 04/15/19 0255  DDIMER 0.62* 0.67*    Cardiac Enzymes No results for input(s): CKMB, TROPONINI, MYOGLOBIN in the last 168 hours.  Invalid input(s): CK ------------------------------------------------------------------------------------------------------------------ No results found for: BNP  Micro Results No results found for this or any previous visit (from the past 240 hour(s)).  Radiology Reports No results found.

## 2019-04-15 NOTE — NC FL2 (Signed)
[place] LEVEL OF CARE SCREENING TOOL     IDENTIFICATION  Patient Name: William [name] Birthdate: 10-30-1950 Sex: male Admission Date (Current Location): 04/12/2019  [facility] and [place] Number:  Herbalist and Address:  [facility] [place], [place] [address] [place], [place] [address]([address]      Provider Number: [id number]  Attending Physician Name and Address:  Jonetta Osgood, MD  Relative Name and Phone Number:  Daughter: Nadara Eaton [phone]    Current Level of Care: Hospital Recommended Level of Care: [place] Prior Approval Number:    Date Approved/Denied:   PASRR Number: [id number]  Discharge Plan: SNF    Current Diagnoses: Patient Active Problem List   Diagnosis Date Noted  . Generalized weakness 04/12/2019  . AKI (acute kidney injury) ([place]) 04/12/2019  . COVID-19 virus infection 04/12/2019  . Acute metabolic encephalopathy [id number]  . Abnormality of gait 08/26/2018  . Slow transit constipation   . Diabetes mellitus type 2 in nonobese (HCC)   . Labile blood glucose   . Sundowning   . Fall   . Hyponatremia   . New onset type 2 diabetes mellitus ([place])   . Transaminitis   . Hypoalbuminemia due to protein-calorie malnutrition ([place])   . Hyperglycemia   . Urinary incontinence due to benign prostatic hyperplasia 07/08/2018  . Spinal stenosis of lumbar region with radiculopathy 07/08/2018  . Epidural hematoma ([place]) 07/05/2018  . Acute urinary retention 07/04/2018  . Lumbar stenosis with neurogenic claudication 02/24/2018  . Pain 08/19/2017  . Primary osteoarthritis of first carpometacarpal joint of left hand 08/19/2017  . Lumbar radiculopathy 06/02/2017  . S/P total knee replacement 12/08/2016  . Chronic pain of both knees 07/14/2016  . BPH with obstruction/lower urinary tract symptoms 12/04/2015  . Malignant neoplasm of urinary bladder (HCC) 12/04/2015    Orientation  RESPIRATION BLADDER Height & Weight     Self, Time, Situation, Place(fluctates)  Normal Incontinent Weight: 98.4 kg Height:  5\' 11"  (180.3 cm)  BEHAVIORAL SYMPTOMS/MOOD NEUROLOGICAL BOWEL NUTRITION STATUS  Other (Comment)(falls)   Incontinent    AMBULATORY STATUS COMMUNICATION OF NEEDS Skin   Extensive Assist   Normal                       Personal Care Assistance Level of Assistance  Dressing, Bathing Bathing Assistance: Maximum assistance   Dressing Assistance: Limited assistance     Functional Limitations Info             SPECIAL CARE FACTORS FREQUENCY  PT (By licensed PT), OT (By licensed OT)     PT Frequency: 5x/week OT Frequency: min 3x/week            Contractures Contractures Info: Not present    Additional Factors Info  Code Status, Allergies Code Status Info: full code Allergies Info: Sulfa antibiotics, Sulfastazine-dermatitis           Current Medications (04/15/2019):  This is the current hospital active medication list Current Facility-Administered Medications  Medication Dose Route Frequency Provider Last Rate Last Dose  . diphenhydrAMINE (BENADRYL) capsule 50 mg  50 mg Oral QHS Charlynne Cousins, MD   50 mg at 04/14/19 2049   And  . acetaminophen (TYLENOL) tablet 1,000 mg  1,000 mg Oral QHS Charlynne Cousins, MD   1,000 mg at 04/14/19 2049  . acetaminophen (TYLENOL) tablet 325-650 mg  325-650 mg Oral Q4H PRN Charlynne Cousins, MD      .  allopurinol (ZYLOPRIM) tablet 100 mg  100 mg Oral Daily Charlynne Cousins, MD   100 mg at 04/15/19 1011  . amLODipine (NORVASC) tablet 5 mg  5 mg Oral Daily Charlynne Cousins, MD   5 mg at 04/15/19 1012  . atorvastatin (LIPITOR) tablet 10 mg  10 mg Oral Daily Charlynne Cousins, MD   10 mg at 04/15/19 1011  . enoxaparin (LOVENOX) injection 50 mg  50 mg Subcutaneous Q24H Charlynne Cousins, MD   50 mg at 04/14/19 2049  . insulin aspart (novoLOG) injection 0-20 Units  0-20 Units Subcutaneous  TID WC Jonetta Osgood, MD   7 Units at 04/15/19 1231  . insulin aspart (novoLOG) injection 0-5 Units  0-5 Units Subcutaneous QHS Jonetta Osgood, MD   4 Units at 04/13/19 2135  . insulin aspart (novoLOG) injection 4 Units  4 Units Subcutaneous TID WC Jonetta Osgood, MD   4 Units at 04/15/19 1230  . insulin detemir (LEVEMIR) injection 22 Units  22 Units Subcutaneous BID Ghimire, Henreitta Leber, MD      . irbesartan (AVAPRO) tablet 75 mg  75 mg Oral Daily Charlynne Cousins, MD   75 mg at 04/15/19 1014  . magnesium oxide (MAG-OX) tablet 400 mg  400 mg Oral BID Charlynne Cousins, MD   400 mg at 04/15/19 1010  . metoprolol succinate (TOPROL-XL) 24 hr tablet 100 mg  100 mg Oral Daily Charlynne Cousins, MD   100 mg at 04/15/19 1011  . pantoprazole (PROTONIX) EC tablet 40 mg  40 mg Oral QAC breakfast Charlynne Cousins, MD   40 mg at 04/15/19 1010  . polyethylene glycol (MIRALAX / GLYCOLAX) packet 17 g  17 g Oral Daily PRN Charlynne Cousins, MD      . protein supplement (ENSURE MAX) liquid  11 oz Oral BID Jonetta Osgood, MD   11 oz at 04/15/19 1234  . tamsulosin (FLOMAX) capsule 0.4 mg  0.4 mg Oral BID Charlynne Cousins, MD   0.4 mg at 04/15/19 1010  . vitamin C (ASCORBIC ACID) tablet 500 mg  500 mg Oral Daily Charlynne Cousins, MD   500 mg at 04/15/19 1011     Discharge Medications: Please see discharge summary for a list of discharge medications.  Relevant Imaging Results:  Relevant Lab Results:   Additional Information ss# [id number]  Ninfa Meeker, RN

## 2019-04-15 NOTE — Plan of Care (Signed)

## 2019-04-16 LAB — GLUCOSE, CAPILLARY
Glucose-Capillary: 80 mg/dL (ref 70–99)
Glucose-Capillary: 245 mg/dL — ABNORMAL HIGH (ref 70–99)
Glucose-Capillary: 208 mg/dL — ABNORMAL HIGH (ref 70–99)
Glucose-Capillary: 173 mg/dL — ABNORMAL HIGH (ref 70–99)

## 2019-04-16 LAB — CBC WITH DIFFERENTIAL/PLATELET
Abs Immature Granulocytes: 0.15 10*3/uL — ABNORMAL HIGH (ref 0.00–0.07)
Basophils Absolute: 0 10*3/uL (ref 0.0–0.1)
Basophils Relative: 1 %
Eosinophils Absolute: 0.2 10*3/uL (ref 0.0–0.5)
Eosinophils Relative: 3 %
HCT: 40.6 % (ref 39.0–52.0)
Hemoglobin: 14.2 g/dL (ref 13.0–17.0)
Immature Granulocytes: 2 %
Lymphocytes Relative: 29 %
Lymphs Abs: 2.5 10*3/uL (ref 0.7–4.0)
MCH: 30.7 pg (ref 26.0–34.0)
MCHC: 35 g/dL (ref 30.0–36.0)
MCV: 87.7 fL (ref 80.0–100.0)
Monocytes Absolute: 0.6 10*3/uL (ref 0.1–1.0)
Monocytes Relative: 7 %
Neutro Abs: 5 10*3/uL (ref 1.7–7.7)
Neutrophils Relative %: 58 %
Platelets: 208 10*3/uL (ref 150–400)
RBC: 4.63 MIL/uL (ref 4.22–5.81)
RDW: 11.4 % — ABNORMAL LOW (ref 11.5–15.5)
WBC: 8.5 10*3/uL (ref 4.0–10.5)
nRBC: 0 % (ref 0.0–0.2)

## 2019-04-16 LAB — COMPREHENSIVE METABOLIC PANEL
ALT: 78 U/L — ABNORMAL HIGH (ref 0–44)
AST: 36 U/L (ref 15–41)
Albumin: 2.9 g/dL — ABNORMAL LOW (ref 3.5–5.0)
Alkaline Phosphatase: 46 U/L (ref 38–126)
Anion gap: 9 (ref 5–15)
BUN: 34 mg/dL — ABNORMAL HIGH (ref 8–23)
CO2: 27 mmol/L (ref 22–32)
Calcium: 10.1 mg/dL (ref 8.9–10.3)
Chloride: 105 mmol/L (ref 98–111)
Creatinine, Ser: 0.85 mg/dL (ref 0.61–1.24)
GFR calc Af Amer: >60 mL/min (ref >60)
GFR calc non Af Amer: >60 mL/min (ref >60)
Glucose, Bld: 81 mg/dL (ref 70–99)
Potassium: 3.8 mmol/L (ref 3.5–5.1)
Sodium: 141 mmol/L (ref 135–145)
Total Bilirubin: 0.9 mg/dL (ref 0.3–1.2)
Total Protein: 5.8 g/dL — ABNORMAL LOW (ref 6.5–8.1)

## 2019-04-16 LAB — D-DIMER, QUANTITATIVE: D-Dimer, Quant: 0.65 ug/mL-FEU — ABNORMAL HIGH (ref 0.00–0.50)

## 2019-04-16 LAB — C-REACTIVE PROTEIN: CRP: 2.4 mg/dL — ABNORMAL HIGH (ref <1.0)

## 2019-04-16 NOTE — Plan of Care (Signed)

## 2019-04-16 NOTE — Progress Notes (Signed)
Physical Therapy Treatment Patient Details Name: William [name] MRN: [id number] DOB: 03-08-1951 Today's Date: 04/16/2019    History of Present Illness 68 y.o. 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 Noted that pt has festinating, ataxic shuffling gait. He states he feels as if his feet are glued to the ground. In sitting he does well with hip flexion but once standing even with UE support on walker has great difficulty with hip flexion. Have again screened for vestibular sx but none were found also pt denies having any dizziness this am. Pt still on room air and sats in 90s throughout session.    Follow Up Recommendations  SNF     Equipment Recommendations  Rolling walker with 5" wheels    Recommendations for Other Services       Precautions / Restrictions Precautions Precautions: Fall Precaution Comments: once again denies dizziness, vestibular screen again with no sx noted Restrictions Weight Bearing Restrictions: No    Mobility  Bed Mobility Overal bed mobility: Needs Assistance Bed Mobility: Supine to Sit     Supine to sit: Min guard        Transfers Overall transfer level: Needs assistance Equipment used: Rolling walker (2 wheeled);1 person hand held assist Transfers: Sit to/from [facility] Sit to Stand: Mod assist Stand pivot transfers: Mod assist          Ambulation/Gait Ambulation/Gait assistance: Mod assist Gait Distance (Feet): 120 Feet Assistive device: Rolling walker (2 wheeled) Gait Pattern/deviations: Step-to pattern;Shuffle;Ataxic;Festinating;Trunk flexed;Wide base of support Gait velocity: extremely slow   General Gait Details: still needing continued cues for ambulation, ie "pick up your feet"  noted some festinating today   Stairs             Wheelchair Mobility    Modified Rankin (Stroke Patients Only)       Balance Overall balance assessment: History of Falls;Needs assistance Sitting-balance support: Bilateral upper extremity supported;Feet supported Sitting balance-Leahy Scale: Good     Standing balance support: During functional activity;Bilateral upper extremity supported Standing balance-Leahy Scale: Poor                              Cognition Arousal/Alertness: Awake/alert Behavior During Therapy: Anxious Overall Cognitive Status: Impaired/Different from baseline Area of Impairment: Memory;Attention;Following commands;Safety/judgement;Awareness;Problem solving                 Orientation Level: Disoriented to;Time;Situation Current Attention Level: Alternating Memory: Decreased recall of precautions;Decreased short-term memory Following Commands: Follows one step commands inconsistently Safety/Judgement: Decreased awareness of safety;Decreased awareness of deficits   Problem Solving: Slow processing;Difficulty sequencing;Requires verbal cues;Requires tactile cues        Exercises General Exercises - Lower Extremity Hip Flexion/Marching: AROM;Both;Strengthening    General Comments        Pertinent Vitals/Pain Pain Assessment: No/denies pain    Home Living                      Prior Function            PT Goals (current goals can now be found in the care plan section) Acute Rehab PT Goals Patient Stated Goal: states that his legs feel as if his legs are  stuck to the floor PT Goal Formulation: Patient unable to participate in goal setting Time For Goal Achievement: 04/28/19 Potential to Achieve Goals: Fair Progress towards PT goals: Progressing toward goals    Frequency    Min 3X/week      PT Plan Current plan remains appropriate    Co-evaluation              AM-PAC PT "6 Clicks"  Mobility   Outcome Measure  Help needed turning from your back to your side while in a flat bed without using bedrails?: A Little Help needed moving from lying on your back to sitting on the side of a flat bed without using bedrails?: A Little Help needed moving to and from a bed to a chair (including a wheelchair)?: A Lot Help needed standing up from a chair using your arms (e.g., wheelchair or bedside chair)?: A Lot Help needed to walk in hospital room?: A Lot Help needed climbing 3-5 steps with a railing? : A Lot 6 Click Score: 14    End of Session Equipment Utilized During Treatment: Gait belt Activity Tolerance: Patient limited by fatigue;Patient limited by lethargy Patient left: in chair;with call bell/phone within reach;with chair alarm set;with nursing/sitter in room Nurse Communication: Mobility status PT Visit Diagnosis: Other abnormalities of gait and mobility (R26.89);Repeated falls (R29.6);Muscle weakness (generalized) (M62.81)     Time: [id number] PT Time Calculation (min) (ACUTE ONLY): 23 min  Charges:  $Gait Training: 8-22 mins $Therapeutic Activity: 8-22 mins                     Horald Chestnut, PT    Delford Field 04/16/2019, 12:16 PM

## 2019-04-16 NOTE — Progress Notes (Signed)
PROGRESS NOTE                                                                                                                                                                                                             Patient Demographics:    William [name], is a 68 y.o. male, DOB - June 23, 1950, [id number]  Outpatient Primary MD for the patient is Mateo Flow, MD   Admit date - 04/12/2019   LOS - 4  No chief complaint on file.      Brief Narrative: Patient is a 68 y.o. 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   Subjective:   Lying comfortably in bed-denies any chest pain or shortness of breath.  Easily able to lift both legs off the bed-and flex at knees and hip.   Assessment  & Plan :    Covid 19 Viral pneumonia: He is clinically stable-on room air-he has finished a course of remdesivir on 11/27.  Continue supportive care.  Fever: afebrile   O2 requirements: On RA SpO2: 97 %   COVID-19 Labs: Recent Labs    04/14/19 0220 04/14/19 0230 04/15/19 0255 04/16/19 0437  DDIMER  --  0.62* 0.67* 0.65*  CRP 1.2*  --  2.1* 2.4*    No results found for: SARSCOV2NAA   COVID-19 Medications: Steroids: Not on steroids-due to lack of hypoxemia Remdesivir: 11/22>> 11/26 Actemra: Not given Convalescent Plasma: Not given  Other medications: Diuretics:Euvolemic-no need for lasix Antibiotics:Not needed as no evidence of bacterial infection  Prone/Incentive Spirometry: encouraged  incentive spirometry use 3-4/hour.  DVT Prophylaxis  :  Lovenox   Acute metabolic encephalopathy: Suspect from COVID-19 pneumonia-he seems to have improved-he is able to answer most of my questions appropriately-although he is a poor historian.  CT at [facility] did  not show any acute abnormalities.  Per patient's daughter who I spoke to on 11/27-patient gets very inpatient-and purposefully gives wrong answers at times, and at times he gives wrong answers as a joke.  Frequent falls: This is an issue since February 2020-when patient had low back surgery complicated by epidural hematoma.  He had a long hospitalization-requiring transfer to CIR and then to SNF.  Per daughter-since that time-patient  has unsteady gait and frequent falls along with frequency of urination/incontinence-and has been using a cane and walker.  He has had frequent falls since then as well.  Per patient's significant other-patient is scheduled to see a neurosurgeon/back surgeon at [facility] a second opinion (used to see Dr. Donzetta Sprung apparently patient needs repeat lumbar spine surgery for spinal stenosis.  Currently he is awake alert-and is easily able to lift both of his lower extremities off the bed against gravity and against resistance.  His sensation is grossly intact as well.  Orthostatics are negative  Since this is a chronic issue-further work-up will be deferred to the outpatient setting-family (daughter whom I spoke to on the phone) will reschedule patient's upcoming neurosurgical appointment.  Family is aware of the recommendation by PT for SNF.  DM-2: Difficult to control CBGs with episodes of hyper and hypoglycemia-CBGs relatively stable for the past day-for now continue with Levemir 22 units twice daily, 4 units of NovoLog with meals and SSI.  Follow and adjust.   CBG (last 3)  Recent Labs    04/15/19 1556 04/15/19 2034 04/16/19 0717  GLUCAP 222* 218* 80   HTN: Controlled-continue amlodipine, HCTZ, Avapro and metoprolol.     Dyslipidemia: Continue statin  BPH: Continue Flomax  Gout: No evidence of flare-continue allopurinol  Right lower lobe 5 mm pulmonary nodule: Repeat CT chest in 6-12 months  Obesity: Estimated body mass index is 30.26 kg/m as  calculated from the following:   Height as of this encounter: 5\' 11"  (1.803 m).   Weight as of this encounter: 98.4 kg.    Consults  :  None  Procedures  :  None  ABG: No results found for: PHART, PCO2ART, PO2ART, HCO3, TCO2, ACIDBASEDEF, O2SAT  Vent Settings: N/A  Condition - Stable  Family Communication  : Daughter over the phone on 11/28  Code Status :  Full Code  Diet :  Diet Order            Diet heart healthy/carb modified Room service appropriate? Yes; Fluid consistency: Thin  Diet effective now               Disposition Plan  :  Remain hospitalized-SNF tomorrow.  Barriers to discharge: Unsteady gait-deconditioning-needs SNF  Antimicorbials  :    Anti-infectives (From admission, onward)   Start     Dose/Rate Route Frequency Ordered Stop   04/13/19 1000  remdesivir 100 mg in sodium chloride 0.9 % 250 mL IVPB     100 mg 500 mL/hr over 30 Minutes Intravenous Daily 04/12/19 1619 04/14/19 1315      Inpatient Medications  Scheduled Meds: . diphenhydrAMINE  50 mg Oral QHS   And  . acetaminophen  1,000 mg Oral QHS  . allopurinol  100 mg Oral Daily  . amLODipine  5 mg Oral Daily  . atorvastatin  10 mg Oral Daily  . enoxaparin (LOVENOX) injection  50 mg Subcutaneous Q24H  . insulin aspart  0-20 Units Subcutaneous TID WC  . insulin aspart  0-5 Units Subcutaneous QHS  . insulin aspart  4 Units Subcutaneous TID WC  . insulin detemir  22 Units Subcutaneous BID  . irbesartan  75 mg Oral Daily  . magnesium oxide  400 mg Oral BID  . metoprolol succinate  100 mg Oral Daily  . pantoprazole  40 mg Oral QAC breakfast  . Ensure Max Protein  11 oz Oral BID  . tamsulosin  0.4 mg Oral BID  . vitamin C  500 mg Oral Daily   Continuous Infusions:  PRN Meds:.acetaminophen, guaiFENesin, polyethylene glycol  Time Spent in minutes  25  See all Orders from today for further details  Oren Binet M.D on 04/16/2019 at 11:44 AM  To page go to [email or web address] - use  universal password  [facility] -  Office  [phone]    Objective:   Vitals:   04/16/19 0412 04/16/19 0414 04/16/19 0715 04/16/19 1129  BP: (!) 127/93 (!) 106/46 120/73   Pulse: 79  72   Resp: 18  16   Temp: (!) 97.5 F (36.4 C)  97.9 F (36.6 C)   TempSrc: Oral  Oral   SpO2: 96%  97% 97%  Weight:      Height:        Wt Readings from Last 3 Encounters:  04/12/19 98.4 kg  02/18/19 99.3 kg  08/26/18 93.5 kg     Intake/Output Summary (Last 24 hours) at 04/16/2019 1144 Last data filed at 04/16/2019 0835 Gross per 24 hour  Intake 680 ml  Output 900 ml  Net -220 ml    Physical Exam Gen Exam:Alert awake-not in any distress HEENT:atraumatic, normocephalic Chest: B/L clear to auscultation anteriorly CVS:S1S2 regular Abdomen:soft non tender, non distended Extremities:no edema Neurology: Non focal Skin: no rash   Data Review:   CBC Recent Labs  Lab 04/12/19 1720 04/13/19 0150 04/14/19 0230 04/15/19 0255 04/16/19 0437  WBC 9.4 9.2 8.8 8.8 8.5  HGB 13.2 13.3 14.2 14.7 14.2  HCT 37.1* 37.9* 40.3 41.7 40.6  PLT 186 187 197 235 208  MCV 90.3 88.1 86.5 85.6 87.7  MCH 32.1 30.9 30.5 30.2 30.7  MCHC 35.6 35.1 35.2 35.3 35.0  RDW 11.6 11.4* 11.2* 11.2* 11.4*  LYMPHSABS 0.7 0.8 1.7 2.1 2.5  MONOABS 0.3 0.4 0.7 0.7 0.6  EOSABS 0.0 0.0 0.0 0.0 0.2  BASOSABS 0.0 0.0 0.0 0.0 0.0    Chemistries  Recent Labs  Lab 04/12/19 1720 04/12/19 2140 04/13/19 0150 04/14/19 0230 04/15/19 0255 04/16/19 0437  NA 138  --  138 138 140 141  K 4.3  --  4.0 3.4* 3.4* 3.8  CL 107  --  106 105 104 105  CO2 21*  --  22 25 25 27   GLUCOSE 405* 436* 378* 168* 76 81  BUN 36*  --  37* 37* 32* 34*  CREATININE 0.83  --  0.82 0.78 0.78 0.85  CALCIUM 9.6  --  9.8 10.1 9.9 10.1  AST 21  --  18 27 30  36  ALT 27  --  28 42 54* 78*  ALKPHOS 49  --  51 47 50 46  BILITOT 0.7  --  0.7 0.5 0.8 0.9    ------------------------------------------------------------------------------------------------------------------ No results for input(s): CHOL, HDL, LDLCALC, TRIG, CHOLHDL, LDLDIRECT in the last 72 hours.  Lab Results  Component Value Date   HGBA1C 9.7 (H) 04/12/2019   ------------------------------------------------------------------------------------------------------------------ No results for input(s): TSH, T4TOTAL, T3FREE, THYROIDAB in the last 72 hours.  Invalid input(s): FREET3 ------------------------------------------------------------------------------------------------------------------ No results for input(s): VITAMINB12, FOLATE, FERRITIN, TIBC, IRON, RETICCTPCT in the last 72 hours.  Coagulation profile No results for input(s): INR, PROTIME in the last 168 hours.  Recent Labs    04/15/19 0255 04/16/19 0437  DDIMER 0.67* 0.65*    Cardiac Enzymes No results for input(s): CKMB, TROPONINI, MYOGLOBIN in the last 168 hours.  Invalid input(s): CK ------------------------------------------------------------------------------------------------------------------ No results found for: BNP  Micro Results No results found for this or any previous visit (  from the past 240 hour(s)).  Radiology Reports No results found.

## 2019-04-17 LAB — CBC WITH DIFFERENTIAL/PLATELET
Abs Immature Granulocytes: 0.18 10*3/uL — ABNORMAL HIGH (ref 0.00–0.07)
Basophils Absolute: 0 10*3/uL (ref 0.0–0.1)
Basophils Relative: 1 %
Eosinophils Absolute: 0.3 10*3/uL (ref 0.0–0.5)
Eosinophils Relative: 4 %
HCT: 40 % (ref 39.0–52.0)
Hemoglobin: 14 g/dL (ref 13.0–17.0)
Immature Granulocytes: 2 %
Lymphocytes Relative: 34 %
Lymphs Abs: 2.9 10*3/uL (ref 0.7–4.0)
MCH: 30.6 pg (ref 26.0–34.0)
MCHC: 35 g/dL (ref 30.0–36.0)
MCV: 87.5 fL (ref 80.0–100.0)
Monocytes Absolute: 0.6 10*3/uL (ref 0.1–1.0)
Monocytes Relative: 7 %
Neutro Abs: 4.6 10*3/uL (ref 1.7–7.7)
Neutrophils Relative %: 52 %
Platelets: 216 10*3/uL (ref 150–400)
RBC: 4.57 MIL/uL (ref 4.22–5.81)
RDW: 11.4 % — ABNORMAL LOW (ref 11.5–15.5)
WBC: 8.7 10*3/uL (ref 4.0–10.5)
nRBC: 0 % (ref 0.0–0.2)

## 2019-04-17 LAB — COMPREHENSIVE METABOLIC PANEL
ALT: 76 U/L — ABNORMAL HIGH (ref 0–44)
AST: 32 U/L (ref 15–41)
Albumin: 2.8 g/dL — ABNORMAL LOW (ref 3.5–5.0)
Alkaline Phosphatase: 47 U/L (ref 38–126)
Anion gap: 7 (ref 5–15)
BUN: 30 mg/dL — ABNORMAL HIGH (ref 8–23)
CO2: 27 mmol/L (ref 22–32)
Calcium: 10.1 mg/dL (ref 8.9–10.3)
Chloride: 105 mmol/L (ref 98–111)
Creatinine, Ser: 0.93 mg/dL (ref 0.61–1.24)
GFR calc Af Amer: >60 mL/min (ref >60)
GFR calc non Af Amer: >60 mL/min (ref >60)
Glucose, Bld: 79 mg/dL (ref 70–99)
Potassium: 4.2 mmol/L (ref 3.5–5.1)
Sodium: 139 mmol/L (ref 135–145)
Total Bilirubin: 0.6 mg/dL (ref 0.3–1.2)
Total Protein: 5.8 g/dL — ABNORMAL LOW (ref 6.5–8.1)

## 2019-04-17 LAB — C-REACTIVE PROTEIN: CRP: 1.4 mg/dL — ABNORMAL HIGH (ref <1.0)

## 2019-04-17 LAB — D-DIMER, QUANTITATIVE: D-Dimer, Quant: 0.52 ug/mL-FEU — ABNORMAL HIGH (ref 0.00–0.50)

## 2019-04-17 LAB — GLUCOSE, CAPILLARY: Glucose-Capillary: 92 mg/dL (ref 70–99)

## 2019-04-17 MED ORDER — POLYETHYLENE GLYCOL 3350 17 G PO PACK: 17.0000 g | PACK | Freq: Every day | ORAL | 0 refills | Status: DC | PRN

## 2019-04-17 MED ORDER — NOVOLOG FLEXPEN 100 UNIT/ML ~~LOC~~ SOPN: PEN_INJECTOR | SUBCUTANEOUS | 0 refills | Status: DC

## 2019-04-17 MED ORDER — INSULIN DETEMIR 100 UNIT/ML ~~LOC~~ SOLN: 14.0000 [IU] | Freq: Two times a day (BID) | SUBCUTANEOUS | 11 refills | Status: DC

## 2019-04-17 MED ORDER — ENSURE MAX PROTEIN PO LIQD: 11.0000 [oz_av] | Freq: Two times a day (BID) | ORAL | Status: AC

## 2019-04-17 NOTE — Discharge Summary (Signed)
PATIENT DETAILS Name: William [name] Age: 68 y.o. Sex: male Date of Birth: 1950/11/19 MRN: [id number]. Admitting Physician: Thurnell Lose, MD [id number], Elyse Jarvis, MD  Admit Date: 04/12/2019 Discharge date: 04/17/2019  Recommendations for Outpatient Follow-up:  1. Follow up with PCP in 1-2 weeks 2. Please obtain CMP/CBC in one week 3. Repeat Chest Xray in 4-6 week 4. Please ensure follow up with neurosurgery 5. Repeat CT chest in 6-12 months-re pul nodule  Admitted From:  Home  Disposition: SNF   Home Health: No  Equipment/Devices: None  Discharge Condition: Stable  CODE STATUS: FULL CODE  Diet recommendation:  Diet Order            Diet - low sodium heart healthy        Diet Carb Modified        Diet heart healthy/carb modified Room service appropriate? Yes; Fluid consistency: Thin  Diet effective now               Brief Summary: See H&P, Labs, Consult and Test reports for all details in brief, Patient is a 68 y.o. 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  Brief Hospital Course:  Covid 19 Viral pneumonia: He is clinically stable-on room air-he has finished a course of remdesivir on 11/27.  Continue supportive care.  COVID-19 Labs:  Recent Labs    04/15/19 0255 04/16/19 0437 04/17/19 0153  DDIMER 0.67* 0.65* 0.52*  CRP 2.1* 2.4* 1.4*    No results found for: SARSCOV2NAA   Steroids: Not on steroids-due to lack of hypoxemia Remdesivir: 11/22>> 11/26 Actemra: Not given Convalescent Plasma: Not given  Acute metabolic encephalopathy: Suspect from COVID-19 pneumonia-he seems to have improved-he is able to answer most of my questions appropriately-although he is a poor historian.  CT at [facility] did not show any acute abnormalities.  Per patient's daughter who I spoke  to on 11/27-patient gets very inpatient-and purposefully gives wrong answers at times, and at times he gives wrong answers as a joke.  Frequent falls: This is an issue since February 2020-when patient had low back surgery complicated by epidural hematoma.  He had a long hospitalization-requiring transfer to CIR and then to SNF.  Per daughter-since that time-patient has unsteady gait and frequent falls along with frequency of urination/incontinence-and has been using a cane and walker.  He has had frequent falls since then as well.  Per patient's significant other-patient is scheduled to see a neurosurgeon/back surgeon at [facility] a second opinion (used to see Dr. Donzetta Sprung apparently patient needs repeat lumbar spine surgery for spinal stenosis.  Currently he is awake alert-and is easily able to lift both of his lower extremities off the bed against gravity and against resistance.  His sensation is grossly intact as well.  Orthostatics are negative  Since this is a chronic issue-further work-up will be deferred to the outpatient setting-family (daughter whom I spoke to on the phone) will reschedule patient's upcoming neurosurgical appointment.  Family is aware of the recommendation by PT for SNF.  DM-2 (A1C 9.7 on 11/24): Difficult to control CBGs with episodes of hyper and hypoglycemia-CBGs relatively stable for the past day-will decrease Levemir to 14 units BID, continue with SSI on discharge. Resume Metformin. Follow and adjust.   CBG (last 3)  Recent Labs    04/16/19 1611 04/16/19 2119 04/17/19 0740  GLUCAP 208*  173* 92   HTN: Controlled-continue amlodipine, HCTZ, Avapro and metoprolol.     Dyslipidemia: Continue statin  BPH: Continue Flomax  Gout: No evidence of flare-continue allopurinol  Right lower lobe 5 mm pulmonary nodule: Repeat CT chest in 6-12 months  Obesity: Estimated body mass index is 30.26 kg/m as calculated from the following:   Height as of this  encounter: 5\' 11"  (1.803 m).   Weight as of this encounter: 98.4 kg.   Nutrition Problem: Nutrition Problem: Increased nutrient needs Etiology: acute illness(COVID-19) Signs/Symptoms: estimated needs Interventions: Refer to RD note for recommendations  Obesity: Estimated body mass index is 30.26 kg/m as calculated from the following:   Height as of this encounter: 5\' 11"  (1.803 m).   Weight as of this encounter: 98.4 kg.    Procedures/Studies: None  Discharge Diagnoses:  Active Problems:   Diabetes mellitus type 2 in nonobese (HCC)   Abnormality of gait   Generalized weakness   AKI (acute kidney injury) ([place])   COVID-19 virus infection   Acute metabolic encephalopathy   Discharge Instructions:    Person Under Monitoring Name: William [name]  Location: [place] [address]   Infection Prevention Recommendations for Individuals Confirmed to have, or Being Evaluated for, 2019 Novel Coronavirus (COVID-19) Infection Who Receive Care at Home  Individuals who are confirmed to have, or are being evaluated for, COVID-19 should follow the prevention steps below until a healthcare provider or local or state health department says they can return to normal activities.  Stay home except to get medical care You should restrict activities outside your home, except for getting medical care. Do not go to work, school, or public areas, and do not use public transportation or taxis.  Call ahead before visiting your doctor Before your medical appointment, call the healthcare provider and tell them that you have, or are being evaluated for, COVID-19 infection. This will help the healthcare provider's office take steps to keep other people from getting infected. Ask your healthcare provider to call the local or state health department.  Monitor your symptoms Seek prompt medical attention if your illness is worsening (e.g., difficulty breathing). Before going to your  medical appointment, call the healthcare provider and tell them that you have, or are being evaluated for, COVID-19 infection. Ask your healthcare provider to call the local or state health department.  Wear a facemask You should wear a facemask that covers your nose and mouth when you are in the same room with other people and when you visit a healthcare provider. People who live with or visit you should also wear a facemask while they are in the same room with you.  Separate yourself from other people in your home As much as possible, you should stay in a different room from other people in your home. Also, you should use a separate bathroom, if available.  Avoid sharing household items You should not share dishes, drinking glasses, cups, eating utensils, towels, bedding, or other items with other people in your home. After using these items, you should wash them thoroughly with soap and water.  Cover your coughs and sneezes Cover your mouth and nose with a tissue when you cough or sneeze, or you can cough or sneeze into your sleeve. Throw used tissues in a lined trash can, and immediately wash your hands with soap and water for at least 20 seconds or use an alcohol-based hand rub.  Wash your [facility] your hands often and thoroughly  with soap and water for at least 20 seconds. You can use an alcohol-based hand sanitizer if soap and water are not available and if your hands are not visibly dirty. Avoid touching your eyes, nose, and mouth with unwashed hands.   Prevention Steps for Caregivers and Household Members of Individuals Confirmed to have, or Being Evaluated for, COVID-19 Infection Being Cared for in the Home  If you live with, or provide care at home for, a person confirmed to have, or being evaluated for, COVID-19 infection please follow these guidelines to prevent infection:  Follow healthcare provider's instructions Make sure that you understand and can help the  patient follow any healthcare provider instructions for all care.  Provide for the patient's basic needs You should help the patient with basic needs in the home and provide support for getting groceries, prescriptions, and other personal needs.  Monitor the patient's symptoms If they are getting sicker, call his or her medical provider and tell them that the patient has, or is being evaluated for, COVID-19 infection. This will help the healthcare provider's office take steps to keep other people from getting infected. Ask the healthcare provider to call the local or state health department.  Limit the number of people who have contact with the patient  If possible, have only one caregiver for the patient.  Other household members should stay in another home or place of residence. If this is not possible, they should stay  in another room, or be separated from the patient as much as possible. Use a separate bathroom, if available.  Restrict visitors who do not have an essential need to be in the home.  Keep older adults, very young children, and other sick people away from the patient Keep older adults, very young children, and those who have compromised immune systems or chronic health conditions away from the patient. This includes people with chronic heart, lung, or kidney conditions, diabetes, and cancer.  Ensure good ventilation Make sure that shared spaces in the home have good air flow, such as from an air conditioner or an opened window, weather permitting.  Wash your hands often  Wash your hands often and thoroughly with soap and water for at least 20 seconds. You can use an alcohol based hand sanitizer if soap and water are not available and if your hands are not visibly dirty.  Avoid touching your eyes, nose, and mouth with unwashed hands.  Use disposable paper towels to dry your hands. If not available, use dedicated cloth towels and replace them when they become wet.   Wear a facemask and gloves  Wear a disposable facemask at all times in the room and gloves when you touch or have contact with the patient's blood, body fluids, and/or secretions or excretions, such as sweat, saliva, sputum, nasal mucus, vomit, urine, or feces.  Ensure the mask fits over your nose and mouth tightly, and do not touch it during use.  Throw out disposable facemasks and gloves after using them. Do not reuse.  Wash your hands immediately after removing your facemask and gloves.  If your personal clothing becomes contaminated, carefully remove clothing and launder. Wash your hands after handling contaminated clothing.  Place all used disposable facemasks, gloves, and other waste in a lined container before disposing them with other household waste.  Remove gloves and wash your hands immediately after handling these items.  Do not share dishes, glasses, or other household items with the patient  Avoid sharing household items. You  should not share dishes, drinking glasses, cups, eating utensils, towels, bedding, or other items with a patient who is confirmed to have, or being evaluated for, COVID-19 infection.  After the person uses these items, you should wash them thoroughly with soap and water.  Wash laundry thoroughly  Immediately remove and wash clothes or bedding that have blood, body fluids, and/or secretions or excretions, such as sweat, saliva, sputum, nasal mucus, vomit, urine, or feces, on them.  Wear gloves when handling laundry from the patient.  Read and follow directions on labels of laundry or clothing items and detergent. In general, wash and dry with the warmest temperatures recommended on the label.  Clean all areas the individual has used often  Clean all touchable surfaces, such as counters, tabletops, doorknobs, bathroom fixtures, toilets, phones, keyboards, tablets, and bedside tables, every day. Also, clean any surfaces that may have blood, body fluids,  and/or secretions or excretions on them.  Wear gloves when cleaning surfaces the patient has come in contact with.  Use a diluted bleach solution (e.g., dilute bleach with 1 part bleach and 10 parts water) or a household disinfectant with a label that says EPA-registered for coronaviruses. To make a bleach solution at home, add 1 tablespoon of bleach to 1 quart (4 cups) of water. For a larger supply, add  cup of bleach to 1 gallon (16 cups) of water.  Read labels of cleaning products and follow recommendations provided on product labels. Labels contain instructions for safe and effective use of the cleaning product including precautions you should take when applying the product, such as wearing gloves or eye protection and making sure you have good ventilation during use of the product.  Remove gloves and wash hands immediately after cleaning.  Monitor yourself for signs and symptoms of illness Caregivers and household members are considered close contacts, should monitor their health, and will be asked to limit movement outside of the home to the extent possible. Follow the monitoring steps for close contacts listed on the symptom monitoring form.   ? If you have additional questions, contact your local health department or call the epidemiologist on call at [phone] (available 24/7). ? This guidance is subject to change. For the most up-to-date guidance from CDC, please refer to their website: YouBlogs.pl    Activity:  As tolerated with Full fall precautions use walker/cane & assistance as needed  Discharge Instructions    Call MD for:  difficulty breathing, headache or visual disturbances   Complete by: As directed    Call MD for:  extreme fatigue   Complete by: As directed    Call MD for:  persistant nausea and vomiting   Complete by: As directed    Diet - low sodium heart healthy   Complete by: As directed     Diet Carb Modified   Complete by: As directed    Discharge instructions   Complete by: As directed    Follow with Primary MD  Mateo Flow, MD in 1-2 weeks  Follow with back surgeon-Dr Rennis Harding  You have a right pulmonary nodule-please ask your primary MD to repeat CT Chest in 4-12 months, in some cases these nodules turn cancerous  Please get a complete blood count and chemistry panel checked by your Primary MD at your next visit, and again as instructed by your Primary MD.  Get Medicines reviewed and adjusted: Please take all your medications with you for your next visit with your Primary MD  Laboratory/radiological data:  Please request your Primary MD to go over all hospital tests and procedure/radiological results at the follow up, please ask your Primary MD to get all Hospital records sent to his/her office.  In some cases, they will be blood work, cultures and biopsy results pending at the time of your discharge. Please request that your primary care M.D. follows up on these results.  Also Note the following: If you experience worsening of your admission symptoms, develop shortness of breath, life threatening emergency, suicidal or homicidal thoughts you must seek medical attention immediately by calling 911 or calling your MD immediately  if symptoms less severe.  You must read complete instructions/literature along with all the possible adverse reactions/side effects for all the Medicines you take and that have been prescribed to you. Take any new Medicines after you have completely understood and accpet all the possible adverse reactions/side effects.   Do not drive when taking Pain medications or sleeping medications (Benzodaizepines)  Do not take more than prescribed Pain, Sleep and Anxiety Medications. It is not advisable to combine anxiety,sleep and pain medications without talking with your primary care practitioner  Special Instructions: If you have smoked or chewed  Tobacco  in the last 2 yrs please stop smoking, stop any regular Alcohol  and or any Recreational drug use.  Wear Seat belts while driving.  Please note: You were cared for by a hospitalist during your hospital stay. Once you are discharged, your primary care physician will handle any further medical issues. Please note that NO REFILLS for any discharge medications will be authorized once you are discharged, as it is imperative that you return to your primary care physician (or establish a relationship with a primary care physician if you do not have one) for your post hospital discharge needs so that they can reassess your need for medications and monitor your lab values.   Check CBG's ac and hs   Increase activity slowly   Complete by: As directed      Allergies as of 04/17/2019      Reactions   Sulfa Antibiotics Other (See Comments)   UNSPECIFIED REACTION    Sulfasalazine Dermatitis   UNSPECIFIED SEVERITY REACTION       Medication List    TAKE these medications   acetaminophen 325 MG tablet Commonly known as: TYLENOL Take 1-2 tablets (325-650 mg total) by mouth every 4 (four) hours as needed for mild pain.   allopurinol 100 MG tablet Commonly known as: ZYLOPRIM Take 100 mg by mouth daily.   amLODipine 5 MG tablet Commonly known as: NORVASC Take 5 mg by mouth daily.   amoxicillin 500 MG capsule Commonly known as: AMOXIL Take 2,000 mg by mouth See admin instructions. Before Dental procedures   atorvastatin 10 MG tablet Commonly known as: LIPITOR Take 10 mg by mouth daily.   Chelated Magnesium 100 MG Tabs Take 400 mg by mouth 2 (two) times daily.   CoQ10 30 MG Caps Take 1 capsule by mouth daily.   diphenhydramine-acetaminophen 25-500 MG Tabs tablet Commonly known as: TYLENOL PM Take 2 tablets by mouth at bedtime.   Ensure Max Protein Liqd Take 330 mLs (11 oz total) by mouth 2 (two) times daily.   Fish Oil 1200 MG Caps Take 2,400 mg by mouth 2 (two) times daily.    FLAXSEED OIL PO Take 2,000 mg by mouth 2 (two) times daily.   gabapentin 100 MG capsule Commonly known as: NEURONTIN Take 100 mg by mouth 3 (three) times daily.  hydrochlorothiazide 12.5 MG capsule Commonly known as: MICROZIDE Take 25 mg by mouth daily.   insulin detemir 100 UNIT/ML injection Commonly known as: LEVEMIR Inject 0.14 mLs (14 Units total) into the skin 2 (two) times daily.   loratadine 10 MG tablet Commonly known as: CLARITIN Take 10 mg by mouth daily.   meloxicam 15 MG tablet Commonly known as: MOBIC Take 15 mg by mouth daily as needed for pain.   metFORMIN 500 MG 24 hr tablet Commonly known as: GLUCOPHAGE-XR Take 1,000 mg by mouth every morning.   metoprolol succinate 100 MG 24 hr tablet Commonly known as: TOPROL-XL Take 100 mg by mouth daily.   multivitamin with minerals Tabs tablet Take 1 tablet by mouth daily.   NovoLOG FlexPen 100 UNIT/ML FlexPen Generic drug: insulin aspart 0-15 Units, Subcutaneous, 3 times daily with meals CBG < 70: implement hypoglycemia protocol-call MD CBG 70 - 120: 0 units CBG 121 - 150: 2 units CBG 151 - 200: 3 units CBG 201 - 250: 5 units CBG 251 - 300: 8 units CBG 301 - 350: 11 units CBG 351 - 400: 15 units CBG > 400:   pantoprazole 40 MG tablet Commonly known as: PROTONIX Take 1 tablet (40 mg total) by mouth daily before breakfast.   polyethylene glycol 17 g packet Commonly known as: MIRALAX / GLYCOLAX Take 17 g by mouth daily as needed for mild constipation.   polyvinyl alcohol 1.4 % ophthalmic solution Commonly known as: LIQUIFILM TEARS Place 2 drops into both eyes 3 (three) times daily as needed (for dry eyes).   tamsulosin 0.4 MG Caps capsule Commonly known as: FLOMAX Take 0.4 mg by mouth 2 (two) times daily.   telmisartan 40 MG tablet Commonly known as: MICARDIS Take 40 mg by mouth daily. for high blood pressure   Turmeric Curcumin 500 MG Caps Take 500 mg by mouth 2 (two) times daily.    vitamin C 500 MG tablet Commonly known as: ASCORBIC ACID Take 500 mg by mouth daily.       Contact information for follow-up providers    Mateo Flow, MD. Schedule an appointment as soon as possible for a visit in 2 week(s).   Specialty: Family Medicine Contact information: [address] [place] [address] [phone]        Starling Manns, MD. Schedule an appointment as soon as possible for a visit in 2 week(s).   Specialty: Orthopedic Surgery Contact information: [address] [place] [address] [phone]            Contact information for after-discharge care    Destination    [facility] .   Service: Skilled Nursing Contact information: [place] [address] [phone]                 Allergies  Allergen Reactions  . Sulfa Antibiotics Other (See Comments)    UNSPECIFIED REACTION   . Sulfasalazine Dermatitis    UNSPECIFIED SEVERITY REACTION     Consultations:   None   Other Procedures/Studies: No results found.   TODAY-DAY OF DISCHARGE:  Subjective:   William [name] today has no headache,no chest abdominal pain,no new weakness tingling or numbness, feels much better wants to go home today.   Objective:   Blood pressure (!) 149/59, pulse 77, temperature 97.9 F (36.6 C), temperature source Oral, resp. rate 18, height 5\' 11"  (1.803 m), weight 98.4 kg, SpO2 94 %.  Intake/Output Summary (Last 24  hours) at 04/17/2019 0913 Last data filed at 04/17/2019 0300 Gross per 24 hour  Intake 240 ml  Output 950 ml  Net -710 ml   Filed Weights   04/12/19 1603  Weight: 98.4 kg    Exam: Awake Alert, Oriented *3, No new F.N deficits, Normal affect [place].AT,PERRAL Supple Neck,No JVD, No cervical lymphadenopathy appriciated.  Symmetrical Chest wall movement, Good air movement bilaterally, CTAB RRR,No Gallops,Rubs or new Murmurs, No Parasternal Heave +ve B.Sounds, Abd Soft, Non  tender, No organomegaly appriciated, No rebound -guarding or rigidity. No Cyanosis, Clubbing or edema, No new Rash or bruise   PERTINENT RADIOLOGIC STUDIES: No results found.   PERTINENT LAB RESULTS: CBC: Recent Labs    04/16/19 0437 04/17/19 0153  WBC 8.5 8.7  HGB 14.2 14.0  HCT 40.6 40.0  PLT 208 216   CMET CMP     Component Value Date/Time   NA 139 04/17/2019 0153   K 4.2 04/17/2019 0153   CL 105 04/17/2019 0153   CO2 27 04/17/2019 0153   GLUCOSE 79 04/17/2019 0153   BUN 30 (H) 04/17/2019 0153   CREATININE 0.93 04/17/2019 0153   CALCIUM 10.1 04/17/2019 0153   PROT 5.8 (L) 04/17/2019 0153   ALBUMIN 2.8 (L) 04/17/2019 0153   AST 32 04/17/2019 0153   ALT 76 (H) 04/17/2019 0153   ALKPHOS 47 04/17/2019 0153   BILITOT 0.6 04/17/2019 0153   GFRNONAA >60 04/17/2019 0153   GFRAA >60 04/17/2019 0153    GFR Estimated Creatinine Clearance: 88.3 mL/min (by C-G formula based on SCr of 0.93 mg/dL). No results for input(s): LIPASE, AMYLASE in the last 72 hours. No results for input(s): CKTOTAL, CKMB, CKMBINDEX, TROPONINI in the last 72 hours. Invalid input(s): [place]    04/16/19 0437 04/17/19 0153  DDIMER 0.65* 0.52*   No results for input(s): HGBA1C in the last 72 hours. No results for input(s): CHOL, HDL, LDLCALC, TRIG, CHOLHDL, LDLDIRECT in the last 72 hours. No results for input(s): TSH, T4TOTAL, T3FREE, THYROIDAB in the last 72 hours.  Invalid input(s): FREET3 No results for input(s): VITAMINB12, FOLATE, FERRITIN, TIBC, IRON, RETICCTPCT in the last 72 hours. Coags: No results for input(s): INR in the last 72 hours.  Invalid input(s): PT Microbiology: No results found for this or any previous visit (from the past 240 hour(s)).  FURTHER DISCHARGE INSTRUCTIONS:  Get Medicines reviewed and adjusted: Please take all your medications with you for your next visit with your Primary MD  Laboratory/radiological data: Please request your Primary MD to  go over all hospital tests and procedure/radiological results at the follow up, please ask your Primary MD to get all Hospital records sent to his/her office.  In some cases, they will be blood work, cultures and biopsy results pending at the time of your discharge. Please request that your primary care M.D. goes through all the records of your hospital data and follows up on these results.  Also Note the following: If you experience worsening of your admission symptoms, develop shortness of breath, life threatening emergency, suicidal or homicidal thoughts you must seek medical attention immediately by calling 911 or calling your MD immediately  if symptoms less severe.  You must read complete instructions/literature along with all the possible adverse reactions/side effects for all the Medicines you take and that have been prescribed to you. Take any new Medicines after you have completely understood and accpet all the possible adverse reactions/side effects.   Do not drive when taking Pain medications or  sleeping medications (Benzodaizepines)  Do not take more than prescribed Pain, Sleep and Anxiety Medications. It is not advisable to combine anxiety,sleep and pain medications without talking with your primary care practitioner  Special Instructions: If you have smoked or chewed Tobacco  in the last 2 yrs please stop smoking, stop any regular Alcohol  and or any Recreational drug use.  Wear Seat belts while driving.  Please note: You were cared for by a hospitalist during your hospital stay. Once you are discharged, your primary care physician will handle any further medical issues. Please note that NO REFILLS for any discharge medications will be authorized once you are discharged, as it is imperative that you return to your primary care physician (or establish a relationship with a primary care physician if you do not have one) for your post hospital discharge needs so that they can reassess  your need for medications and monitor your lab values.  Total Time spent coordinating discharge including counseling, education and face to face time equals 35 minutes.  SignedOren Binet 04/17/2019 9:13 AM

## 2019-04-17 NOTE — TOC Transition Note (Signed)
Transition of Care [facility]) - CM/SW Discharge Note   Patient Details  Name: William [name] MRN: [id number] Date of Birth: [birth year]  Transition of Care [facility]) CM/SW Contact:  Alberteen Sam, LCSW Phone Number: 04/17/2019, 9:25 AM   Clinical Narrative:     Patient will DC to: [facility] Anticipated DC date: 04/17/2019 Family notified: Cecille Rubin (daughter) Transport [id number]  Per MD patient ready for DC to [facility] . RN, patient, patient's family, and facility notified of DC. Discharge Summary sent to facility. RN given number for report  [phone] patient will go to [facility]. DC packet on chart. Ambulance transport requested for patient.  CSW signing off.  [place], [place]   Final next level of care: Skilled Nursing Facility Barriers to Discharge: No Barriers Identified   Patient Goals and CMS Choice Patient states their goals for this hospitalization and ongoing recovery are:: per daughter for patient to get better CMS Medicare.gov Compare Post Acute Care list provided to:: Patient Represenative (must comment)(daughter Cecille Rubin) Choice offered to / list presented to : Adult Children  Discharge Placement PASRR number recieved: 04/15/19            Patient chooses bed at: [facility] Patient to be transferred to facility by: [facility] Name of family member notified: Cecille Rubin Patient and family notified of of transfer: 04/17/19  Discharge Plan and Services In-house Referral: NA Discharge Planning Services: CM Consult Post Acute Care Choice: [place]          DME Arranged: N/A         HH Arranged: NA [place] Agency: NA        Social Determinants of Health (SDOH) Interventions     Readmission Risk Interventions No flowsheet data found.

## 2019-04-17 NOTE — Plan of Care (Signed)

## 2019-04-17 NOTE — Plan of Care (Signed)
  Problem: Education: Goal: Knowledge of risk factors and measures for prevention of condition will improve 04/17/2019 1147 by Levie Heritage, RN Outcome: Adequate for Discharge 04/17/2019 1035 by Levie Heritage, RN Outcome: Progressing   Problem: Coping: Goal: Psychosocial and spiritual needs will be supported 04/17/2019 1147 by Levie Heritage, RN Outcome: Adequate for Discharge 04/17/2019 1035 by Levie Heritage, RN Outcome: Progressing   Problem: Respiratory: Goal: Will maintain a patent airway 04/17/2019 1147 by Levie Heritage, RN Outcome: Adequate for Discharge 04/17/2019 1035 by Levie Heritage, RN Outcome: Progressing Goal: Complications related to the disease process, condition or treatment will be avoided or minimized 04/17/2019 1147 by Levie Heritage, RN Outcome: Adequate for Discharge 04/17/2019 1035 by Levie Heritage, RN Outcome: Progressing   Problem: Education: Goal: Knowledge of General Education information will improve Description: Including pain rating scale, medication(s)/side effects and non-pharmacologic comfort measures 04/17/2019 1147 by Levie Heritage, RN Outcome: Adequate for Discharge 04/17/2019 1035 by Levie Heritage, RN Outcome: Progressing   Problem: Health Behavior/Discharge Planning: Goal: Ability to manage health-related needs will improve 04/17/2019 1147 by Levie Heritage, RN Outcome: Adequate for Discharge 04/17/2019 1035 by Levie Heritage, RN Outcome: Progressing   Problem: Clinical Measurements: Goal: Ability to maintain clinical measurements within normal limits will improve 04/17/2019 1147 by Levie Heritage, RN Outcome: Adequate for Discharge 04/17/2019 1035 by Levie Heritage, RN Outcome: Progressing Goal: Will remain free from infection 04/17/2019 1147 by Levie Heritage, RN Outcome: Adequate for Discharge 04/17/2019 1035 by Levie Heritage, RN Outcome: Progressing Goal: Diagnostic test results will improve 04/17/2019 1147 by Levie Heritage,  RN Outcome: Adequate for Discharge 04/17/2019 1035 by Levie Heritage, RN Outcome: Progressing Goal: Respiratory complications will improve 04/17/2019 1147 by Levie Heritage, RN Outcome: Adequate for Discharge 04/17/2019 1035 by Levie Heritage, RN Outcome: Progressing Goal: Cardiovascular complication will be avoided 04/17/2019 1147 by Levie Heritage, RN Outcome: Adequate for Discharge 04/17/2019 1035 by Levie Heritage, RN Outcome: Progressing   Problem: Activity: Goal: Risk for activity intolerance will decrease 04/17/2019 1147 by Levie Heritage, RN Outcome: Adequate for Discharge 04/17/2019 1035 by Levie Heritage, RN Outcome: Progressing   Problem: Nutrition: Goal: Adequate nutrition will be maintained 04/17/2019 1147 by Levie Heritage, RN Outcome: Adequate for Discharge 04/17/2019 1035 by Levie Heritage, RN Outcome: Progressing   Problem: Coping: Goal: Level of anxiety will decrease 04/17/2019 1147 by Levie Heritage, RN Outcome: Adequate for Discharge 04/17/2019 1035 by Levie Heritage, RN Outcome: Progressing   Problem: Elimination: Goal: Will not experience complications related to bowel motility 04/17/2019 1147 by Levie Heritage, RN Outcome: Adequate for Discharge 04/17/2019 1035 by Levie Heritage, RN Outcome: Progressing Goal: Will not experience complications related to urinary retention 04/17/2019 1147 by Levie Heritage, RN Outcome: Adequate for Discharge 04/17/2019 1035 by Levie Heritage, RN Outcome: Progressing   Problem: Pain Managment: Goal: General experience of comfort will improve 04/17/2019 1147 by Levie Heritage, RN Outcome: Adequate for Discharge 04/17/2019 1035 by Levie Heritage, RN Outcome: Progressing   Problem: Safety: Goal: Ability to remain free from injury will improve 04/17/2019 1147 by Levie Heritage, RN Outcome: Adequate for Discharge 04/17/2019 1035 by Levie Heritage, RN Outcome: Progressing   Problem: Skin Integrity: Goal: Risk for impaired  skin integrity will decrease 04/17/2019 1147 by Levie Heritage, RN Outcome: Adequate for Discharge 04/17/2019 1035 by Levie Heritage, RN Outcome: Progressing

## 2019-04-17 NOTE — Progress Notes (Signed)
Report given to [facility] and patient transported to [facility].

## 2019-04-17 NOTE — Progress Notes (Signed)
Report given to Santiago Glad, LPN at [facility] after several unsuccessful call attempts. No further questions.

## 2019-09-26 ENCOUNTER — Ambulatory Visit: Payer: Medicare Other | Admitting: Diagnostic Neuroimaging

## 2020-01-20 LAB — CBC WITH DIFFERENTIAL/PLATELET
Basophils Absolute: 0 10*3/uL (ref 0.0–0.1)
Basophils Relative: 0 %
Eosinophils Absolute: 0 10*3/uL (ref 0.0–0.5)
Eosinophils Relative: 0 %
HCT: 37.9 % — ABNORMAL LOW (ref 39.0–52.0)
Hemoglobin: 13.3 g/dL (ref 13.0–17.0)
Lymphocytes Relative: 9 %
Lymphs Abs: 0.8 10*3/uL (ref 0.7–4.0)
MCH: 30.9 pg (ref 26.0–34.0)
MCHC: 35.1 g/dL (ref 30.0–36.0)
MCV: 88.1 fL (ref 80.0–100.0)
Monocytes Absolute: 0.4 10*3/uL (ref 0.1–1.0)
Monocytes Relative: 4 %
Neutro Abs: 7.9 10*3/uL — ABNORMAL HIGH (ref 1.7–7.7)
Neutrophils Relative %: 86 %
Platelets: 187 10*3/uL (ref 150–400)
RBC: 4.3 MIL/uL (ref 4.22–5.81)
RDW: 11.4 % — ABNORMAL LOW (ref 11.5–15.5)
WBC: 9.2 10*3/uL (ref 4.0–10.5)
nRBC: 0 % (ref 0.0–0.2)
nRBC: 0 /100 WBC

## 2020-03-23 DIAGNOSIS — I6389 Other cerebral infarction: Secondary | ICD-10-CM | POA: Diagnosis not present

## 2020-04-20 ENCOUNTER — Encounter: Payer: Self-pay | Admitting: Neurology

## 2020-05-30 NOTE — Progress Notes (Signed)
NEUROLOGY CONSULTATION NOTE  William [name] MRN: [id number] DOB: 05-06-1951  Referring provider: Bertram Millard, MD Primary care provider: Bertram Millard, MD  Reason for consult:  hydrocephalus   Subjective:  William [name] is a 70 year old right-handed male with HTN, OSA, lumbar spinal stenosis and history of bladder cancer and left-sided Bell's palsy who presents for hydrocephalus.  She is accompanied by her daughter who provides collateral history.  History also supplemented by hospital records and referring provider's note.  He was admitted to [facility] in November for dizziness and unwitnessed fall at home.  CT head showed evidence of ventriculomegaly out of proportion to cerebral atrophy, possibly indicative of NPH.  MRI of brain showed no acute infarct.  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  2D echo showed EF 60-65%.  Dizziness was thought to possibly be due to a reaction from the COVID booster.  Due to CT findings, it was recommended that he be evaluated for possible NPH.  He has history of falls.  He has had knee surgery.  He also has lumbar spinal stenosis with 2 back surgeries and continues to get injections.  He is independent.  He lives alone, drives short distances without problems and manages his finances.  He denies memory problems.  He has history of bladder cancer and has urinary retention.  Other than once in awhile, he denies urinary incontinence.  He is starting physical therapy tomorrow.    Prior MRI of brain without contrast on 08/26/2019 showed ventriculomegaly increased since 2013 with no transependymal edema but possible hyperdynamic flow in the cerebral aqueduct.  He did have a previous head CT on 07/13/2018, which was personally reviewed and did demonstrate ventricular dilatation.  PAST MEDICAL HISTORY: Past Medical History:  Diagnosis Date  . Arthritis    OA- knees   . Bell's palsy 2014  . Bladder cancer ([place])   . BPH (benign  prostatic hyperplasia)   . GERD (gastroesophageal reflux disease)   . Gout   . Hypertension   . Sleep apnea    CPAP-in use q night, last study 5 yrs. ago  . Spinal stenosis of lumbar region with radiculopathy     PAST SURGICAL HISTORY: Past Surgical History:  Procedure Laterality Date  . BLADDER TUMOR EXCISION  2017   several cystocscopy for the excision followed by M. Annitta Needs  . BLEPHAROPLASTY Bilateral 2017   in [place]  . CYSTOSCOPY    . HEMATOMA EVACUATION N/A 07/04/2018   Procedure: Lumbar wound exploration and EVACUATION OF EPIDURAL HEMATOMA;  Surgeon: Newman Pies, MD;  Location: [facility];  Service: Neurosurgery;  Laterality: N/A;  . JOINT REPLACEMENT    . KNEE ARTHROSCOPY     2 on each side, one being a repair of the meniscus     . LUMBAR LAMINECTOMY/DECOMPRESSION MICRODISCECTOMY N/A 02/24/2018   Procedure: Lumbar Four-Five Laminectomy/Foraminotomy;  Surgeon: Ashok Pall, MD;  Location: [facility];  Service: Neurosurgery;  Laterality: N/A;  . SHOULDER ARTHROSCOPY Right 2016  . TONSILLECTOMY    . TOTAL KNEE ARTHROPLASTY Right 12/08/2016   Procedure: TOTAL KNEE ARTHROPLASTY;  Surgeon: Vickey Huger, MD;  Location: [facility];  Service: Orthopedics;  Laterality: Right;    MEDICATIONS: Current Outpatient Medications on File Prior to Visit  Medication Sig Dispense Refill  . acetaminophen (TYLENOL) 325 MG tablet Take 1-2 tablets (325-650 mg total) by mouth every 4 (four) hours as needed for mild pain.    Marland Kitchen allopurinol (ZYLOPRIM) 100  MG tablet Take 100 mg by mouth daily.    Marland Kitchen amLODipine (NORVASC) 5 MG tablet Take 5 mg by mouth daily.   3  . amoxicillin (AMOXIL) 500 MG capsule Take 2,000 mg by mouth See admin instructions. Before Dental procedures    . atorvastatin (LIPITOR) 10 MG tablet Take 10 mg by mouth daily.  1  . Chelated Magnesium 100 MG TABS Take 400 mg by mouth 2 (two) times daily.    . Coenzyme Q10 (COQ10) 30 MG CAPS Take 1 capsule by mouth daily.    .  diphenhydramine-acetaminophen (TYLENOL PM) 25-500 MG TABS tablet Take 2 tablets by mouth at bedtime.    . Ensure Max Protein (ENSURE MAX PROTEIN) LIQD Take 330 mLs (11 oz total) by mouth 2 (two) times daily.    . Flaxseed, Linseed, (FLAXSEED OIL PO) Take 2,000 mg by mouth 2 (two) times daily.     Marland Kitchen gabapentin (NEURONTIN) 100 MG capsule Take 100 mg by mouth 3 (three) times daily.    . hydrochlorothiazide (MICROZIDE) 12.5 MG capsule Take 25 mg by mouth daily.     . insulin aspart (NOVOLOG FLEXPEN) 100 UNIT/ML FlexPen 0-15 Units, Subcutaneous, 3 times daily with meals CBG < 70: implement hypoglycemia protocol-call MD CBG 70 - 120: 0 units CBG 121 - 150: 2 units CBG 151 - 200: 3 units CBG 201 - 250: 5 units CBG 251 - 300: 8 units CBG 301 - 350: 11 units CBG 351 - 400: 15 units CBG > 400: 15 mL 0  . insulin detemir (LEVEMIR) 100 UNIT/ML injection Inject 0.14 mLs (14 Units total) into the skin 2 (two) times daily. 10 mL 11  . loratadine (CLARITIN) 10 MG tablet Take 10 mg by mouth daily.    . meloxicam (MOBIC) 15 MG tablet Take 15 mg by mouth daily as needed for pain.     . metFORMIN (GLUCOPHAGE-XR) 500 MG 24 hr tablet Take 1,000 mg by mouth every morning.    . metoprolol succinate (TOPROL-XL) 100 MG 24 hr tablet Take 100 mg by mouth daily.  4  . Multiple Vitamin (MULTIVITAMIN WITH MINERALS) TABS tablet Take 1 tablet by mouth daily.    . Omega-3 Fatty Acids (FISH OIL) 1200 MG CAPS Take 2,400 mg by mouth 2 (two) times daily.     . pantoprazole (PROTONIX) 40 MG tablet Take 1 tablet (40 mg total) by mouth daily before breakfast.  3  . polyethylene glycol (MIRALAX / GLYCOLAX) 17 g packet Take 17 g by mouth daily as needed for mild constipation. 14 each 0  . polyvinyl alcohol (LIQUIFILM TEARS) 1.4 % ophthalmic solution Place 2 drops into both eyes 3 (three) times daily as needed (for dry eyes). 15 mL 0  . tamsulosin (FLOMAX) 0.4 MG CAPS capsule Take 0.4 mg by mouth 2 (two) times daily.  3  .  telmisartan (MICARDIS) 40 MG tablet Take 40 mg by mouth daily. for high blood pressure    . Turmeric Curcumin 500 MG CAPS Take 500 mg by mouth 2 (two) times daily.    . vitamin C (ASCORBIC ACID) 500 MG tablet Take 500 mg by mouth daily.     No current facility-administered medications on file prior to visit.    ALLERGIES: Allergies  Allergen Reactions  . Sulfa Antibiotics Other (See Comments)    UNSPECIFIED REACTION   . Sulfasalazine Dermatitis    UNSPECIFIED SEVERITY REACTION     FAMILY HISTORY: Family History  Problem Relation Age of Onset  .  Cancer Mother   . Congestive Heart Failure Father     SOCIAL HISTORY: Social History   Socioeconomic History  . Marital status: Significant Other    Spouse name: Not on file  . Number of children: Not on file  . Years of education: Not on file  . Highest education level: Not on file  Occupational History  . Not on file  Tobacco Use  . Smoking status: Former Smoker    Quit date: 08/29/2002    Years since quitting: 17.7  . Smokeless tobacco: Never Used  Vaping Use  . Vaping Use: Never used  Substance and Sexual Activity  . Alcohol use: Not Currently    Alcohol/week: 2.0 - 3.0 standard drinks    Types: 2 - 3 Shots of liquor per week    Comment: states drinks non-alcoholic beer and fireball once in a while  . Drug use: No  . Sexual activity: Yes  Other Topics Concern  . Not on file  Social History Narrative  . Not on file   Social Determinants of Health   Financial Resource Strain: Not on file  Food Insecurity: Not on file  Transportation Needs: Not on file  Physical Activity: Not on file  Stress: Not on file  Social Connections: Not on file  Intimate Partner Violence: Not on file    Objective:  Blood pressure (!) 191/99, pulse 82, height 5\' 11"  (1.803 m), weight 213 lb 9.6 oz (96.9 kg), SpO2 96 %. General: No acute distress.  Patient appears well-groomed.   Head:  Normocephalic/atraumatic Eyes:  fundi examined  but not visualized Neck: supple, no paraspinal tenderness, full range of motion Back: No paraspinal tenderness Heart: regular rate and rhythm Lungs: Clear to auscultation bilaterally. Vascular: No carotid bruits. Neurological Exam: Mental status: alert and oriented to person, place, and time, recent and remote memory intact, fund of knowledge intact, attention and concentration intact, speech fluent and not dysarthric, language intact. Cranial nerves: CN I: not tested CN II: pupils equal, round and reactive to light, visual fields intact CN III, IV, VI:  full range of motion, no nystagmus, no ptosis CN V: facial sensation intact. CN VII: upper and lower face symmetric CN VIII: hearing intact CN IX, X: gag intact, uvula midline CN XI: sternocleidomastoid and trapezius muscles intact CN XII: tongue midline Bulk & Tone: normal, no fasciculations. Motor:  muscle strength 5/5 throughout Sensation:  Pinprick, temperature and vibratory sensation intact. Deep Tendon Reflexes:  2+ throughout,  toes downgoing.   Finger to nose testing:  Without dysmetria.   Heel to shin:  Without dysmetria.   Gait:  Normal station and stride.  Romberg negative.  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  He does not exhibit any significant cognitive impairment or urinary incontinence.  He does have unsteady gait but that may be attributed to history of lumbar spinal stenosis with underlying peripheral neuropathy.  Given his history of recurrent falls, we will evaluate for NPH.  1.  We will schedule for diagnostic large volume lumbar puncture to assess for possible NPH 2.  Physical therapy 3.  Follow up after testing.    Thank you for allowing me to take part in the care of this patient.  Metta Clines, DO  CC: Bertram Millard, MD

## 2020-05-31 ENCOUNTER — Other Ambulatory Visit: Payer: Self-pay

## 2020-05-31 ENCOUNTER — Ambulatory Visit: Payer: Federal, State, Local not specified - PPO | Admitting: Neurology

## 2020-05-31 ENCOUNTER — Encounter: Payer: Self-pay | Admitting: Neurology

## 2020-05-31 VITALS — BP 191/99 | HR 82 | Ht 71.0 in | Wt 213.6 lb

## 2020-05-31 DIAGNOSIS — R2681 Unsteadiness on feet: Secondary | ICD-10-CM

## 2020-05-31 DIAGNOSIS — G9389 Other specified disorders of brain: Secondary | ICD-10-CM

## 2020-05-31 DIAGNOSIS — M48061 Spinal stenosis, lumbar region without neurogenic claudication: Secondary | ICD-10-CM

## 2020-05-31 DIAGNOSIS — M5416 Radiculopathy, lumbar region: Secondary | ICD-10-CM

## 2020-05-31 NOTE — Patient Instructions (Addendum)
1.  We will set up for spinal tap with evaluation of walking and balance to assess for possible normal pressure hydrocephalus 2.  Continue physical therapy, [address], [place], [place] [address]  Phone: [phone] Fax: [phone] 3.  Follow up after testing

## 2020-06-01 ENCOUNTER — Telehealth: Payer: Self-pay

## 2020-06-01 NOTE — Telephone Encounter (Signed)
Spinal tap scheduled for 06/06/20 at 10 am, Pt is to arrive at 8:30 am At the [place] at [facility]. 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 Only one person in the waiting room.

## 2020-06-04 ENCOUNTER — Other Ambulatory Visit: Payer: Self-pay | Admitting: Student

## 2020-06-04 NOTE — Telephone Encounter (Signed)
Telephone call to pt to insure pt got our message in regard to his LP.   Per pt he received our a call as well the call from the hospital.  Pt may change his LP appt time and date due to the weather. He stay in [place]. Roads are not safe for him and his wife to travel to [place].

## 2020-06-06 ENCOUNTER — Other Ambulatory Visit: Payer: Self-pay

## 2020-06-06 ENCOUNTER — Ambulatory Visit (HOSPITAL_COMMUNITY): Payer: Federal, State, Local not specified - PPO

## 2020-06-06 ENCOUNTER — Ambulatory Visit (HOSPITAL_COMMUNITY)
Admission: RE | Admit: 2020-06-06 | Discharge: 2020-06-06 | Disposition: A | Discharge status: Home / Self Care | Payer: Federal, State, Local not specified - PPO | Source: Ambulatory Visit | Attending: Neurology | Admitting: Neurology

## 2020-06-06 DIAGNOSIS — G9389 Other specified disorders of brain: Secondary | ICD-10-CM | POA: Insufficient documentation

## 2020-06-06 LAB — PROTEIN, CSF: Total  Protein, CSF: 37 mg/dL (ref 15–45)

## 2020-06-06 LAB — GLUCOSE, CAPILLARY: Glucose-Capillary: 162 mg/dL — ABNORMAL HIGH (ref 70–99)

## 2020-06-06 LAB — CSF CELL COUNT WITH DIFFERENTIAL
RBC Count, CSF: 0 /mm3
Tube #: 3
WBC, CSF: 1 /mm3 (ref 0–5)

## 2020-06-06 LAB — GLUCOSE, CSF: Glucose, CSF: 89 mg/dL — ABNORMAL HIGH (ref 40–70)

## 2020-06-06 MED ORDER — LIDOCAINE HCL (PF) 1 % IJ SOLN
5.0000 mL | Freq: Once | INTRAMUSCULAR | Status: AC
  Administered 2020-06-06: 5 mL via INTRADERMAL

## 2020-06-06 NOTE — Discharge Instructions (Signed)
Lumbar Puncture, Care After This sheet gives you information about how to care for yourself after your procedure. Your health care provider may also give you more specific instructions. If you have problems or questions, contact your health care provider. What can I expect after the procedure? After the procedure, it is common to have:  Mild discomfort or pain at the puncture site.  A mild headache that is relieved with pain medicines. Follow these instructions at home: Activity  Lie down flat or rest for as long as directed by your health care provider.  Return to your normal activities as told by your health care provider. Ask your health care provider what activities are safe for you.  Avoid lifting anything heavier than 10 lb (4.5 kg) for at least 12 hours after the procedure.  Do not drive for 24 hours if you were given a medicine to help you relax (sedative) during your procedure.  Do not drive or use heavy machinery while taking prescription pain medicine.   Puncture site care  Remove or change your bandage (dressing) as told by your health care provider.  Check your puncture area every day for signs of infection. Check for: ? More pain. ? Redness or swelling. ? Fluid or blood leaking from the puncture site. ? Warmth. ? Pus or a bad smell. General instructions  Take over-the-counter and prescription medicines only as told by your health care provider.  Drink enough fluids to keep your urine clear or pale yellow. Your health care provider may recommend drinking caffeine to prevent a headache.  Keep all follow-up visits as told by your health care provider. This is important. Contact a health care provider if:  You have fever or chills.  You have nausea or vomiting.  You have a headache that lasts for more than 2 days or does not get better with medicine. Get help right away if:  You develop any of the following in your  legs: ? Weakness. ? Numbness. ? Tingling.  You are unable to control when you urinate or have a bowel movement (incontinence).  You have signs of infection around your puncture site, such as: ? More pain. ? Redness or swelling. ? Fluid or blood leakage. ? Warmth. ? Pus or a bad smell.  You are dizzy or you feel like you might faint.  You have a severe headache, especially when you sit or stand. Summary  A lumbar puncture is a procedure in which a small needle is inserted into the lower back to remove fluid that surrounds the brain and spinal cord.  After this procedure, it is common to have a headache and pain around the needle insertion area.  Lying flat, staying hydrated, and drinking caffeine can help prevent headaches.  Monitor your needle insertion site for signs of infection, including warmth, fluid, or more pain.  Get help right away if you develop leg weakness, leg numbness, incontinence, or severe headaches. This information is not intended to replace advice given to you by your health care provider. Make sure you discuss any questions you have with your health care provider. Document Revised: 03/15/2020 Document Reviewed: 03/15/2020 Elsevier Patient Education  2021 Elsevier Inc.  

## 2020-06-11 ENCOUNTER — Other Ambulatory Visit: Payer: Self-pay | Admitting: Neurology

## 2020-06-11 ENCOUNTER — Telehealth: Payer: Self-pay | Admitting: Neurology

## 2020-06-11 DIAGNOSIS — G9389 Other specified disorders of brain: Secondary | ICD-10-CM

## 2020-06-11 NOTE — Telephone Encounter (Signed)
I spoke with William [name] via phone.  Reviewed note from physical therapist at last week's NPH assessment.  He exhibited significant improvement in post-LP testing.  The patient also reports noticeable improvement as well.  Will refer to [facility] neurosurgery for further evaluation to assess candidacy for shunt.

## 2020-06-11 NOTE — Addendum Note (Signed)
Addended by: Venetia Night on: 06/11/2020 02:02 PM   Modules accepted: Orders

## 2020-06-11 NOTE — Telephone Encounter (Signed)
Referral added

## 2020-07-03 IMAGING — CT CT HEAD W/O CM
3 series · 14 of 47 positions shown, 16 images · non-contrast
Comparison: 06/11/2012

CLINICAL DATA: Headache after a fall.

EXAM:
CT HEAD WITHOUT CONTRAST
TECHNIQUE: Contiguous axial images were obtained from the base of the skull
through the vertex without intravenous contrast.

[Series 3: head 5.0 h30s · axial · 0.45mm/px · z∈[-80,+65]mm · 8 of 35 slices shown, 10 images]
[im 3/35  brain]
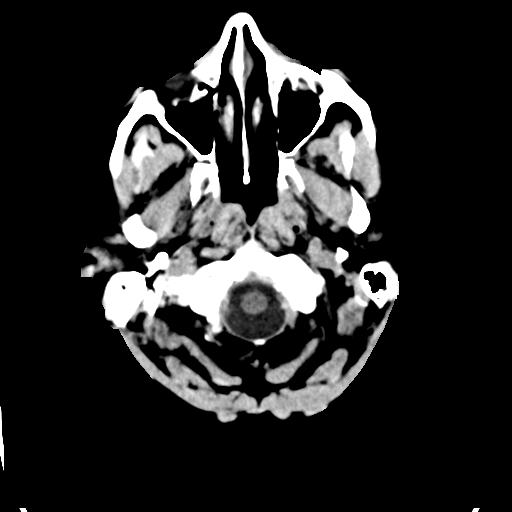
[im 3/35  bone]
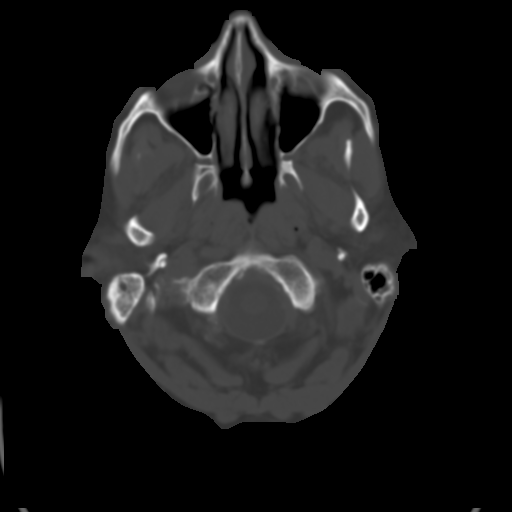
[im 8/35  brain]
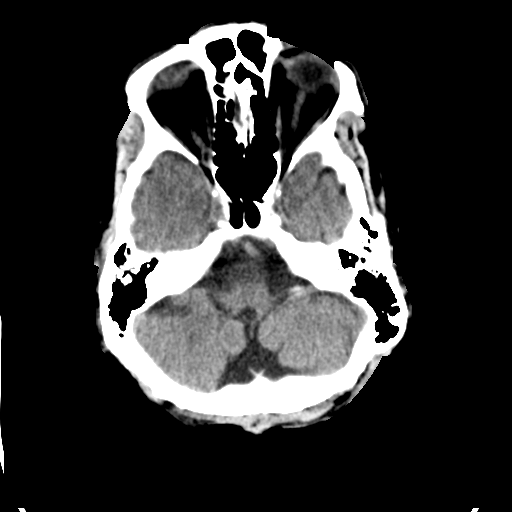
[im 11/35  brain]
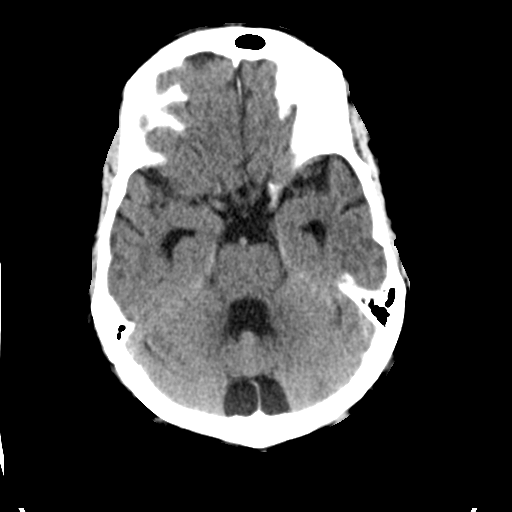
[im 16/35  brain]
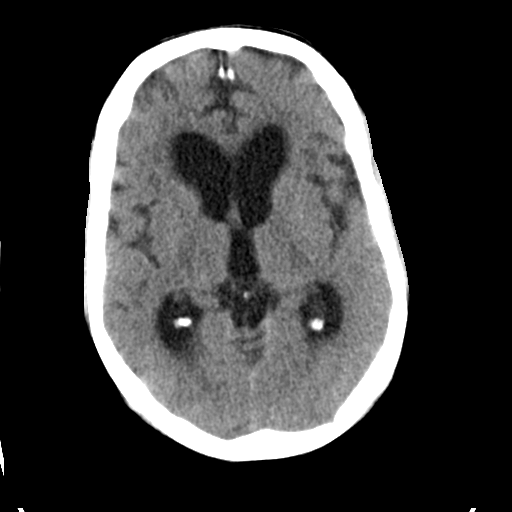
[im 19/35  brain]
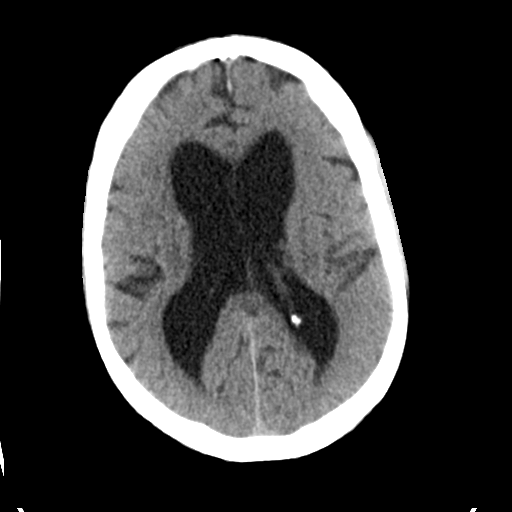
[im 19/35  bone]
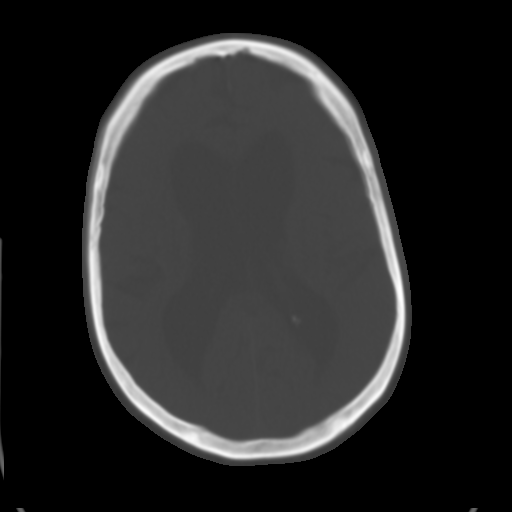
[im 24/35  brain]
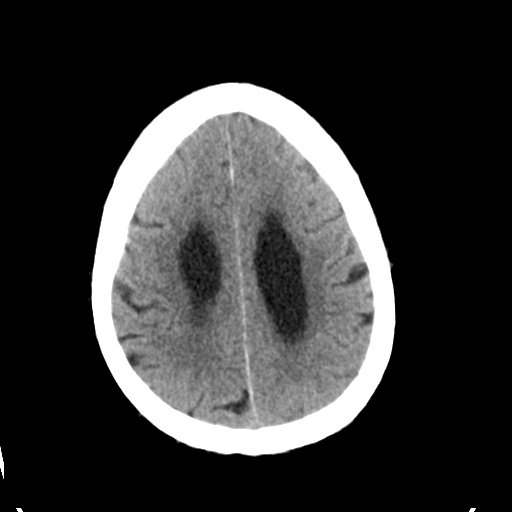
[im 27/35  brain]
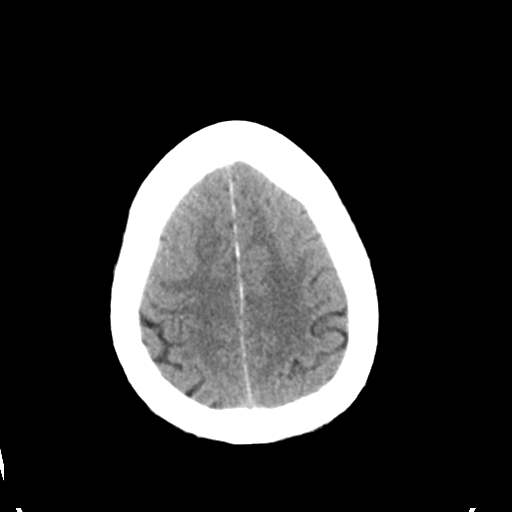
[im 32/35  brain]
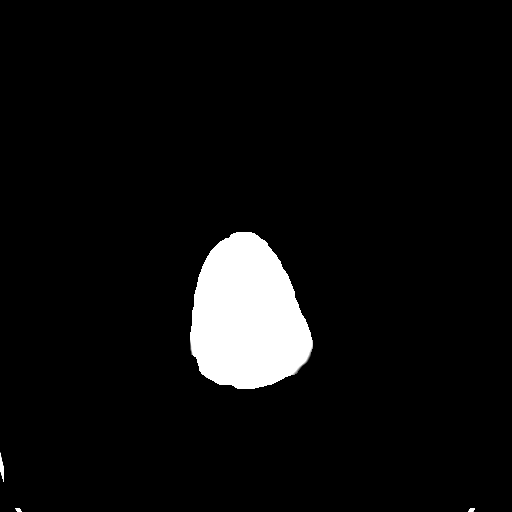

[Series 5: head 3.0 mpr cor · coronal · 0.34mm/px · 3 of 76 slices shown]
[im 26/76  brain]
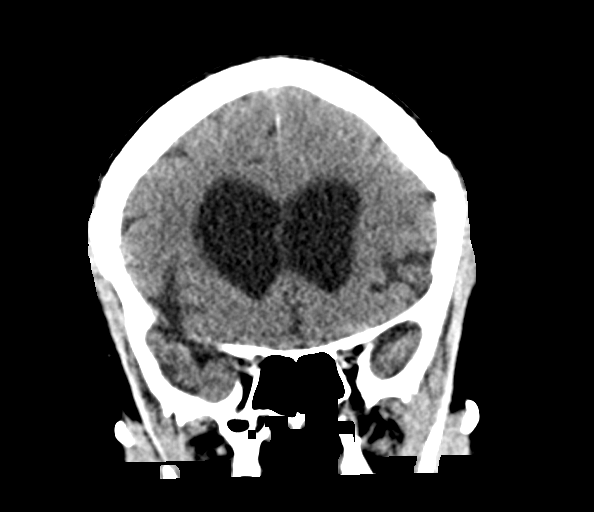
[im 34/76  brain]
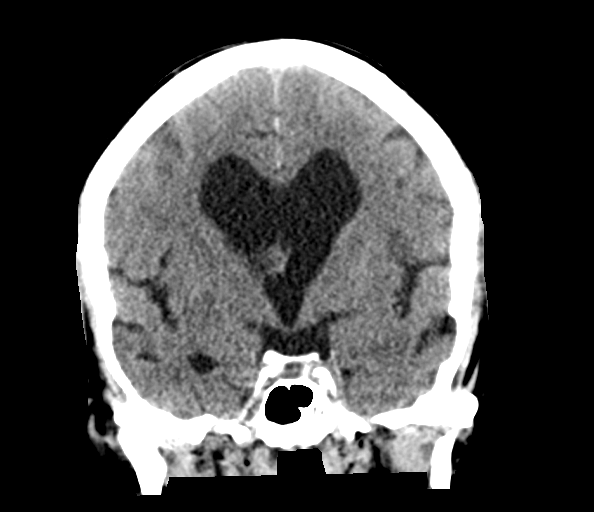
[im 42/76  brain]
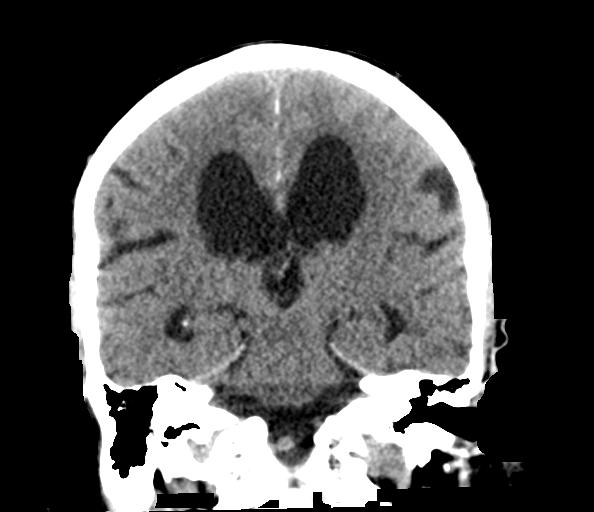

[Series 6: head 3.0 mpr sag · sagittal · 0.34mm/px · 3 of 67 slices shown]
[im 23/67  brain]
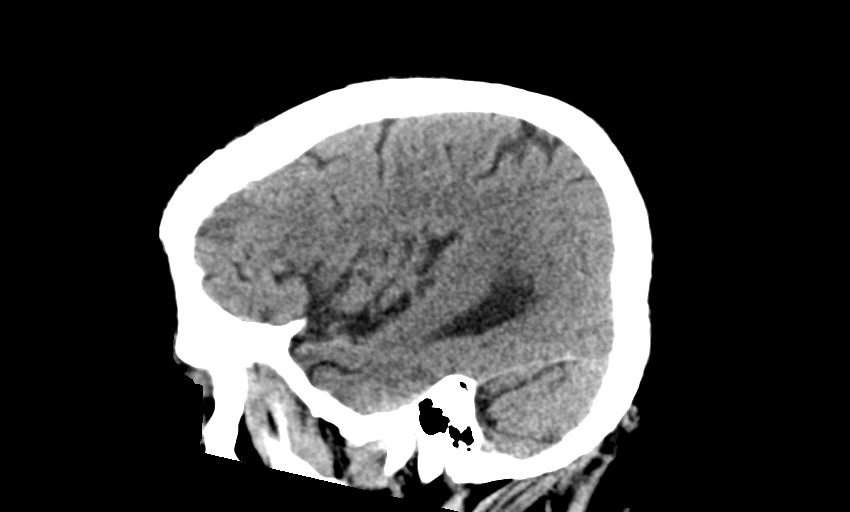
[im 34/67  brain]
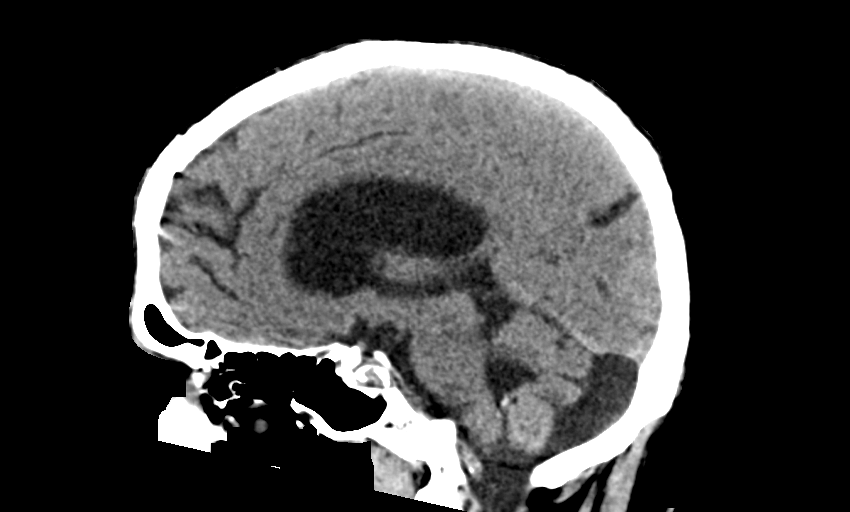
[im 45/67  brain]
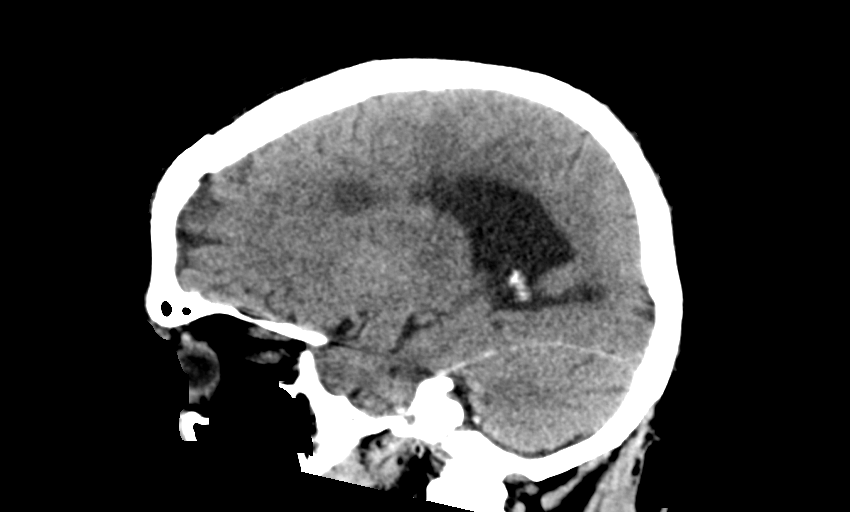

[14 of 47 positions shown; findings below may reference images not displayed]

FINDINGS: Brain: Diffuse cerebral atrophy. Ventricular dilatation is likely
due to central atrophy, although the degree of dilatation is out of
proportion to the degree of sulcal atrophy and this could also
represent normal pressure hydrocephalus in the appropriate clinical
setting. No mass effect or midline shift. No abnormal extra-axial
fluid collections. Gray-white matter junctions are distinct. Basal
cisterns are not effaced. No acute intracranial hemorrhage.

Vascular: Moderate intracranial arterial calcifications.

Skull: Calvarium appears intact. No acute depressed skull fractures.

Sinuses/Orbits: Paranasal sinuses and mastoid air cells are clear.

Other: None.
IMPRESSION: 1. No acute intracranial abnormalities. Chronic atrophy and small
vessel ischemic changes.
2. Ventricular dilatation is somewhat out of proportion to the
degree of sulcal atrophy. Possible normal pressure hydrocephalus
versus prominent central atrophy.

## 2020-09-16 DEATH — deceased

## 2020-12-04 NOTE — Progress Notes (Deleted)
NEUROLOGY FOLLOW UP OFFICE NOTE  William [name] [id number]  Assessment/Plan:   Normal Pressure Hydrocephalus, now s/p VP Shunt.  ***  Subjective:  William [name] is a 70 year old right-handed male with HTN, OSA, lumbar spinal stenosis and history of bladder cancer and left-sided Bell's palsy who follows up for NPH. She is accompanied by her daughter who provides collateral history.    UPDATE: Underwent high-volume LP in January which demonstrated significant improvement in gait following LP.  He was referred to [facility] where he underwent implant of VP shunt ***   HISTORY: He was admitted to [facility] in November 2021 for dizziness and unwitnessed fall at home.  CT head showed evidence of ventriculomegaly out of proportion to cerebral atrophy, possibly indicative of NPH.  MRI of brain showed no acute infarct.  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  2D echo showed EF 60-65%.  Dizziness was thought to possibly be due to a reaction from the COVID booster.  Due to CT findings, it was recommended that he be evaluated for possible NPH.  He has history of falls.  He has had knee surgery.  He also has lumbar spinal stenosis with 2 back surgeries and continues to get injections.  He is independent.  He lives alone, drives short distances without problems and manages his finances.  He denies memory problems.  He has history of bladder cancer and has urinary retention.  Other than once in awhile, he denies urinary incontinence.  He is starting physical therapy tomorrow.     Prior MRI of brain without contrast on 08/26/2019 showed ventriculomegaly increased since 2013 with no transependymal edema but possible hyperdynamic flow in the cerebral aqueduct.  He did have a previous head CT on 07/13/2018, which was personally reviewed and did demonstrate ventricular dilatation.  PAST MEDICAL HISTORY: Past Medical History:  Diagnosis Date   Arthritis    OA- knees    Bell's  palsy 2014   Bladder cancer (HCC)    BPH (benign prostatic hyperplasia)    GERD (gastroesophageal reflux disease)    Gout    Hypertension    Sleep apnea    CPAP-in use q night, last study 5 yrs. ago   Spinal stenosis of lumbar region with radiculopathy     MEDICATIONS: Current Outpatient Medications on File Prior to Visit  Medication Sig Dispense Refill   acetaminophen (TYLENOL) 325 MG tablet Take 1-2 tablets (325-650 mg total) by mouth every 4 (four) hours as needed for mild pain.     allopurinol (ZYLOPRIM) 100 MG tablet Take 100 mg by mouth daily.     amLODipine (NORVASC) 5 MG tablet Take 5 mg by mouth daily.   3   amoxicillin (AMOXIL) 500 MG capsule Take 2,000 mg by mouth See admin instructions. Before Dental procedures     atorvastatin (LIPITOR) 10 MG tablet Take 10 mg by mouth daily.  1   Chelated Magnesium 100 MG TABS Take 400 mg by mouth 2 (two) times daily.     Coenzyme Q10 (COQ10) 30 MG CAPS Take 1 capsule by mouth daily.     diphenhydramine-acetaminophen (TYLENOL PM) 25-500 MG TABS tablet Take 2 tablets by mouth at bedtime.     Ensure Max Protein (ENSURE MAX PROTEIN) LIQD Take 330 mLs (11 oz total) by mouth 2 (two) times daily.     Flaxseed, Linseed, (FLAXSEED OIL PO) Take 2,000 mg by mouth 2 (two) times daily.  gabapentin (NEURONTIN) 100 MG capsule Take 100 mg by mouth 3 (three) times daily. (Patient not taking: Reported on 05/31/2020)     hydrochlorothiazide (MICROZIDE) 12.5 MG capsule Take 25 mg by mouth daily.      insulin aspart (NOVOLOG FLEXPEN) 100 UNIT/ML FlexPen 0-15 Units, Subcutaneous, 3 times daily with meals CBG < 70: implement hypoglycemia protocol-call MD CBG 70 - 120: 0 units CBG 121 - 150: 2 units CBG 151 - 200: 3 units CBG 201 - 250: 5 units CBG 251 - 300: 8 units CBG 301 - 350: 11 units CBG 351 - 400: 15 units CBG > 400: (Patient not taking: Reported on 05/31/2020) 15 mL 0   insulin detemir (LEVEMIR) 100 UNIT/ML injection Inject 0.14 mLs (14 Units  total) into the skin 2 (two) times daily. (Patient not taking: Reported on 05/31/2020) 10 mL 11   loratadine (CLARITIN) 10 MG tablet Take 10 mg by mouth daily.     meloxicam (MOBIC) 15 MG tablet Take 15 mg by mouth daily as needed for pain.      metFORMIN (GLUCOPHAGE-XR) 500 MG 24 hr tablet Take 1,000 mg by mouth every morning.     metoprolol succinate (TOPROL-XL) 100 MG 24 hr tablet Take 100 mg by mouth daily.  4   Multiple Vitamin (MULTIVITAMIN WITH MINERALS) TABS tablet Take 1 tablet by mouth daily.     Omega-3 Fatty Acids (FISH OIL) 1200 MG CAPS Take 2,400 mg by mouth 2 (two) times daily.      pantoprazole (PROTONIX) 40 MG tablet Take 1 tablet (40 mg total) by mouth daily before breakfast.  3   polyethylene glycol (MIRALAX / GLYCOLAX) 17 g packet Take 17 g by mouth daily as needed for mild constipation. (Patient not taking: Reported on 05/31/2020) 14 each 0   polyvinyl alcohol (LIQUIFILM TEARS) 1.4 % ophthalmic solution Place 2 drops into both eyes 3 (three) times daily as needed (for dry eyes). 15 mL 0   tamsulosin (FLOMAX) 0.4 MG CAPS capsule Take 0.4 mg by mouth 2 (two) times daily.  3   telmisartan (MICARDIS) 40 MG tablet Take 40 mg by mouth daily. for high blood pressure     Turmeric Curcumin 500 MG CAPS Take 500 mg by mouth 2 (two) times daily.     vitamin C (ASCORBIC ACID) 500 MG tablet Take 500 mg by mouth daily.     No current facility-administered medications on file prior to visit.    ALLERGIES: Allergies  Allergen Reactions   Sulfa Antibiotics Other (See Comments)    UNSPECIFIED REACTION    Sulfasalazine Dermatitis    UNSPECIFIED SEVERITY REACTION     FAMILY HISTORY: Family History  Problem Relation Age of Onset   Cancer Mother    Congestive Heart Failure Father       Objective:  *** General: No acute distress.  Patient appears ***-groomed.   Head:  Normocephalic/atraumatic Eyes:  Fundi examined but not visualized Neck: supple, no paraspinal tenderness, full  range of motion Heart:  Regular rate and rhythm Lungs:  Clear to auscultation bilaterally Back: No paraspinal tenderness Neurological Exam: alert and oriented to person, place, and time.  Speech fluent and not dysarthric, language intact.  CN II-XII intact. Bulk and tone normal, muscle strength 5/5 throughout.  Sensation to light touch intact.  Deep tendon reflexes 2+ throughout, toes downgoing.  Finger to nose testing intact.  Gait normal, Romberg negative.   Metta Clines, DO  CC: Mila Palmer, MD

## 2020-12-06 ENCOUNTER — Ambulatory Visit: Payer: Federal, State, Local not specified - PPO | Admitting: Neurology

## 2021-07-03 ENCOUNTER — Other Ambulatory Visit: Payer: Self-pay | Admitting: Neurosurgery

## 2021-07-03 ENCOUNTER — Other Ambulatory Visit (HOSPITAL_COMMUNITY): Payer: Self-pay | Admitting: Neurosurgery

## 2021-07-03 DIAGNOSIS — Z982 Presence of cerebrospinal fluid drainage device: Secondary | ICD-10-CM

## 2021-07-18 ENCOUNTER — Ambulatory Visit
Admission: RE | Admit: 2021-07-18 | Discharge: 2021-07-18 | Disposition: A | Discharge status: Home / Self Care | Payer: Federal, State, Local not specified - PPO | Source: Ambulatory Visit | Attending: Neurosurgery | Admitting: Neurosurgery

## 2021-07-18 ENCOUNTER — Other Ambulatory Visit: Payer: Self-pay

## 2021-07-18 DIAGNOSIS — Z982 Presence of cerebrospinal fluid drainage device: Secondary | ICD-10-CM | POA: Insufficient documentation

## 2021-07-18 MED ORDER — GADOBUTROL 1 MMOL/ML IV SOLN
9.0000 mL | Freq: Once | INTRAVENOUS | Status: AC | PRN
  Administered 2021-07-18: 9 mL via INTRAVENOUS

## 2024-06-08 ENCOUNTER — Other Ambulatory Visit: Payer: Self-pay | Admitting: Orthopedic Surgery

## 2024-06-10 ENCOUNTER — Encounter (HOSPITAL_BASED_OUTPATIENT_CLINIC_OR_DEPARTMENT_OTHER): Payer: Self-pay | Admitting: Orthopedic Surgery

## 2024-06-15 ENCOUNTER — Encounter (HOSPITAL_BASED_OUTPATIENT_CLINIC_OR_DEPARTMENT_OTHER)
Admission: RE | Admit: 2024-06-15 | Discharge: 2024-06-15 | Disposition: A | Discharge status: Home / Self Care | Source: Ambulatory Visit | Attending: Orthopedic Surgery | Admitting: Orthopedic Surgery

## 2024-06-15 DIAGNOSIS — E119 Type 2 diabetes mellitus without complications: Secondary | ICD-10-CM | POA: Insufficient documentation

## 2024-06-15 DIAGNOSIS — Z01812 Encounter for preprocedural laboratory examination: Secondary | ICD-10-CM | POA: Diagnosis present

## 2024-06-15 LAB — BASIC METABOLIC PANEL WITH GFR
Anion gap: 13 (ref 5–15)
BUN: 26 mg/dL — ABNORMAL HIGH (ref 8–23)
CO2: 22 mmol/L (ref 22–32)
Calcium: 10.2 mg/dL (ref 8.9–10.3)
Chloride: 104 mmol/L (ref 98–111)
Creatinine, Ser: 1.06 mg/dL (ref 0.61–1.24)
GFR, Estimated: >60 mL/min
Glucose, Bld: 134 mg/dL — ABNORMAL HIGH (ref 70–99)
Potassium: 4.2 mmol/L (ref 3.5–5.1)
Sodium: 139 mmol/L (ref 135–145)

## 2024-06-15 NOTE — Progress Notes (Signed)

## 2024-06-17 ENCOUNTER — Ambulatory Visit (HOSPITAL_BASED_OUTPATIENT_CLINIC_OR_DEPARTMENT_OTHER)
Admission: RE | Admit: 2024-06-17 | Discharge: 2024-06-17 | Disposition: A | Discharge status: Home / Self Care | Attending: Orthopedic Surgery | Admitting: Orthopedic Surgery

## 2024-06-17 ENCOUNTER — Ambulatory Visit (HOSPITAL_BASED_OUTPATIENT_CLINIC_OR_DEPARTMENT_OTHER): Admitting: Anesthesiology

## 2024-06-17 ENCOUNTER — Encounter (HOSPITAL_BASED_OUTPATIENT_CLINIC_OR_DEPARTMENT_OTHER): Payer: Self-pay | Admitting: Orthopedic Surgery

## 2024-06-17 ENCOUNTER — Encounter (HOSPITAL_BASED_OUTPATIENT_CLINIC_OR_DEPARTMENT_OTHER)
Admission: RE | Disposition: A | Discharge status: Home / Self Care | Payer: Self-pay | Source: Home / Self Care | Attending: Orthopedic Surgery

## 2024-06-17 ENCOUNTER — Other Ambulatory Visit: Payer: Self-pay

## 2024-06-17 DIAGNOSIS — K219 Gastro-esophageal reflux disease without esophagitis: Secondary | ICD-10-CM | POA: Diagnosis not present

## 2024-06-17 DIAGNOSIS — M65341 Trigger finger, right ring finger: Secondary | ICD-10-CM | POA: Insufficient documentation

## 2024-06-17 DIAGNOSIS — Z7984 Long term (current) use of oral hypoglycemic drugs: Secondary | ICD-10-CM | POA: Insufficient documentation

## 2024-06-17 DIAGNOSIS — M1990 Unspecified osteoarthritis, unspecified site: Secondary | ICD-10-CM | POA: Insufficient documentation

## 2024-06-17 DIAGNOSIS — Z8551 Personal history of malignant neoplasm of bladder: Secondary | ICD-10-CM | POA: Insufficient documentation

## 2024-06-17 DIAGNOSIS — G4730 Sleep apnea, unspecified: Secondary | ICD-10-CM | POA: Insufficient documentation

## 2024-06-17 DIAGNOSIS — Z87891 Personal history of nicotine dependence: Secondary | ICD-10-CM

## 2024-06-17 DIAGNOSIS — I10 Essential (primary) hypertension: Secondary | ICD-10-CM | POA: Diagnosis not present

## 2024-06-17 DIAGNOSIS — M65321 Trigger finger, right index finger: Secondary | ICD-10-CM | POA: Diagnosis present

## 2024-06-17 DIAGNOSIS — M65322 Trigger finger, left index finger: Secondary | ICD-10-CM | POA: Diagnosis not present

## 2024-06-17 DIAGNOSIS — E119 Type 2 diabetes mellitus without complications: Secondary | ICD-10-CM

## 2024-06-17 DIAGNOSIS — Z8249 Family history of ischemic heart disease and other diseases of the circulatory system: Secondary | ICD-10-CM | POA: Diagnosis not present

## 2024-06-17 DIAGNOSIS — M65331 Trigger finger, right middle finger: Secondary | ICD-10-CM | POA: Insufficient documentation

## 2024-06-17 HISTORY — DX: Transient cerebral ischemic attack, unspecified: G45.9

## 2024-06-17 LAB — GLUCOSE, CAPILLARY
Glucose-Capillary: 106 mg/dL — ABNORMAL HIGH (ref 70–99)
Glucose-Capillary: 119 mg/dL — ABNORMAL HIGH (ref 70–99)

## 2024-06-17 MED ORDER — FENTANYL CITRATE (PF) 100 MCG/2ML IJ SOLN: 25.0000 ug | INTRAMUSCULAR | Status: DC | PRN

## 2024-06-17 MED ORDER — ONDANSETRON HCL 4 MG/2ML IJ SOLN
INTRAMUSCULAR | Status: DC | PRN
  Administered 2024-06-17: 4 mg via INTRAVENOUS

## 2024-06-17 MED ORDER — PROPOFOL 10 MG/ML IV BOLUS
INTRAVENOUS | Status: DC | PRN
  Administered 2024-06-17: 150 mg via INTRAVENOUS

## 2024-06-17 MED ORDER — ONDANSETRON HCL 4 MG/2ML IJ SOLN: 4.0000 mg | Freq: Four times a day (QID) | INTRAMUSCULAR | Status: DC | PRN

## 2024-06-17 MED ORDER — CEFAZOLIN SODIUM-DEXTROSE 2-4 GM/100ML-% IV SOLN
INTRAVENOUS | Status: AC
  Filled 2024-06-17: qty 100

## 2024-06-17 MED ORDER — FENTANYL CITRATE (PF) 100 MCG/2ML IJ SOLN
INTRAMUSCULAR | Status: DC | PRN
  Administered 2024-06-17: 50 ug via INTRAVENOUS
  Administered 2024-06-17 (×2): 25 ug via INTRAVENOUS

## 2024-06-17 MED ORDER — BUPIVACAINE HCL (PF) 0.25 % IJ SOLN
INTRAMUSCULAR | Status: DC | PRN
  Administered 2024-06-17: 9 mL

## 2024-06-17 MED ORDER — BUPIVACAINE HCL (PF) 0.25 % IJ SOLN
INTRAMUSCULAR | Status: AC
  Filled 2024-06-17: qty 30

## 2024-06-17 MED ORDER — HYDROCODONE-ACETAMINOPHEN 5-325 MG PO TABS: 1.0000 | ORAL_TABLET | Freq: Four times a day (QID) | ORAL | 0 refills | Status: AC | PRN

## 2024-06-17 MED ORDER — OXYCODONE HCL 5 MG/5ML PO SOLN: 5.0000 mg | Freq: Once | ORAL | Status: DC | PRN

## 2024-06-17 MED ORDER — CEFAZOLIN SODIUM-DEXTROSE 2-4 GM/100ML-% IV SOLN
2.0000 g | INTRAVENOUS | Status: AC
  Administered 2024-06-17: 2 g via INTRAVENOUS

## 2024-06-17 MED ORDER — OXYCODONE HCL 5 MG PO TABS: 5.0000 mg | ORAL_TABLET | Freq: Once | ORAL | Status: DC | PRN

## 2024-06-17 MED ORDER — FENTANYL CITRATE (PF) 100 MCG/2ML IJ SOLN
INTRAMUSCULAR | Status: AC
  Filled 2024-06-17: qty 2

## 2024-06-17 MED ORDER — LIDOCAINE HCL (CARDIAC) PF 100 MG/5ML IV SOSY
PREFILLED_SYRINGE | INTRAVENOUS | Status: DC | PRN
  Administered 2024-06-17: 50 mg via INTRAVENOUS

## 2024-06-17 MED ORDER — 0.9 % SODIUM CHLORIDE (POUR BTL) OPTIME
TOPICAL | Status: DC | PRN
  Administered 2024-06-17: 1000 mL

## 2024-06-17 MED ORDER — LACTATED RINGERS IV SOLN: INTRAVENOUS | Status: DC

## 2024-06-17 MED ORDER — EPHEDRINE SULFATE (PRESSORS) 25 MG/5ML IV SOSY
PREFILLED_SYRINGE | INTRAVENOUS | Status: DC | PRN
  Administered 2024-06-17 (×2): 10 mg via INTRAVENOUS

## 2024-06-17 NOTE — Transfer of Care (Signed)
 Immediate Anesthesia Transfer of Care Note  Patient: William [name]  Procedure(s) Performed: RELEASE, A1 PULLEY, FOR TRIGGER FINGER (Right: Finger)  Patient Location: PACU  Anesthesia Type:General  Level of Consciousness: awake, alert , oriented, drowsy, and patient cooperative  Airway & Oxygen Therapy: Patient Spontanous Breathing and Patient connected to face mask oxygen  Post-op Assessment: Report given to RN and Post -op Vital signs reviewed and stable  Post vital signs: Reviewed and stable  Last Vitals:  Vitals Value Taken Time  BP    Temp    Pulse 79 06/17/24 13:41  Resp 15 06/17/24 13:41  SpO2 93 % 06/17/24 13:41  Vitals shown include unfiled device data.  Last Pain:  Vitals:   06/17/24 1117  TempSrc: Temporal  PainSc: 7       Patients Stated Pain Goal: 6 (06/17/24 1117)  Complications: No notable events documented.

## 2024-06-17 NOTE — Anesthesia Preprocedure Evaluation (Signed)
"                                    Anesthesia Evaluation  Patient identified by MRN, date of birth, ID band Patient awake    Reviewed: Allergy & Precautions, H&P , NPO status , Patient's Chart, lab work & pertinent test results  Airway Mallampati: II   Neck ROM: full    Dental   Pulmonary sleep apnea , former smoker   breath sounds clear to auscultation       Cardiovascular hypertension,  Rhythm:regular Rate:Normal     Neuro/Psych    GI/Hepatic ,GERD  ,,  Endo/Other  diabetes    Renal/GU    H/o bladder CA    Musculoskeletal  (+) Arthritis ,    Abdominal   Peds  Hematology   Anesthesia Other Findings   Reproductive/Obstetrics                              Anesthesia Physical Anesthesia Plan  ASA: 3  Anesthesia Plan: General   Post-op Pain Management:    Induction: Intravenous  PONV Risk Score and Plan: 2 and Ondansetron , Treatment may vary due to age or medical condition and Dexamethasone   Airway Management Planned: LMA  Additional Equipment:   Intra-op Plan:   Post-operative Plan: Extubation in OR  Informed Consent: I have reviewed the patients History and Physical, chart, labs and discussed the procedure including the risks, benefits and alternatives for the proposed anesthesia with the patient or authorized representative who has indicated his/her understanding and acceptance.     Dental advisory given  Plan Discussed with: CRNA, Anesthesiologist and Surgeon  Anesthesia Plan Comments:          Anesthesia Quick Evaluation  "

## 2024-06-17 NOTE — Anesthesia Procedure Notes (Signed)
 Procedure Name: LMA Insertion Date/Time: 06/17/2024 1:01 PM  Performed by: Emilio Rock BIRCH, CRNAPre-anesthesia Checklist: Patient identified, Emergency Drugs available, Suction available and Patient being monitored Patient Re-evaluated:Patient Re-evaluated prior to induction Oxygen Delivery Method: Circle System Utilized Preoxygenation: Pre-oxygenation with 100% oxygen Induction Type: IV induction Ventilation: Mask ventilation without difficulty LMA: LMA inserted LMA Size: 4.0 Number of attempts: 1 Airway Equipment and Method: bite block Placement Confirmation: positive ETCO2 Tube secured with: Tape Dental Injury: Teeth and Oropharynx as per pre-operative assessment

## 2024-06-17 NOTE — H&P (Signed)
 William [name] is an 74 y.o. male.   Chief Complaint: trigger digits HPI: 74 y.o. yo male with triggering of right index finger, long finger, ring finger.  This has been injected without lasting resolution.  He wishes to proceed with surgical trigger release.   Allergies: Allergies[1]  Past Medical History:  Diagnosis Date   Arthritis    OA- knees    Bell's palsy 2014   Bladder cancer (HCC)    BPH (benign prostatic hyperplasia)    GERD (gastroesophageal reflux disease)    Gout    Hypertension    Sleep apnea    CPAP-in use q night, last study 5 yrs. ago   Spinal stenosis of lumbar region with radiculopathy    TIA (transient ischemic attack)     Past Surgical History:  Procedure Laterality Date   BACK SURGERY  2023   [facility]   BLADDER TUMOR EXCISION  2017   several cystocscopy for the excision followed by M. Shona /w [facility]   BLEPHAROPLASTY Bilateral 2017   in [place]   CYSTOSCOPY     HEMATOMA EVACUATION N/A 07/04/2018   Procedure: Lumbar wound exploration and EVACUATION OF EPIDURAL HEMATOMA;  Surgeon: Mavis Purchase, MD;  Location: [facility] OR;  Service: Neurosurgery;  Laterality: N/A;   JOINT REPLACEMENT     KNEE ARTHROSCOPY     2 on each side, one being a repair of the meniscus      laminectomy  06/12/2022   [facility]   LUMBAR LAMINECTOMY/DECOMPRESSION MICRODISCECTOMY N/A 02/24/2018   Procedure: Lumbar Four-Five Laminectomy/Foraminotomy;  Surgeon: Gillie Duncans, MD;  Location: [facility] OR;  Service: Neurosurgery;  Laterality: N/A;   SHOULDER ARTHROSCOPY Right 2016   TONSILLECTOMY     TOTAL KNEE ARTHROPLASTY Right 12/08/2016   Procedure: TOTAL KNEE ARTHROPLASTY;  Surgeon: Rubie Kemps, MD;  Location: [facility] OR;  Service: Orthopedics;  Laterality: Right;   VENTRICULOPERITONEAL SHUNT Right 08/09/2020   [facility]    Family History: Family History  Problem Relation Age of Onset   Cancer Mother    Congestive Heart Failure Father     Social History:   reports that he quit smoking  about 21 years ago. His smoking use included cigarettes. He has never used smokeless tobacco. He reports that he does not currently use alcohol  after a past usage of about 2.0 - 3.0 standard drinks of alcohol  per week. He reports that he does not use drugs.  Medications: Medications Prior to Admission  Medication Sig Dispense Refill   acetaminophen  (TYLENOL ) 325 MG tablet Take 1-2 tablets (325-650 mg total) by mouth every 4 (four) hours as needed for mild pain.     allopurinol  (ZYLOPRIM ) 100 MG tablet Take 100 mg by mouth daily.     amLODipine  (NORVASC ) 5 MG tablet Take 5 mg by mouth daily.   3   atorvastatin  (LIPITOR) 10 MG tablet Take 10 mg by mouth daily.  1   Chelated Magnesium  100 MG TABS Take 400 mg by mouth 2 (two) times daily.     Coenzyme Q10 (COQ10) 30 MG CAPS Take 1 capsule by mouth daily.     dapagliflozin propanediol (FARXIGA) 10 MG TABS tablet Take 10 mg by mouth daily.     diphenhydramine -acetaminophen  (TYLENOL  PM) 25-500 MG TABS tablet Take 2 tablets by mouth at bedtime.     Flaxseed, Linseed, (FLAXSEED OIL PO) Take 2,000 mg by mouth 2 (two) times daily.      hydrochlorothiazide  (MICROZIDE ) 12.5 MG capsule Take 25 mg by mouth daily.  loratadine  (CLARITIN ) 10 MG tablet Take 10 mg by mouth daily.     metFORMIN  (GLUCOPHAGE -XR) 500 MG 24 hr tablet Take 1,000 mg by mouth every morning.     metoprolol  succinate (TOPROL -XL) 100 MG 24 hr tablet Take 100 mg by mouth daily.  4   Multiple Vitamin (MULTIVITAMIN WITH MINERALS) TABS tablet Take 1 tablet by mouth daily.     Omega-3 Fatty Acids (FISH OIL) 1200 MG CAPS Take 2,400 mg by mouth 2 (two) times daily.      pantoprazole  (PROTONIX ) 40 MG tablet Take 1 tablet (40 mg total) by mouth daily before breakfast.  3   polyvinyl alcohol  (LIQUIFILM TEARS) 1.4 % ophthalmic solution Place 2 drops into both eyes 3 (three) times daily as needed (for dry eyes). 15 mL 0   tamsulosin  (FLOMAX ) 0.4 MG CAPS capsule Take 0.4 mg by mouth 2 (two) times  daily.  3   telmisartan (MICARDIS) 40 MG tablet Take 40 mg by mouth daily. for high blood pressure     Turmeric Curcumin 500 MG CAPS Take 500 mg by mouth 2 (two) times daily.     vitamin C  (ASCORBIC ACID ) 500 MG tablet Take 500 mg by mouth daily.     amoxicillin (AMOXIL) 500 MG capsule Take 2,000 mg by mouth See admin instructions. Before Dental procedures     Ensure Max Protein (ENSURE MAX PROTEIN) LIQD Take 330 mLs (11 oz total) by mouth 2 (two) times daily.     gabapentin  (NEURONTIN ) 100 MG capsule Take 100 mg by mouth 3 (three) times daily. (Patient not taking: Reported on 05/31/2020)     insulin  aspart (NOVOLOG  FLEXPEN) 100 UNIT/ML FlexPen 0-15 Units, Subcutaneous, 3 times daily with meals CBG < 70: implement hypoglycemia protocol-call MD CBG 70 - 120: 0 units CBG 121 - 150: 2 units CBG 151 - 200: 3 units CBG 201 - 250: 5 units CBG 251 - 300: 8 units CBG 301 - 350: 11 units CBG 351 - 400: 15 units CBG > 400: (Patient not taking: Reported on 05/31/2020) 15 mL 0   insulin  detemir (LEVEMIR ) 100 UNIT/ML injection Inject 0.14 mLs (14 Units total) into the skin 2 (two) times daily. (Patient not taking: Reported on 05/31/2020) 10 mL 11   meloxicam (MOBIC) 15 MG tablet Take 15 mg by mouth daily as needed for pain.      polyethylene glycol (MIRALAX  / GLYCOLAX ) 17 g packet Take 17 g by mouth daily as needed for mild constipation. (Patient not taking: Reported on 05/31/2020) 14 each 0    Results for orders placed or performed during the hospital encounter of 06/17/24 (from the past 48 hours)  Glucose, capillary     Status: Abnormal   Collection Time: 06/17/24 11:14 AM  Result Value Ref Range   Glucose-Capillary 106 (H) 70 - 99 mg/dL    Comment: Glucose reference range applies only to samples taken after fasting for at least 8 hours.    No results found.    Blood pressure 133/75, pulse 80, temperature 97.7 F (36.5 C), temperature source Temporal, resp. rate 18, height 5' 11 (1.803 m),  weight 89 kg, SpO2 97%.  General appearance: alert, cooperative, and appears stated age Head: Normocephalic, without obvious abnormality, atraumatic Neck: supple, symmetrical, trachea midline Extremities: Intact sensation and capillary refill all digits.  +epl/fpl/io.  No wounds.  Skin: Skin color, texture, turgor normal. No rashes or lesions Neurologic: Grossly normal Incision/Wound: none  Assessment/Plan Right index, long, ring finger trigger digits.  Non  operative and operative treatment options have been discussed with the patient and patient wishes to proceed with operative treatment. Risks, benefits, and alternatives of surgery have been discussed and the patient agrees with the plan of care.   William [name] 06/17/2024, 12:41 PM      [1]  Allergies Allergen Reactions   Sulfa Antibiotics Other (See Comments)    UNSPECIFIED REACTION    Sulfasalazine Dermatitis    UNSPECIFIED SEVERITY REACTION

## 2024-06-17 NOTE — Discharge Instructions (Addendum)

## 2024-06-17 NOTE — Op Note (Signed)
 06/17/2024 [facility]  Operative Note  PREOPERATIVE DIAGNOSIS: RIGHT INDEX, LONG, AND RING TRIGGER DIGITS  POSTOPERATIVE DIAGNOSIS:  RIGHT INDEX, LONG, AND RING TRIGGER DIGITS  PROCEDURE: Procedures: Right index finger trigger release Right long finger trigger release Right ring finger trigger release  SURGEON:  Franky Curia, MD  ASSISTANT:  Jesse Jordan, PA-C  ANESTHESIA:  General  IV FLUIDS:  Per anesthesia flow sheet  ESTIMATED BLOOD LOSS:  Minimal  COMPLICATIONS:  None  SPECIMENS:  None  TOURNIQUET TIME:  Total Tourniquet Time Documented: Upper Arm (Right) - 17 minutes Total: Upper Arm (Right) - 17 minutes   DISPOSITION:  Stable to PACU  LOCATION:  SURGERY CENTER  INDICATIONS: William [name] is a 74 y.o. male with triggering of the index finger, long finger, ring finger.  This has been injected without lasting resolution.  He wishes to proceed with surgical trigger release.  Risks, benefits and alternatives of surgery were discussed including the risk of blood loss, infection, damage to nerves, vessels, tendons, ligaments, bone, failure of surgery, need for additional surgery, complications with wound healing, continued pain, continued triggering and need for repeat surgery.  He voiced understanding of these risks and elected to proceed.  OPERATIVE COURSE:  After being identified preoperatively by myself, the patient and I agreed upon the procedure and site of procedure.  The surgical site was marked. Surgical consent had been signed. He was given IV Ancef  as preoperative antibiotic prophylaxis. He was transported to the operating room and placed on the operating room table in supine position with the right upper extremity on an arm board. General anesthesia was induced by the anesthesiologist.  The right upper extremity was prepped and draped in normal sterile orthopedic fashion. A surgical pause was performed between surgeons, anesthesia, and  operating room staff, and all were in agreement as to the patient, procedure, and site of procedure.  Tourniquet at the proximal aspect of the extremity was inflated to 250 mmHg after exsanguination of the arm with an Esmarch bandage.  An incision was made at the volar aspect of the MP joint of the index finger.  This was carried into the subcutaneous tissues by spreading technique.  Bipolar electrocautery was used to obtain hemostasis.  The radial and ulnar digital nerves were protected throughout the case. The flexor sheath was identified.  The A1 pulley was identified and sharply incised.  It was released in its entirety.  The proximal 1-2 mm of the A2 pulley was vented to allow better excursion of the tendons.  The finger was placed through a range of motion and there was noted to be no catching.  The tendons were brought through the wound and any adherences released.  An incision was made at the volar aspect of the MP joint of the long finger.  This was carried into the subcutaneous tissues by spreading technique.  Bipolar electrocautery was used to obtain hemostasis.  The radial and ulnar digital nerves were protected throughout the case. The flexor sheath was identified.  The A1 pulley was identified and sharply incised.  It was released in its entirety.  The proximal 1-2 mm of the A2 pulley was vented to allow better excursion of the tendons.  The finger was placed through a range of motion and there was noted to be no catching.  The tendons were brought through the wound and any adherences released.  An incision was made at the volar aspect of the MP joint of the ring finger.  This was carried into the subcutaneous tissues by spreading technique.  Bipolar electrocautery was used to obtain hemostasis.  The radial and ulnar digital nerves were protected throughout the case. The flexor sheath was identified.  The A1 pulley was identified and sharply incised.  It was released in its entirety.  The proximal 1-2  mm of the A2 pulley was vented to allow better excursion of the tendons.  The finger was placed through a range of motion and there was noted to be no catching.  The tendons were brought through the wound and any adherences released.  The wound was then copiously irrigated with sterile saline. It was closed with 4-0 nylon in a horizontal mattress fashion.  It was injected with 0.25% plain Marcaine  to aid in postoperative analgesia.  It was dressed with sterile Xeroform, 4x4s, and wrapped lightly with a Coban dressing.  Tourniquet was deflated at 17 minutes.  The fingertips were pink with brisk capillary refill after deflation of the tourniquet.  The operative drapes were broken down and the patient was awoken from anesthesia safely.  He was transferred back to the stretcher and taken to the PACU in stable condition.   I will see him back in the office in 1 week for postoperative followup.  I will give him a prescription for Norco 5/325 1 tab PO q6 hours prn pain, dispense #15.    Asif Muchow, MD Electronically signed, 06/17/24

## 2024-06-20 ENCOUNTER — Encounter (HOSPITAL_BASED_OUTPATIENT_CLINIC_OR_DEPARTMENT_OTHER): Payer: Self-pay | Admitting: Orthopedic Surgery
